# Patient Record
Sex: Male | Born: 1950
Health system: Southern US, Community
[De-identification: ages and names within clinical notes are randomized; demographics above are authoritative.]

## PROBLEM LIST (undated history)

## (undated) DIAGNOSIS — Z87442 Personal history of urinary calculi: Secondary | ICD-10-CM

## (undated) DIAGNOSIS — Z860100 Personal history of colon polyps, unspecified: Secondary | ICD-10-CM

## (undated) DIAGNOSIS — Z8709 Personal history of other diseases of the respiratory system: Secondary | ICD-10-CM

## (undated) DIAGNOSIS — M1990 Unspecified osteoarthritis, unspecified site: Secondary | ICD-10-CM

## (undated) DIAGNOSIS — G8929 Other chronic pain: Secondary | ICD-10-CM

## (undated) DIAGNOSIS — M549 Dorsalgia, unspecified: Secondary | ICD-10-CM

## (undated) DIAGNOSIS — F419 Anxiety disorder, unspecified: Secondary | ICD-10-CM

## (undated) DIAGNOSIS — I10 Essential (primary) hypertension: Secondary | ICD-10-CM

## (undated) DIAGNOSIS — E785 Hyperlipidemia, unspecified: Secondary | ICD-10-CM

## (undated) DIAGNOSIS — Z9889 Other specified postprocedural states: Secondary | ICD-10-CM

## (undated) DIAGNOSIS — R112 Nausea with vomiting, unspecified: Secondary | ICD-10-CM

## (undated) DIAGNOSIS — R972 Elevated prostate specific antigen [PSA]: Secondary | ICD-10-CM

## (undated) DIAGNOSIS — C61 Malignant neoplasm of prostate: Secondary | ICD-10-CM

## (undated) HISTORY — PX: KNEE ARTHROSCOPY W/ ACL RECONSTRUCTION: SHX1858

## (undated) HISTORY — PX: SHOULDER SURGERY: SHX246

## (undated) HISTORY — PX: TONSILLECTOMY: SUR1361

## (undated) HISTORY — PX: COLONOSCOPY: SHX174

## (undated) HISTORY — PX: KNEE ARTHROSCOPY: SUR90

## (undated) HISTORY — PX: OTHER SURGICAL HISTORY: SHX169

## (undated) HISTORY — PX: COLONOSCOPY W/ POLYPECTOMY: SHX1380

---

## 2002-10-08 ENCOUNTER — Encounter: Payer: Self-pay | Admitting: Occupational Medicine

## 2002-10-08 ENCOUNTER — Encounter: Admission: RE | Admit: 2002-10-08 | Discharge: 2002-10-08 | Payer: Self-pay | Admitting: Occupational Medicine

## 2006-01-15 ENCOUNTER — Ambulatory Visit: Payer: Self-pay | Admitting: Orthopaedic Surgery

## 2006-02-28 ENCOUNTER — Ambulatory Visit: Payer: Self-pay | Admitting: Orthopaedic Surgery

## 2007-07-15 ENCOUNTER — Ambulatory Visit: Payer: Self-pay | Admitting: Gastroenterology

## 2012-06-16 ENCOUNTER — Emergency Department: Payer: Self-pay | Admitting: Unknown Physician Specialty

## 2012-06-16 LAB — URINALYSIS, COMPLETE
Blood: NEGATIVE
Leukocyte Esterase: NEGATIVE
Nitrite: NEGATIVE
Protein: NEGATIVE
Specific Gravity: 1.011 (ref 1.003–1.030)

## 2012-06-16 LAB — COMPREHENSIVE METABOLIC PANEL
Albumin: 4 g/dL (ref 3.4–5.0)
Alkaline Phosphatase: 86 U/L (ref 50–136)
Anion Gap: 9 (ref 7–16)
BUN: 15 mg/dL (ref 7–18)
Co2: 25 mmol/L (ref 21–32)
Glucose: 103 mg/dL — ABNORMAL HIGH (ref 65–99)
Osmolality: 280 (ref 275–301)
Potassium: 3.8 mmol/L (ref 3.5–5.1)
SGOT(AST): 30 U/L (ref 15–37)
Sodium: 140 mmol/L (ref 136–145)
Total Protein: 7.4 g/dL (ref 6.4–8.2)

## 2012-06-16 LAB — CBC
HCT: 44.6 % (ref 40.0–52.0)
HGB: 14.8 g/dL (ref 13.0–18.0)
MCHC: 33.1 g/dL (ref 32.0–36.0)
RBC: 4.79 10*6/uL (ref 4.40–5.90)
RDW: 13.3 % (ref 11.5–14.5)
WBC: 5.6 10*3/uL (ref 3.8–10.6)

## 2012-06-16 LAB — TROPONIN I: Troponin-I: <0.02 ng/mL

## 2012-08-05 ENCOUNTER — Encounter (HOSPITAL_COMMUNITY): Payer: Self-pay | Admitting: Pharmacy Technician

## 2012-08-05 ENCOUNTER — Other Ambulatory Visit: Payer: Self-pay | Admitting: Neurosurgery

## 2012-08-08 ENCOUNTER — Encounter (HOSPITAL_COMMUNITY)
Admission: RE | Admit: 2012-08-08 | Discharge: 2012-08-08 | Disposition: A | Discharge status: Home / Self Care | Payer: No Typology Code available for payment source | Source: Ambulatory Visit | Attending: Neurosurgery | Admitting: Neurosurgery

## 2012-08-08 ENCOUNTER — Ambulatory Visit (HOSPITAL_COMMUNITY)
Admission: RE | Admit: 2012-08-08 | Discharge: 2012-08-08 | Disposition: A | Discharge status: Home / Self Care | Payer: No Typology Code available for payment source | Source: Ambulatory Visit | Attending: Neurosurgery | Admitting: Neurosurgery

## 2012-08-08 ENCOUNTER — Encounter (HOSPITAL_COMMUNITY): Payer: Self-pay

## 2012-08-08 DIAGNOSIS — I10 Essential (primary) hypertension: Secondary | ICD-10-CM | POA: Insufficient documentation

## 2012-08-08 DIAGNOSIS — Z01812 Encounter for preprocedural laboratory examination: Secondary | ICD-10-CM | POA: Insufficient documentation

## 2012-08-08 DIAGNOSIS — Z01810 Encounter for preprocedural cardiovascular examination: Secondary | ICD-10-CM | POA: Insufficient documentation

## 2012-08-08 DIAGNOSIS — E785 Hyperlipidemia, unspecified: Secondary | ICD-10-CM | POA: Insufficient documentation

## 2012-08-08 DIAGNOSIS — Z01818 Encounter for other preprocedural examination: Secondary | ICD-10-CM | POA: Insufficient documentation

## 2012-08-08 HISTORY — DX: Other chronic pain: G89.29

## 2012-08-08 HISTORY — DX: Nausea with vomiting, unspecified: R11.2

## 2012-08-08 HISTORY — DX: Personal history of colonic polyps: Z86.010

## 2012-08-08 HISTORY — DX: Unspecified osteoarthritis, unspecified site: M19.90

## 2012-08-08 HISTORY — DX: Personal history of urinary calculi: Z87.442

## 2012-08-08 HISTORY — DX: Personal history of colon polyps, unspecified: Z86.0100

## 2012-08-08 HISTORY — DX: Dorsalgia, unspecified: M54.9

## 2012-08-08 HISTORY — DX: Personal history of other diseases of the respiratory system: Z87.09

## 2012-08-08 HISTORY — DX: Essential (primary) hypertension: I10

## 2012-08-08 HISTORY — DX: Hyperlipidemia, unspecified: E78.5

## 2012-08-08 HISTORY — DX: Other specified postprocedural states: Z98.890

## 2012-08-08 LAB — CBC
HCT: 43.4 % (ref 39.0–52.0)
Hemoglobin: 14.9 g/dL (ref 13.0–17.0)
MCH: 30.8 pg (ref 26.0–34.0)
MCHC: 34.3 g/dL (ref 30.0–36.0)
MCV: 89.7 fL (ref 78.0–100.0)
Platelets: 194 10*3/uL (ref 150–400)
RBC: 4.84 MIL/uL (ref 4.22–5.81)
RDW: 13.4 % (ref 11.5–15.5)
WBC: 5.9 10*3/uL (ref 4.0–10.5)

## 2012-08-08 LAB — BASIC METABOLIC PANEL
BUN: 17 mg/dL (ref 6–23)
CO2: 27 mEq/L (ref 19–32)
Calcium: 9.7 mg/dL (ref 8.4–10.5)
Chloride: 104 mEq/L (ref 96–112)
Creatinine, Ser: 0.62 mg/dL (ref 0.50–1.35)
GFR calc Af Amer: >90 mL/min (ref >90)
GFR calc non Af Amer: >90 mL/min (ref >90)
Glucose, Bld: 87 mg/dL (ref 70–99)
Potassium: 3.7 mEq/L (ref 3.5–5.1)
Sodium: 142 mEq/L (ref 135–145)

## 2012-08-08 LAB — SURGICAL PCR SCREEN
MRSA, PCR: NEGATIVE
Staphylococcus aureus: POSITIVE — AB

## 2012-08-08 NOTE — Pre-Procedure Instructions (Signed)
20 Ronald Hunter  08/08/2012   Your procedure is scheduled on:  Mon, Aug 26 @ 7:30 AM  Report to [facility] at 5:30 AM.  Call this number if you have problems the morning of surgery: [phone]   Remember:   Do not eat food:After Midnight.  Take these medicines the morning of surgery with A SIP OF WATER: Metoprolol(Toprol)   Do not wear jewelry  Do not wear lotions, powders, or colognes.  Men may shave face and neck.  Do not bring valuables to the hospital.  Contacts, dentures or bridgework may not be worn into surgery.  Leave suitcase in the car. After surgery it may be brought to your room.  For patients admitted to the hospital, checkout time is 11:00 AM the day of discharge.   Patients discharged the day of surgery will not be allowed to drive home.    Special Instructions: CHG Shower Use Special Wash: 1/2 bottle night before surgery and 1/2 bottle morning of surgery.   Please read over the following fact sheets that you were given: Pain Booklet, Coughing and Deep Breathing, MRSA Information and Surgical Site Infection Prevention

## 2012-08-08 NOTE — Progress Notes (Signed)
Pt doesn't have a cardiologist  Denies ever having a stress test/echo/heart cath  Medical MD is Dr.Samuel Moriarty in [place]  No recent ekg or cxr

## 2012-08-10 MED ORDER — CEFAZOLIN SODIUM-DEXTROSE 2-3 GM-% IV SOLR
2.0000 g | INTRAVENOUS | Status: AC
  Administered 2012-08-11: 2 g via INTRAVENOUS
  Filled 2012-08-10: qty 50

## 2012-08-11 ENCOUNTER — Encounter (HOSPITAL_COMMUNITY)
Admission: RE | Disposition: A | Discharge status: Home / Self Care | Payer: Self-pay | Source: Ambulatory Visit | Attending: Neurosurgery

## 2012-08-11 ENCOUNTER — Encounter (HOSPITAL_COMMUNITY): Payer: Self-pay | Admitting: Certified Registered"

## 2012-08-11 ENCOUNTER — Encounter (HOSPITAL_COMMUNITY): Payer: Self-pay | Admitting: *Deleted

## 2012-08-11 ENCOUNTER — Ambulatory Visit (HOSPITAL_COMMUNITY): Payer: No Typology Code available for payment source | Admitting: Certified Registered"

## 2012-08-11 ENCOUNTER — Ambulatory Visit (HOSPITAL_COMMUNITY): Payer: No Typology Code available for payment source

## 2012-08-11 ENCOUNTER — Ambulatory Visit (HOSPITAL_COMMUNITY)
Admission: RE | Admit: 2012-08-11 | Discharge: 2012-08-12 | Disposition: A | Discharge status: Home / Self Care | Payer: No Typology Code available for payment source | Source: Ambulatory Visit | Attending: Neurosurgery | Admitting: Neurosurgery

## 2012-08-11 DIAGNOSIS — E785 Hyperlipidemia, unspecified: Secondary | ICD-10-CM | POA: Insufficient documentation

## 2012-08-11 DIAGNOSIS — Z79899 Other long term (current) drug therapy: Secondary | ICD-10-CM | POA: Insufficient documentation

## 2012-08-11 DIAGNOSIS — I10 Essential (primary) hypertension: Secondary | ICD-10-CM | POA: Insufficient documentation

## 2012-08-11 DIAGNOSIS — M47817 Spondylosis without myelopathy or radiculopathy, lumbosacral region: Secondary | ICD-10-CM | POA: Insufficient documentation

## 2012-08-11 DIAGNOSIS — M5126 Other intervertebral disc displacement, lumbar region: Secondary | ICD-10-CM | POA: Insufficient documentation

## 2012-08-11 DIAGNOSIS — G8929 Other chronic pain: Secondary | ICD-10-CM | POA: Insufficient documentation

## 2012-08-11 HISTORY — PX: LUMBAR LAMINECTOMY/DECOMPRESSION MICRODISCECTOMY: SHX5026

## 2012-08-11 SURGERY — LUMBAR LAMINECTOMY/DECOMPRESSION MICRODISCECTOMY 1 LEVEL
Anesthesia: General | Site: Spine Lumbar | Laterality: Right | Wound class: Clean

## 2012-08-11 MED ORDER — BISACODYL 10 MG RE SUPP: 10.0000 mg | Freq: Every day | RECTAL | Status: DC | PRN

## 2012-08-11 MED ORDER — ROCURONIUM BROMIDE 100 MG/10ML IV SOLN
INTRAVENOUS | Status: DC | PRN
  Administered 2012-08-11: 10 mg via INTRAVENOUS
  Administered 2012-08-11: 50 mg via INTRAVENOUS

## 2012-08-11 MED ORDER — MAGNESIUM HYDROXIDE 400 MG/5ML PO SUSP: 30.0000 mL | Freq: Every day | ORAL | Status: DC | PRN

## 2012-08-11 MED ORDER — METOPROLOL SUCCINATE ER 50 MG PO TB24
50.0000 mg | ORAL_TABLET | Freq: Every day | ORAL | Status: DC
  Administered 2012-08-11: 50 mg via ORAL
  Filled 2012-08-11 (×2): qty 1

## 2012-08-11 MED ORDER — KETOROLAC TROMETHAMINE 30 MG/ML IJ SOLN
30.0000 mg | Freq: Four times a day (QID) | INTRAMUSCULAR | Status: DC
  Administered 2012-08-11 – 2012-08-12 (×4): 30 mg via INTRAVENOUS
  Filled 2012-08-11 (×8): qty 1

## 2012-08-11 MED ORDER — OXYCODONE HCL 5 MG PO TABS: 5.0000 mg | ORAL_TABLET | Freq: Once | ORAL | Status: DC | PRN

## 2012-08-11 MED ORDER — PHENOL 1.4 % MT LIQD: 1.0000 | OROMUCOSAL | Status: DC | PRN

## 2012-08-11 MED ORDER — FENTANYL CITRATE 0.05 MG/ML IJ SOLN
INTRAMUSCULAR | Status: DC | PRN
  Administered 2012-08-11: 100 ug via INTRAVENOUS
  Administered 2012-08-11 (×2): 50 ug via INTRAVENOUS

## 2012-08-11 MED ORDER — OXYCODONE HCL 5 MG PO TABS: 5.0000 mg | ORAL_TABLET | ORAL | Status: DC | PRN

## 2012-08-11 MED ORDER — ACETAMINOPHEN 325 MG PO TABS: 650.0000 mg | ORAL_TABLET | ORAL | Status: DC | PRN

## 2012-08-11 MED ORDER — BACITRACIN 50000 UNITS IM SOLR
INTRAMUSCULAR | Status: AC
  Filled 2012-08-11: qty 1

## 2012-08-11 MED ORDER — GLYCOPYRROLATE 0.2 MG/ML IJ SOLN
INTRAMUSCULAR | Status: DC | PRN
  Administered 2012-08-11: 0.4 mg via INTRAVENOUS

## 2012-08-11 MED ORDER — ATORVASTATIN CALCIUM 40 MG PO TABS
40.0000 mg | ORAL_TABLET | Freq: Every day | ORAL | Status: DC
  Administered 2012-08-11: 40 mg via ORAL
  Filled 2012-08-11 (×2): qty 1

## 2012-08-11 MED ORDER — ACETAMINOPHEN 10 MG/ML IV SOLN
INTRAVENOUS | Status: AC
  Administered 2012-08-11: 1000 mg via INTRAVENOUS
  Filled 2012-08-11: qty 100

## 2012-08-11 MED ORDER — HEMOSTATIC AGENTS (NO CHARGE) OPTIME
TOPICAL | Status: DC | PRN
  Administered 2012-08-11: 1 via TOPICAL

## 2012-08-11 MED ORDER — 0.9 % SODIUM CHLORIDE (POUR BTL) OPTIME
TOPICAL | Status: DC | PRN
  Administered 2012-08-11: 1000 mL

## 2012-08-11 MED ORDER — KCL IN DEXTROSE-NACL 20-5-0.45 MEQ/L-%-% IV SOLN
INTRAVENOUS | Status: DC
  Filled 2012-08-11 (×5): qty 1000

## 2012-08-11 MED ORDER — NEOSTIGMINE METHYLSULFATE 1 MG/ML IJ SOLN
INTRAMUSCULAR | Status: DC | PRN
  Administered 2012-08-11: 4 mg via INTRAVENOUS

## 2012-08-11 MED ORDER — HYDROMORPHONE HCL PF 1 MG/ML IJ SOLN
0.2500 mg | INTRAMUSCULAR | Status: DC | PRN
  Administered 2012-08-11: 0.5 mg via INTRAVENOUS

## 2012-08-11 MED ORDER — HYDROCODONE-ACETAMINOPHEN 5-325 MG PO TABS: 1.0000 | ORAL_TABLET | ORAL | Status: DC | PRN

## 2012-08-11 MED ORDER — SODIUM CHLORIDE 0.9 % IV SOLN: 250.0000 mL | INTRAVENOUS | Status: DC

## 2012-08-11 MED ORDER — ACETAMINOPHEN 650 MG RE SUPP: 650.0000 mg | RECTAL | Status: DC | PRN

## 2012-08-11 MED ORDER — KETOROLAC TROMETHAMINE 30 MG/ML IJ SOLN
30.0000 mg | Freq: Once | INTRAMUSCULAR | Status: AC
  Administered 2012-08-11: 30 mg via INTRAVENOUS

## 2012-08-11 MED ORDER — SODIUM CHLORIDE 0.9 % IJ SOLN: 3.0000 mL | INTRAMUSCULAR | Status: DC | PRN

## 2012-08-11 MED ORDER — SODIUM CHLORIDE 0.9 % IR SOLN
Status: DC | PRN
  Administered 2012-08-11: 08:00:00

## 2012-08-11 MED ORDER — LIDOCAINE-EPINEPHRINE 1 %-1:100000 IJ SOLN
INTRAMUSCULAR | Status: DC | PRN
  Administered 2012-08-11: 12.5 mL

## 2012-08-11 MED ORDER — LACTATED RINGERS IV SOLN
INTRAVENOUS | Status: DC | PRN
  Administered 2012-08-11 (×2): via INTRAVENOUS

## 2012-08-11 MED ORDER — HYDROMORPHONE HCL PF 1 MG/ML IJ SOLN
INTRAMUSCULAR | Status: AC
  Filled 2012-08-11: qty 1

## 2012-08-11 MED ORDER — EPHEDRINE SULFATE 50 MG/ML IJ SOLN
INTRAMUSCULAR | Status: DC | PRN
  Administered 2012-08-11: 5 mg via INTRAVENOUS

## 2012-08-11 MED ORDER — SODIUM CHLORIDE 0.9 % IJ SOLN
3.0000 mL | Freq: Two times a day (BID) | INTRAMUSCULAR | Status: DC
  Administered 2012-08-11: 3 mL via INTRAVENOUS

## 2012-08-11 MED ORDER — FENTANYL CITRATE 0.05 MG/ML IJ SOLN
INTRAMUSCULAR | Status: AC
  Filled 2012-08-11: qty 2

## 2012-08-11 MED ORDER — DROPERIDOL 2.5 MG/ML IJ SOLN: 0.6250 mg | INTRAMUSCULAR | Status: DC | PRN

## 2012-08-11 MED ORDER — MIDAZOLAM HCL 5 MG/5ML IJ SOLN
INTRAMUSCULAR | Status: DC | PRN
  Administered 2012-08-11: 2 mg via INTRAVENOUS

## 2012-08-11 MED ORDER — FENTANYL CITRATE 0.05 MG/ML IJ SOLN
INTRAMUSCULAR | Status: DC | PRN
  Administered 2012-08-11: 50 ug via INTRAVENOUS

## 2012-08-11 MED ORDER — SODIUM CHLORIDE 0.9 % IV SOLN
INTRAVENOUS | Status: AC
  Filled 2012-08-11: qty 500

## 2012-08-11 MED ORDER — METHYLPREDNISOLONE ACETATE 80 MG/ML IJ SUSP
INTRAMUSCULAR | Status: DC | PRN
  Administered 2012-08-11: 80 mg

## 2012-08-11 MED ORDER — PROPOFOL 10 MG/ML IV EMUL
INTRAVENOUS | Status: DC | PRN
  Administered 2012-08-11: 200 mg via INTRAVENOUS

## 2012-08-11 MED ORDER — ALUM & MAG HYDROXIDE-SIMETH 200-200-20 MG/5ML PO SUSP: 30.0000 mL | Freq: Four times a day (QID) | ORAL | Status: DC | PRN

## 2012-08-11 MED ORDER — OXYCODONE-ACETAMINOPHEN 5-325 MG PO TABS: 1.0000 | ORAL_TABLET | ORAL | Status: DC | PRN

## 2012-08-11 MED ORDER — CYCLOBENZAPRINE HCL 10 MG PO TABS: 10.0000 mg | ORAL_TABLET | Freq: Three times a day (TID) | ORAL | Status: DC | PRN

## 2012-08-11 MED ORDER — THROMBIN 5000 UNITS EX KIT
PACK | CUTANEOUS | Status: DC | PRN
  Administered 2012-08-11: 5000 [IU] via TOPICAL

## 2012-08-11 MED ORDER — ZOLPIDEM TARTRATE 5 MG PO TABS: 10.0000 mg | ORAL_TABLET | Freq: Every evening | ORAL | Status: DC | PRN

## 2012-08-11 MED ORDER — HYDROXYZINE HCL 25 MG PO TABS: 50.0000 mg | ORAL_TABLET | ORAL | Status: DC | PRN

## 2012-08-11 MED ORDER — BUPIVACAINE HCL (PF) 0.5 % IJ SOLN
INTRAMUSCULAR | Status: DC | PRN
  Administered 2012-08-11: 12.5 mL

## 2012-08-11 MED ORDER — ACETAMINOPHEN 10 MG/ML IV SOLN
1000.0000 mg | Freq: Four times a day (QID) | INTRAVENOUS | Status: DC
  Administered 2012-08-11 – 2012-08-12 (×3): 1000 mg via INTRAVENOUS
  Filled 2012-08-11 (×4): qty 100

## 2012-08-11 MED ORDER — HYDROXYZINE HCL 50 MG/ML IM SOLN: 50.0000 mg | INTRAMUSCULAR | Status: DC | PRN

## 2012-08-11 MED ORDER — MENTHOL 3 MG MT LOZG: 1.0000 | LOZENGE | OROMUCOSAL | Status: DC | PRN

## 2012-08-11 MED ORDER — MORPHINE SULFATE 4 MG/ML IJ SOLN: 4.0000 mg | INTRAMUSCULAR | Status: DC | PRN

## 2012-08-11 MED ORDER — OXYCODONE HCL 5 MG/5ML PO SOLN: 5.0000 mg | Freq: Once | ORAL | Status: DC | PRN

## 2012-08-11 MED ORDER — KETOROLAC TROMETHAMINE 30 MG/ML IJ SOLN
INTRAMUSCULAR | Status: AC
  Filled 2012-08-11: qty 1

## 2012-08-11 MED ORDER — LIDOCAINE HCL (CARDIAC) 20 MG/ML IV SOLN
INTRAVENOUS | Status: DC | PRN
  Administered 2012-08-11: 100 mg via INTRAVENOUS

## 2012-08-11 MED ORDER — ONDANSETRON HCL 4 MG/2ML IJ SOLN
INTRAMUSCULAR | Status: DC | PRN
  Administered 2012-08-11: 4 mg via INTRAVENOUS

## 2012-08-11 SURGICAL SUPPLY — 57 items
ADH SKN CLS APL DERMABOND .7 (GAUZE/BANDAGES/DRESSINGS) ×1
APL SKNCLS STERI-STRIP NONHPOA (GAUZE/BANDAGES/DRESSINGS)
BAG DECANTER FOR FLEXI CONT (MISCELLANEOUS) ×2 IMPLANT
BENZOIN TINCTURE PRP APPL 2/3 (GAUZE/BANDAGES/DRESSINGS) IMPLANT
BLADE SURG ROTATE 9660 (MISCELLANEOUS) ×1 IMPLANT
BRUSH SCRUB EZ PLAIN DRY (MISCELLANEOUS) ×2 IMPLANT
BUR ACORN 6.0 ACORN (BURR) IMPLANT
BUR ACRON 5.0MM COATED (BURR) ×1 IMPLANT
BUR MATCHSTICK NEURO 3.0 LAGG (BURR) ×2 IMPLANT
CANISTER SUCTION 2500CC (MISCELLANEOUS) ×2 IMPLANT
CLOTH BEACON ORANGE TIMEOUT ST (SAFETY) ×2 IMPLANT
CONT SPEC 4OZ CLIKSEAL STRL BL (MISCELLANEOUS) IMPLANT
DERMABOND ADVANCED (GAUZE/BANDAGES/DRESSINGS) ×1
DERMABOND ADVANCED .7 DNX12 (GAUZE/BANDAGES/DRESSINGS) IMPLANT
DRAPE LAPAROTOMY 100X72X124 (DRAPES) ×2 IMPLANT
DRAPE MICROSCOPE LEICA (MISCELLANEOUS) ×2 IMPLANT
DRAPE POUCH INSTRU U-SHP 10X18 (DRAPES) ×2 IMPLANT
DRAPE PROXIMA HALF (DRAPES) ×1 IMPLANT
DRSG EMULSION OIL 3X3 NADH (GAUZE/BANDAGES/DRESSINGS) IMPLANT
ELECT REM PT RETURN 9FT ADLT (ELECTROSURGICAL) ×2
ELECTRODE REM PT RTRN 9FT ADLT (ELECTROSURGICAL) ×1 IMPLANT
GAUZE SPONGE 4X4 16PLY XRAY LF (GAUZE/BANDAGES/DRESSINGS) IMPLANT
GLOVE BIOGEL PI IND STRL 8 (GLOVE) ×1 IMPLANT
GLOVE BIOGEL PI INDICATOR 8 (GLOVE) ×1
GLOVE ECLIPSE 7.5 STRL STRAW (GLOVE) ×2 IMPLANT
GLOVE EXAM NITRILE LRG STRL (GLOVE) IMPLANT
GLOVE EXAM NITRILE MD LF STRL (GLOVE) IMPLANT
GLOVE EXAM NITRILE XL STR (GLOVE) IMPLANT
GLOVE EXAM NITRILE XS STR PU (GLOVE) IMPLANT
GOWN BRE IMP SLV AUR LG STRL (GOWN DISPOSABLE) ×2 IMPLANT
GOWN BRE IMP SLV AUR XL STRL (GOWN DISPOSABLE) IMPLANT
GOWN STRL REIN 2XL LVL4 (GOWN DISPOSABLE) IMPLANT
KIT BASIN OR (CUSTOM PROCEDURE TRAY) ×2 IMPLANT
KIT ROOM TURNOVER OR (KITS) ×2 IMPLANT
NDL HYPO 18GX1.5 BLUNT FILL (NEEDLE) IMPLANT
NDL SPNL 18GX3.5 QUINCKE PK (NEEDLE) ×1 IMPLANT
NEEDLE HYPO 18GX1.5 BLUNT FILL (NEEDLE) ×2 IMPLANT
NEEDLE SPNL 18GX3.5 QUINCKE PK (NEEDLE) ×2 IMPLANT
NS IRRIG 1000ML POUR BTL (IV SOLUTION) ×2 IMPLANT
PACK LAMINECTOMY NEURO (CUSTOM PROCEDURE TRAY) ×2 IMPLANT
PAD ARMBOARD 7.5X6 YLW CONV (MISCELLANEOUS) ×6 IMPLANT
PATTIES SURGICAL .5 X1 (DISPOSABLE) ×2 IMPLANT
RUBBERBAND STERILE (MISCELLANEOUS) ×4 IMPLANT
SPONGE GAUZE 4X4 12PLY (GAUZE/BANDAGES/DRESSINGS) IMPLANT
SPONGE LAP 4X18 X RAY DECT (DISPOSABLE) IMPLANT
SPONGE SURGIFOAM ABS GEL SZ50 (HEMOSTASIS) ×2 IMPLANT
STRIP CLOSURE SKIN 1/2X4 (GAUZE/BANDAGES/DRESSINGS) IMPLANT
SUT PROLENE 6 0 BV (SUTURE) IMPLANT
SUT VIC AB 1 CT1 18XBRD ANBCTR (SUTURE) ×1 IMPLANT
SUT VIC AB 1 CT1 8-18 (SUTURE) ×2
SUT VIC AB 2-0 CP2 18 (SUTURE) ×2 IMPLANT
SUT VIC AB 3-0 SH 8-18 (SUTURE) IMPLANT
SYR 20CC LL (SYRINGE) ×2 IMPLANT
SYR 5ML LL (SYRINGE) ×1 IMPLANT
TOWEL OR 17X24 6PK STRL BLUE (TOWEL DISPOSABLE) ×2 IMPLANT
TOWEL OR 17X26 10 PK STRL BLUE (TOWEL DISPOSABLE) ×2 IMPLANT
WATER STERILE IRR 1000ML POUR (IV SOLUTION) ×2 IMPLANT

## 2012-08-11 NOTE — Op Note (Signed)
08/11/2012  9:52 AM  PATIENT:  Ronald Hunter  61 y.o. male  PRE-OPERATIVE DIAGNOSIS:  Right L4-5 lumbar herniated disc lumbar stenosis lumbar spondylosis  POST-OPERATIVE DIAGNOSIS:  Right L4-5 lumbar herniated disc lumbar, stenosis, lumbar spondylosis  PROCEDURE:  Procedure(s): LUMBAR LAMINECTOMY/DECOMPRESSION MICRODISCECTOMY 1 LEVEL: Right L4-5 lumbar laminotomy and microdiscectomy, with microdissection, microsurgical technique, and the operating microscope  SURGEON:  Surgeon(s): Hewitt Shorts, MD  ANESTHESIA:   general  EBL:  Total I/O In: 1000 [I.V.:1000] Out: 50 [Blood:50]  BLOOD ADMINISTERED:none  COUNT: Correct per nursing staff  DICTATION: Patient was brought to the operating room and placed under general endotracheal anesthesia. Patient was turned to prone position the lumbar region was prepped with Betadine soap and solution and draped in a sterile fashion. The midline was infiltrated with local anesthetic with epinephrine. A localizing x-ray was taken and the L4-5 level was identified. Midline incision was made over the L4-5 level and was carried down through the subcutaneous tissue to the lumbar fascia. The lumbar fascia was incised on the right side and the paraspinal muscles were dissected from the spinous processes and lamina in a subperiosteal fashion. Another x-ray was taken and the L4-5 intralaminar space was identified. The operating microscope was draped and brought into the field provided additional magnification, illumination, and visualization. Laminotomy was performed using the high-speed drill and Kerrison punches. The ligamentum flavum was carefully resected. The underlying thecal sac and nerve root were identified. We explored the epidural space, and initially did not find a free fragment. Another x-ray was taken and again we confirmed the localization at the L4-5 level. However we did find subligamentous disc protrusion, and the annulus was incised, and the disc  space entered, and a thorough discectomy was performed using a variety of pituitary rongeurs and micro-curettes. We then further explored the epidural space. We found and carried to the ventral surface of the thecal sac, a layer of scar tissue. We carefully dissected this free from the thecal sac, and embedded within it was disc herniation, that represented a large disc herniation that we saw on the patient's MRI. This disc herniation and embedded within scar was fully removed, and good decompression of the thecal sac and nerve roots was achieved. Once the discectomy was completed and good decompression of the thecal sac and nerve had been achieved hemostasis was established with the use of bipolar cautery and Gelfoam with thrombin. The Gelfoam was removed and hemostasis confirmed. We then instilled 2 cc of fentanyl and 80 mg of Depo-Medrol into the epidural space. Deep fascia was closed with interrupted undyed 1 Vicryl sutures. Scarpa's fascia was closed with interrupted undyed 1 Vicryl sutures in the subcutaneous and subcuticular layer were closed with interrupted inverted 2-0 undyed Vicryl sutures. The skin edges were approximated with Dermabond. Following surgery the patient was turned back to a supine position to be reversed from the anesthetic extubated and transferred to the recovery room for further care.   PLAN OF CARE: Admit for overnight observation  PATIENT DISPOSITION:  PACU - hemodynamically stable.   Delay start of Pharmacological VTE agent (>24hrs) due to surgical blood loss or risk of bleeding:  yes

## 2012-08-11 NOTE — Progress Notes (Signed)
Filed Vitals:   08/11/12 1114 08/11/12 1115 08/11/12 1142 08/11/12 1610  BP: 135/70  171/80 160/82  Pulse: 51 51 63 72  Temp:  97.6 F (36.4 C) 97.5 F (36.4 C) 97.8 F (36.6 C)  TempSrc:      Resp: 15 12 16 16   SpO2: 100% 100% 97% 95%    Patient resting in bed. Is up and ambulated 3 times in the halls. Voiding well. Wound clean and dry. Moving all 4 extremities well.  Plan: Continue progress to postoperative recovery.  Hewitt Shorts, MD 08/11/2012, 6:48 PM

## 2012-08-11 NOTE — Transfer of Care (Signed)
Immediate Anesthesia Transfer of Care Note  Patient: Ronald Hunter  Procedure(s) Performed: Procedure(s) (LRB): LUMBAR LAMINECTOMY/DECOMPRESSION MICRODISCECTOMY 1 LEVEL (Right)  Patient Location: PACU  Anesthesia Type: General  Level of Consciousness: awake, alert  and oriented  Airway & Oxygen Therapy: Patient Spontanous Breathing and Patient connected to nasal cannula oxygen  Post-op Assessment: Report given to PACU RN, Post -op Vital signs reviewed and stable and Patient moving all extremities X 4  Post vital signs: Reviewed and stable  Complications: No apparent anesthesia complications

## 2012-08-11 NOTE — Anesthesia Preprocedure Evaluation (Addendum)
Anesthesia Evaluation  Patient identified by MRN, date of birth, ID band Patient awake    Reviewed: Allergy & Precautions, H&P , NPO status , Patient's Chart, lab work & pertinent test results  History of Anesthesia Complications (+) PONVNegative for: history of anesthetic complications  Airway Mallampati: I TM Distance: >3 FB Neck ROM: full    Dental  (+) Teeth Intact and Dental Advidsory Given   Pulmonary neg pulmonary ROS,  breath sounds clear to auscultation  Pulmonary exam normal       Cardiovascular hypertension, On Home Beta Blockers Rhythm:regular Rate:Normal     Neuro/Psych    GI/Hepatic negative GI ROS, Neg liver ROS,   Endo/Other  negative endocrine ROS  Renal/GU negative Renal ROS     Musculoskeletal   Abdominal Normal abdominal exam  (+)   Peds  Hematology negative hematology ROS (+)   Anesthesia Other Findings   Reproductive/Obstetrics                          Anesthesia Physical Anesthesia Plan  ASA: II  Anesthesia Plan: General ETT   Post-op Pain Management:    Induction:   Airway Management Planned:   Additional Equipment:   Intra-op Plan:   Post-operative Plan:   Informed Consent: I have reviewed the patients History and Physical, chart, labs and discussed the procedure including the risks, benefits and alternatives for the proposed anesthesia with the patient or authorized representative who has indicated his/her understanding and acceptance.   Dental Advisory Given  Plan Discussed with: Anesthesiologist, CRNA and Surgeon  Anesthesia Plan Comments:         Anesthesia Quick Evaluation

## 2012-08-11 NOTE — H&P (Signed)
Subjective: Patient is a 61 y.o. male who is admitted for treatment of right radiculopathy the couple months ago. Since his began at the end of June, associated with pain, numbness, and paresthesias into the right buttock posterior thigh and posterolateral leg. Patient treated symptomatically without relief. We evaluated with MRI scan that revealed a large right L4-5 disc herniation, and is admitted now for a right L4-5 lumbar laminotomy microdiscectomy.   Past Medical History  Diagnosis Date  . PONV (postoperative nausea and vomiting)   . Hypertension     takes Metoprolol daily  . Hyperlipidemia     takes Crestor daily  . History of bronchitis     as a child  . Chronic back pain     stenosis/spondylosis  . Arthritis   . History of colon polyps   . History of kidney stones     Past Surgical History  Procedure Date  . Tonsillectomy   . Cyst removed from under left arm   . Knee arthroscopy     right   . Shoulder surgery     right   . Colonoscopy     Prescriptions prior to admission  Medication Sig Dispense Refill  . aspirin 500 MG EC tablet Take 1,000 mg by mouth daily as needed. For back pain      . metoprolol succinate (TOPROL-XL) 50 MG 24 hr tablet Take 50 mg by mouth daily. Take with or immediately following a meal.      . Multiple Vitamin (MULTIVITAMIN WITH MINERALS) TABS Take 1 tablet by mouth daily.      . rosuvastatin (CRESTOR) 20 MG tablet Take 20 mg by mouth daily.       No Known Allergies  History  Substance Use Topics  . Smoking status: Never Smoker   . Smokeless tobacco: Not on file  . Alcohol Use: Yes     nightly    History reviewed. No pertinent family history.   Review of Systems A comprehensive review of systems was negative.  Objective: Vital signs in last 24 hours: Temp:  [98.3 F (36.8 C)] 98.3 F (36.8 C) (08/26 0604) Pulse Rate:  [65] 65  (08/26 0604) Resp:  [18] 18  (08/26 0604) BP: (170)/(97) 170/97 mmHg (08/26 0604) SpO2:  [100 %] 100 %  (08/26 0604)  EXAM: Patient well-developed well-nourished white male in no acute distress. Lungs are clear to auscultation , the patient has symmetrical respiratory excursion. Heart has a regular rate and rhythm normal S1 and S2 no murmur.   Abdomen is soft nontender nondistended bowel sounds are present. Extremity examination shows no clubbing cyanosis or edema. Musculoskeletal examination shows noticed to palpation of the lumbar spinous processes or paralumbar musculature. Motor examination shows 5/5 strength in the lower extremities including the iliopsoas quadriceps dorsiflexor and plantar flexor, and the left EHL, but the right EHL is 2-3/5. Sensation is decreased to pinprick in the lateral aspect of the right leg and entire right foot both medially and laterally. Reflexes areleft quadriceps 2, right quadriceps 1-2, less gastrocnemius one, right gastrocnemius 2. No pathologic reflexes are present. Patient has a normal gait and stance.   Data Review:CBC    Component Value Date/Time   WBC 5.9 08/08/2012 1543   RBC 4.84 08/08/2012 1543   HGB 14.9 08/08/2012 1543   HCT 43.4 08/08/2012 1543   PLT 194 08/08/2012 1543   MCV 89.7 08/08/2012 1543   MCH 30.8 08/08/2012 1543   MCHC 34.3 08/08/2012 1543   RDW 13.4 08/08/2012  1543                          BMET    Component Value Date/Time   NA 142 08/08/2012 1543   K 3.7 08/08/2012 1543   CL 104 08/08/2012 1543   CO2 27 08/08/2012 1543   GLUCOSE 87 08/08/2012 1543   BUN 17 08/08/2012 1543   CREATININE 0.62 08/08/2012 1543   CALCIUM 9.7 08/08/2012 1543   GFRNONAA >90 08/08/2012 1543   GFRAA >90 08/08/2012 1543     Assessment/Plan: Patient with acute right radiculopathy secondary to a right L4-5 lumbar disc herniation. Patient had a for a right L4-5 lumbar laminotomy and microdiscectomy. I've discussed with the patient the nature of his condition, the nature the surgical procedure, the typical length of surgery, hospital stay, and overall recuperation. We  discussed limitations postoperatively. I discussed risks of surgery including risks of infection, bleeding, possibly need for transfusion, the risk of nerve root dysfunction with pain, weakness, numbness, or paresthesias, or risk of dural tear and CSF leakage and possible need for further surgery, the risk of recurrent disc herniation and the possible need for further surgery, and the risk of anesthetic complications including myocardial infarction, stroke, pneumonia, and death. Understanding all this the patient does wish to proceed with surgery and is admitted for such.    Hewitt Shorts, MD 08/11/2012 7:37 AM

## 2012-08-11 NOTE — Anesthesia Postprocedure Evaluation (Signed)
  Anesthesia Post-op Note  Patient: Ronald Hunter  Procedure(s) Performed: Procedure(s) (LRB): LUMBAR LAMINECTOMY/DECOMPRESSION MICRODISCECTOMY 1 LEVEL (Right)  Patient Location: PACU  Anesthesia Type: General  Level of Consciousness: awake and alert   Airway and Oxygen Therapy: Patient Spontanous Breathing  Post-op Pain: mild  Post-op Assessment: Post-op Vital signs reviewed  Post-op Vital Signs: stable  Complications: No apparent anesthesia complications

## 2012-08-12 ENCOUNTER — Encounter (HOSPITAL_COMMUNITY): Payer: Self-pay | Admitting: Neurosurgery

## 2012-08-12 MED ORDER — HYDROCODONE-ACETAMINOPHEN 5-325 MG PO TABS: 1.0000 | ORAL_TABLET | ORAL | Status: AC | PRN

## 2012-08-12 NOTE — Progress Notes (Signed)
Pt doing very well. Pt OOB ambulating independently and pain is controlled. Pt and wife given D/C instructions with Rx's, both verbalized understanding. Pt D/C'd home via wheelchair @ 1020 per MD order. Emelda Brothers

## 2012-08-12 NOTE — Discharge Summary (Signed)
Physician Discharge Summary  Patient ID: Ronald Hunter MRN: [id number] DOB/AGE: 1951/06/13 61 y.o.  Admit date: 08/11/2012 Discharge date: 08/12/2012  Admission Diagnoses:  Right L4-5 lumbar disc herniation, lumbar degenerative disc disease, lumbar spondylosis, lumbar stenosis, lumbar radiculopathy  Discharge Diagnoses:   Right L4-5 lumbar disc herniation, lumbar degenerative disc disease, lumbar spondylosis, lumbar stenosis, lumbar radiculopathy  Discharged Condition: good  Hospital Course: Patient was admitted, underwent a right L4-5 lumbar laminotomy microdiscectomy. Postoperatively he has done well. He is up and living actively. He is comfortable. His wound is clean and dry. He is being discharged home with instructions regarding wound care and activities, and is to return for followup with me in 3 weeks.  Discharge Exam: Blood pressure 133/86, pulse 63, temperature 98.6 F (37 C), temperature source Oral, resp. rate 16, SpO2 95.00%.  Disposition: Home   Medication List  As of 08/12/2012  8:22 AM   TAKE these medications         aspirin 500 MG EC tablet   Take 1,000 mg by mouth daily as needed. For back pain      HYDROcodone-acetaminophen 5-325 MG per tablet   Commonly known as: NORCO/VICODIN   Take 1-2 tablets by mouth every 4 (four) hours as needed for pain.      metoprolol succinate 50 MG 24 hr tablet   Commonly known as: TOPROL-XL   Take 50 mg by mouth daily. Take with or immediately following a meal.      multivitamin with minerals Tabs   Take 1 tablet by mouth daily.      rosuvastatin 20 MG tablet   Commonly known as: CRESTOR   Take 20 mg by mouth daily.             Signed: Hewitt Shorts, MD 08/12/2012, 8:22 AM

## 2013-02-13 ENCOUNTER — Ambulatory Visit: Payer: Self-pay | Admitting: Orthopedic Surgery

## 2013-03-20 ENCOUNTER — Ambulatory Visit: Payer: Self-pay | Admitting: Orthopedic Surgery

## 2013-09-11 ENCOUNTER — Ambulatory Visit: Payer: Self-pay | Admitting: Gastroenterology

## 2014-11-03 ENCOUNTER — Ambulatory Visit (INDEPENDENT_AMBULATORY_CARE_PROVIDER_SITE_OTHER): Payer: No Typology Code available for payment source

## 2014-11-03 ENCOUNTER — Ambulatory Visit (INDEPENDENT_AMBULATORY_CARE_PROVIDER_SITE_OTHER): Payer: No Typology Code available for payment source | Admitting: Podiatry

## 2014-11-03 ENCOUNTER — Encounter: Payer: Self-pay | Admitting: Podiatry

## 2014-11-03 VITALS — BP 140/85 | HR 62 | Resp 16 | Ht 72.0 in | Wt 205.0 lb

## 2014-11-03 DIAGNOSIS — M722 Plantar fascial fibromatosis: Secondary | ICD-10-CM

## 2014-11-03 MED ORDER — MELOXICAM 15 MG PO TABS: 15.0000 mg | ORAL_TABLET | Freq: Every day | ORAL | Status: DC

## 2014-11-03 MED ORDER — METHYLPREDNISOLONE (PAK) 4 MG PO TABS: ORAL_TABLET | ORAL | Status: DC

## 2014-11-03 NOTE — Progress Notes (Signed)
   Subjective:    Patient ID: Ronald Hunter, male    DOB: 1951/07/16, 63 y.o.   MRN: [id number]  HPI Comments: i think i have plantar fasciitis in both feet. The left one is worse. After sitting down it will hurt. First thing in the mornings are bad. Ive had the pain since early October. i dont do anything for my heel pain.     Review of Systems  Musculoskeletal: Positive for back pain.  All other systems reviewed and are negative.      Objective:   Physical Exam: I have reviewed his past medical history medications allergy surgery social history and review of systems. Pulses are strongly palpable bilateral. Neurologic sensorium is intact per Semmes-Weinstein monofilament. Deep tendon reflexes are intact bilateral muscle strength is 5 over 5 dorsiflexion plantar flexors and inverters and everters all intrinsic musculature is intact. Orthopedic evaluation demonstrates pain on palpation medial tubercle of his left heel. Radiographic evaluation demonstrates soft tissue increase in density at the plantar fascial calcaneal insertion site bilateral. Cutaneous evaluation demonstrates supple well-hydrated cutis probable onychomycosis to the toenails.        Assessment & Plan:  Assessment: Plantar fasciitis left foot.  Plan: Discussed etiology pathology conservative versus surgical therapies. Injected the left heel today. Plantar fascial braces were dispensed bilateral. Night splint was dispensed left foot. Prescriptions were sent over for a Medrol Dosepak to be followed by meloxicam. We discussed appropriate shoe gear stretching exercises ice therapy and shear modifications. We discussed the etiology pathology conservative versus surgical therapies. I will follow-up with him in 1 month.

## 2014-12-01 ENCOUNTER — Ambulatory Visit: Payer: No Typology Code available for payment source | Admitting: Podiatry

## 2014-12-27 ENCOUNTER — Ambulatory Visit: Payer: No Typology Code available for payment source | Admitting: Podiatry

## 2014-12-27 ENCOUNTER — Ambulatory Visit (INDEPENDENT_AMBULATORY_CARE_PROVIDER_SITE_OTHER): Payer: No Typology Code available for payment source | Admitting: Podiatry

## 2014-12-27 VITALS — BP 120/80 | HR 60 | Resp 16

## 2014-12-27 DIAGNOSIS — M722 Plantar fascial fibromatosis: Secondary | ICD-10-CM

## 2014-12-27 NOTE — Progress Notes (Signed)
He presents today for follow-up of his plantar fasciitis. He states that I have good days and bad days in the right when is great.  Objective: Vital signs are stable alert and oriented 3. Pulses are palpable bilateral. No pain on palpation medial calcaneal tubercle bilateral.  Assessment: Well-healing plantar fasciitis bilateral.  Plan: Follow-up with [place] as needed. Continue all conservative therapies.

## 2015-01-27 ENCOUNTER — Ambulatory Visit: Payer: Self-pay | Admitting: Neurosurgery

## 2015-01-28 ENCOUNTER — Encounter (HOSPITAL_COMMUNITY)
Admission: RE | Admit: 2015-01-28 | Discharge: 2015-01-28 | Disposition: A | Discharge status: Home / Self Care | Payer: No Typology Code available for payment source | Source: Ambulatory Visit | Attending: Neurosurgery | Admitting: Neurosurgery

## 2015-01-28 ENCOUNTER — Encounter (HOSPITAL_COMMUNITY): Payer: Self-pay

## 2015-01-28 ENCOUNTER — Other Ambulatory Visit (HOSPITAL_COMMUNITY): Payer: Self-pay | Admitting: Neurosurgery

## 2015-01-28 DIAGNOSIS — M5116 Intervertebral disc disorders with radiculopathy, lumbar region: Secondary | ICD-10-CM | POA: Diagnosis not present

## 2015-01-28 DIAGNOSIS — Z87442 Personal history of urinary calculi: Secondary | ICD-10-CM | POA: Diagnosis not present

## 2015-01-28 DIAGNOSIS — E785 Hyperlipidemia, unspecified: Secondary | ICD-10-CM | POA: Diagnosis not present

## 2015-01-28 DIAGNOSIS — Z79899 Other long term (current) drug therapy: Secondary | ICD-10-CM | POA: Diagnosis not present

## 2015-01-28 DIAGNOSIS — Z86010 Personal history of colonic polyps: Secondary | ICD-10-CM | POA: Diagnosis not present

## 2015-01-28 DIAGNOSIS — I10 Essential (primary) hypertension: Secondary | ICD-10-CM | POA: Diagnosis not present

## 2015-01-28 DIAGNOSIS — Z7982 Long term (current) use of aspirin: Secondary | ICD-10-CM | POA: Diagnosis not present

## 2015-01-28 DIAGNOSIS — F419 Anxiety disorder, unspecified: Secondary | ICD-10-CM | POA: Diagnosis not present

## 2015-01-28 HISTORY — DX: Anxiety disorder, unspecified: F41.9

## 2015-01-28 LAB — CBC
HCT: 44.6 % (ref 39.0–52.0)
Hemoglobin: 15.5 g/dL (ref 13.0–17.0)
MCH: 30.9 pg (ref 26.0–34.0)
MCHC: 34.8 g/dL (ref 30.0–36.0)
MCV: 88.8 fL (ref 78.0–100.0)
PLATELETS: 193 10*3/uL (ref 150–400)
RBC: 5.02 MIL/uL (ref 4.22–5.81)
RDW: 12.9 % (ref 11.5–15.5)
WBC: 4.9 10*3/uL (ref 4.0–10.5)

## 2015-01-28 LAB — BASIC METABOLIC PANEL
Anion gap: 10 (ref 5–15)
BUN: 15 mg/dL (ref 6–23)
CO2: 26 mmol/L (ref 19–32)
Calcium: 9.3 mg/dL (ref 8.4–10.5)
Chloride: 103 mmol/L (ref 96–112)
Creatinine, Ser: 0.61 mg/dL (ref 0.50–1.35)
GFR calc Af Amer: >90 mL/min (ref >90)
GFR calc non Af Amer: >90 mL/min (ref >90)
GLUCOSE: 105 mg/dL — AB (ref 70–99)
POTASSIUM: 3.8 mmol/L (ref 3.5–5.1)
Sodium: 139 mmol/L (ref 135–145)

## 2015-01-28 LAB — SURGICAL PCR SCREEN
MRSA, PCR: NEGATIVE
Staphylococcus aureus: POSITIVE — AB

## 2015-01-28 MED ORDER — MUPIROCIN 2 % EX OINT
1.0000 | TOPICAL_OINTMENT | Freq: Once | CUTANEOUS | Status: DC
Start: 2015-01-28 — End: 2015-01-29

## 2015-01-28 NOTE — Pre-Procedure Instructions (Signed)
Ronald Hunter  01/28/2015   Your procedure is scheduled on: Saturday, February 13.  Report to Emergency Department at 6:00 AM.  Call this number if you have problems the morning of surgery: [phone]    Remember:   Do not eat food or drink liquids after midnight.   Take these medicines the morning of surgery with A SIP OF WATER: metoprolol succinate (TOPROL-XL).               Stop Aspirin, Meloxicam (Mobic) and Vitamins.   Do not wear jewelry, make-up or nail polish.  Do not wear lotions, powders, or perfumes.  Men may shave face and neck.  Do not bring valuables to the hospital.              [facility] is not responsible for any belongings or valuables.               Contacts, dentures or bridgework may not be worn into surgery.  Leave suitcase in the car. After surgery it may be brought to your room.  For patients admitted to the hospital, discharge time is determined by your treatment team.               Patients discharged the day of surgery will not be allowed to drive home.  Name and phone number of your driver: -   Special Instructions: Review  [facility] - Preparing For Surgery.   Please read over the following fact sheets that you were given: Pain Booklet, Coughing and Deep Breathing and Surgical Site Infection Prevention

## 2015-01-29 ENCOUNTER — Ambulatory Visit (HOSPITAL_COMMUNITY): Payer: No Typology Code available for payment source | Admitting: Certified Registered Nurse Anesthetist

## 2015-01-29 ENCOUNTER — Encounter (HOSPITAL_COMMUNITY): Payer: Self-pay | Admitting: Certified Registered Nurse Anesthetist

## 2015-01-29 ENCOUNTER — Observation Stay (HOSPITAL_COMMUNITY)
Admission: RE | Admit: 2015-01-29 | Discharge: 2015-01-30 | Disposition: A | Discharge status: Home / Self Care | Payer: No Typology Code available for payment source | Source: Ambulatory Visit | Attending: Neurosurgery | Admitting: Neurosurgery

## 2015-01-29 ENCOUNTER — Ambulatory Visit (HOSPITAL_COMMUNITY): Payer: No Typology Code available for payment source

## 2015-01-29 ENCOUNTER — Encounter (HOSPITAL_COMMUNITY)
Admission: RE | Disposition: A | Discharge status: Home / Self Care | Payer: Self-pay | Source: Ambulatory Visit | Attending: Neurosurgery

## 2015-01-29 DIAGNOSIS — Z87442 Personal history of urinary calculi: Secondary | ICD-10-CM | POA: Insufficient documentation

## 2015-01-29 DIAGNOSIS — Z79899 Other long term (current) drug therapy: Secondary | ICD-10-CM | POA: Insufficient documentation

## 2015-01-29 DIAGNOSIS — Z7982 Long term (current) use of aspirin: Secondary | ICD-10-CM | POA: Insufficient documentation

## 2015-01-29 DIAGNOSIS — M5116 Intervertebral disc disorders with radiculopathy, lumbar region: Principal | ICD-10-CM | POA: Insufficient documentation

## 2015-01-29 DIAGNOSIS — F419 Anxiety disorder, unspecified: Secondary | ICD-10-CM | POA: Insufficient documentation

## 2015-01-29 DIAGNOSIS — Z86010 Personal history of colonic polyps: Secondary | ICD-10-CM | POA: Insufficient documentation

## 2015-01-29 DIAGNOSIS — E785 Hyperlipidemia, unspecified: Secondary | ICD-10-CM | POA: Insufficient documentation

## 2015-01-29 DIAGNOSIS — I10 Essential (primary) hypertension: Secondary | ICD-10-CM | POA: Insufficient documentation

## 2015-01-29 DIAGNOSIS — M5126 Other intervertebral disc displacement, lumbar region: Secondary | ICD-10-CM | POA: Diagnosis present

## 2015-01-29 HISTORY — PX: LUMBAR LAMINECTOMY/DECOMPRESSION MICRODISCECTOMY: SHX5026

## 2015-01-29 SURGERY — LUMBAR LAMINECTOMY/DECOMPRESSION MICRODISCECTOMY 1 LEVEL
Anesthesia: General | Site: Spine Lumbar | Laterality: Right

## 2015-01-29 MED ORDER — SUCCINYLCHOLINE CHLORIDE 20 MG/ML IJ SOLN
INTRAMUSCULAR | Status: AC
  Filled 2015-01-29: qty 1

## 2015-01-29 MED ORDER — OXYCODONE HCL 5 MG/5ML PO SOLN: 5.0000 mg | Freq: Once | ORAL | Status: DC | PRN

## 2015-01-29 MED ORDER — SODIUM CHLORIDE 0.9 % IV SOLN: 250.0000 mL | INTRAVENOUS | Status: DC

## 2015-01-29 MED ORDER — ROCURONIUM BROMIDE 50 MG/5ML IV SOLN
INTRAVENOUS | Status: AC
  Filled 2015-01-29: qty 1

## 2015-01-29 MED ORDER — ONDANSETRON HCL 4 MG/2ML IJ SOLN
4.0000 mg | Freq: Four times a day (QID) | INTRAMUSCULAR | Status: DC | PRN
  Administered 2015-01-29: 4 mg via INTRAVENOUS

## 2015-01-29 MED ORDER — PHENYLEPHRINE 40 MCG/ML (10ML) SYRINGE FOR IV PUSH (FOR BLOOD PRESSURE SUPPORT)
PREFILLED_SYRINGE | INTRAVENOUS | Status: AC
  Filled 2015-01-29: qty 10

## 2015-01-29 MED ORDER — ONDANSETRON HCL 4 MG/2ML IJ SOLN
INTRAMUSCULAR | Status: AC
  Filled 2015-01-29: qty 2

## 2015-01-29 MED ORDER — PHENYLEPHRINE HCL 10 MG/ML IJ SOLN
INTRAMUSCULAR | Status: DC | PRN
  Administered 2015-01-29: 40 ug via INTRAVENOUS
  Administered 2015-01-29: 80 ug via INTRAVENOUS
  Administered 2015-01-29 (×2): 40 ug via INTRAVENOUS
  Administered 2015-01-29 (×2): 80 ug via INTRAVENOUS
  Administered 2015-01-29: 40 ug via INTRAVENOUS

## 2015-01-29 MED ORDER — CEFAZOLIN SODIUM-DEXTROSE 2-3 GM-% IV SOLR
INTRAVENOUS | Status: AC
  Filled 2015-01-29: qty 50

## 2015-01-29 MED ORDER — LACTATED RINGERS IV SOLN
INTRAVENOUS | Status: DC | PRN
  Administered 2015-01-29 (×2): via INTRAVENOUS

## 2015-01-29 MED ORDER — ACETAMINOPHEN 10 MG/ML IV SOLN
INTRAVENOUS | Status: AC
  Administered 2015-01-29: 1000 mg via INTRAVENOUS
  Filled 2015-01-29: qty 100

## 2015-01-29 MED ORDER — FENTANYL CITRATE 0.05 MG/ML IJ SOLN
INTRAMUSCULAR | Status: AC
  Filled 2015-01-29: qty 5

## 2015-01-29 MED ORDER — PHENOL 1.4 % MT LIQD: 1.0000 | OROMUCOSAL | Status: DC | PRN

## 2015-01-29 MED ORDER — HEMOSTATIC AGENTS (NO CHARGE) OPTIME
TOPICAL | Status: DC | PRN
  Administered 2015-01-29: 1 via TOPICAL

## 2015-01-29 MED ORDER — PHENYLEPHRINE HCL 10 MG/ML IJ SOLN
10.0000 mg | INTRAVENOUS | Status: DC | PRN
  Administered 2015-01-29: 20 ug/min via INTRAVENOUS

## 2015-01-29 MED ORDER — KCL IN DEXTROSE-NACL 20-5-0.45 MEQ/L-%-% IV SOLN: INTRAVENOUS | Status: DC

## 2015-01-29 MED ORDER — METHYLPREDNISOLONE ACETATE 80 MG/ML IJ SUSP
INTRAMUSCULAR | Status: DC | PRN
  Administered 2015-01-29: 80 mg

## 2015-01-29 MED ORDER — ACETAMINOPHEN 325 MG PO TABS: 650.0000 mg | ORAL_TABLET | ORAL | Status: DC | PRN

## 2015-01-29 MED ORDER — STERILE WATER FOR INJECTION IJ SOLN
INTRAMUSCULAR | Status: AC
  Filled 2015-01-29: qty 10

## 2015-01-29 MED ORDER — MAGNESIUM HYDROXIDE 400 MG/5ML PO SUSP: 30.0000 mL | Freq: Every day | ORAL | Status: DC | PRN

## 2015-01-29 MED ORDER — LISINOPRIL 10 MG PO TABS
10.0000 mg | ORAL_TABLET | Freq: Every day | ORAL | Status: DC
  Administered 2015-01-30: 10 mg via ORAL
  Filled 2015-01-29 (×2): qty 1

## 2015-01-29 MED ORDER — HYDROXYZINE HCL 25 MG PO TABS: 50.0000 mg | ORAL_TABLET | ORAL | Status: DC | PRN

## 2015-01-29 MED ORDER — KETOROLAC TROMETHAMINE 30 MG/ML IJ SOLN
30.0000 mg | Freq: Once | INTRAMUSCULAR | Status: AC
  Administered 2015-01-29: 30 mg via INTRAVENOUS

## 2015-01-29 MED ORDER — 0.9 % SODIUM CHLORIDE (POUR BTL) OPTIME
TOPICAL | Status: DC | PRN
  Administered 2015-01-29: 1000 mL

## 2015-01-29 MED ORDER — FENTANYL CITRATE 0.05 MG/ML IJ SOLN
INTRAMUSCULAR | Status: DC | PRN
  Administered 2015-01-29: 100 ug via INTRAVENOUS

## 2015-01-29 MED ORDER — KETOROLAC TROMETHAMINE 30 MG/ML IJ SOLN
INTRAMUSCULAR | Status: AC
  Filled 2015-01-29: qty 1

## 2015-01-29 MED ORDER — THROMBIN 5000 UNITS EX SOLR
CUTANEOUS | Status: DC | PRN
  Administered 2015-01-29 (×2): 5000 [IU] via TOPICAL

## 2015-01-29 MED ORDER — KETOROLAC TROMETHAMINE 30 MG/ML IJ SOLN
30.0000 mg | Freq: Four times a day (QID) | INTRAMUSCULAR | Status: DC
  Administered 2015-01-29 – 2015-01-30 (×5): 30 mg via INTRAVENOUS
  Filled 2015-01-29 (×5): qty 1

## 2015-01-29 MED ORDER — BUPIVACAINE HCL (PF) 0.5 % IJ SOLN
INTRAMUSCULAR | Status: DC | PRN
  Administered 2015-01-29: 5 mL

## 2015-01-29 MED ORDER — MENTHOL 3 MG MT LOZG: 1.0000 | LOZENGE | OROMUCOSAL | Status: DC | PRN

## 2015-01-29 MED ORDER — ALUM & MAG HYDROXIDE-SIMETH 200-200-20 MG/5ML PO SUSP: 30.0000 mL | Freq: Four times a day (QID) | ORAL | Status: DC | PRN

## 2015-01-29 MED ORDER — NEOSTIGMINE METHYLSULFATE 10 MG/10ML IV SOLN
INTRAVENOUS | Status: DC | PRN
  Administered 2015-01-29: 4 mg via INTRAVENOUS

## 2015-01-29 MED ORDER — MIDAZOLAM HCL 5 MG/5ML IJ SOLN
INTRAMUSCULAR | Status: DC | PRN
  Administered 2015-01-29: 2 mg via INTRAVENOUS

## 2015-01-29 MED ORDER — ARTIFICIAL TEARS OP OINT
TOPICAL_OINTMENT | OPHTHALMIC | Status: DC | PRN
  Administered 2015-01-29: 1 via OPHTHALMIC

## 2015-01-29 MED ORDER — ARTIFICIAL TEARS OP OINT
TOPICAL_OINTMENT | OPHTHALMIC | Status: AC
  Filled 2015-01-29: qty 3.5

## 2015-01-29 MED ORDER — SODIUM CHLORIDE 0.9 % IJ SOLN: 3.0000 mL | Freq: Two times a day (BID) | INTRAMUSCULAR | Status: DC

## 2015-01-29 MED ORDER — HYDROXYZINE HCL 50 MG/ML IM SOLN
50.0000 mg | INTRAMUSCULAR | Status: DC | PRN
  Filled 2015-01-29: qty 1

## 2015-01-29 MED ORDER — SODIUM CHLORIDE 0.9 % IR SOLN
Status: DC | PRN
  Administered 2015-01-29: 09:00:00

## 2015-01-29 MED ORDER — OXYCODONE-ACETAMINOPHEN 5-325 MG PO TABS
1.0000 | ORAL_TABLET | ORAL | Status: DC | PRN
  Administered 2015-01-29 – 2015-01-30 (×3): 2 via ORAL
  Filled 2015-01-29 (×3): qty 2

## 2015-01-29 MED ORDER — ONDANSETRON HCL 4 MG/2ML IJ SOLN
INTRAMUSCULAR | Status: DC | PRN
  Administered 2015-01-29: 4 mg via INTRAVENOUS

## 2015-01-29 MED ORDER — ZOLPIDEM TARTRATE 5 MG PO TABS: 10.0000 mg | ORAL_TABLET | Freq: Every evening | ORAL | Status: DC | PRN

## 2015-01-29 MED ORDER — MIDAZOLAM HCL 2 MG/2ML IJ SOLN
INTRAMUSCULAR | Status: AC
  Filled 2015-01-29: qty 2

## 2015-01-29 MED ORDER — PROPOFOL 10 MG/ML IV BOLUS
INTRAVENOUS | Status: AC
  Filled 2015-01-29: qty 20

## 2015-01-29 MED ORDER — SODIUM CHLORIDE 0.9 % IJ SOLN: 3.0000 mL | INTRAMUSCULAR | Status: DC | PRN

## 2015-01-29 MED ORDER — METOPROLOL SUCCINATE ER 25 MG PO TB24
50.0000 mg | ORAL_TABLET | Freq: Every day | ORAL | Status: DC
  Administered 2015-01-29: 50 mg via ORAL
  Filled 2015-01-29: qty 2

## 2015-01-29 MED ORDER — ALBUTEROL SULFATE HFA 108 (90 BASE) MCG/ACT IN AERS
INHALATION_SPRAY | RESPIRATORY_TRACT | Status: AC
  Filled 2015-01-29: qty 6.7

## 2015-01-29 MED ORDER — MUPIROCIN 2 % EX OINT
TOPICAL_OINTMENT | CUTANEOUS | Status: AC
  Administered 2015-01-29: 1 via NASAL
  Filled 2015-01-29: qty 22

## 2015-01-29 MED ORDER — DEXAMETHASONE SODIUM PHOSPHATE 10 MG/ML IJ SOLN
INTRAMUSCULAR | Status: AC
  Filled 2015-01-29: qty 1

## 2015-01-29 MED ORDER — GLYCOPYRROLATE 0.2 MG/ML IJ SOLN
INTRAMUSCULAR | Status: AC
  Filled 2015-01-29: qty 3

## 2015-01-29 MED ORDER — FENTANYL CITRATE 0.05 MG/ML IJ SOLN
INTRAMUSCULAR | Status: DC | PRN
Start: 2015-01-29 — End: 2015-01-29
  Administered 2015-01-29 (×3): 50 ug via INTRAVENOUS

## 2015-01-29 MED ORDER — ROCURONIUM BROMIDE 100 MG/10ML IV SOLN
INTRAVENOUS | Status: DC | PRN
  Administered 2015-01-29: 40 mg via INTRAVENOUS

## 2015-01-29 MED ORDER — HYDROMORPHONE HCL 1 MG/ML IJ SOLN
INTRAMUSCULAR | Status: AC
  Filled 2015-01-29: qty 1

## 2015-01-29 MED ORDER — PROPOFOL 10 MG/ML IV BOLUS
INTRAVENOUS | Status: DC | PRN
  Administered 2015-01-29: 150 mg via INTRAVENOUS

## 2015-01-29 MED ORDER — CYCLOBENZAPRINE HCL 10 MG PO TABS: 10.0000 mg | ORAL_TABLET | Freq: Three times a day (TID) | ORAL | Status: DC | PRN

## 2015-01-29 MED ORDER — ACETAMINOPHEN 650 MG RE SUPP: 650.0000 mg | RECTAL | Status: DC | PRN

## 2015-01-29 MED ORDER — LIDOCAINE HCL (CARDIAC) 20 MG/ML IV SOLN
INTRAVENOUS | Status: AC
  Filled 2015-01-29: qty 5

## 2015-01-29 MED ORDER — HYDROMORPHONE HCL 1 MG/ML IJ SOLN: 0.2500 mg | INTRAMUSCULAR | Status: DC | PRN

## 2015-01-29 MED ORDER — ROSUVASTATIN CALCIUM 20 MG PO TABS
20.0000 mg | ORAL_TABLET | Freq: Every day | ORAL | Status: DC
  Administered 2015-01-29: 20 mg via ORAL
  Filled 2015-01-29 (×2): qty 1

## 2015-01-29 MED ORDER — BISACODYL 10 MG RE SUPP: 10.0000 mg | Freq: Every day | RECTAL | Status: DC | PRN

## 2015-01-29 MED ORDER — OXYCODONE HCL 5 MG PO TABS: 5.0000 mg | ORAL_TABLET | Freq: Once | ORAL | Status: DC | PRN

## 2015-01-29 MED ORDER — LIDOCAINE HCL (CARDIAC) 20 MG/ML IV SOLN
INTRAVENOUS | Status: DC | PRN
  Administered 2015-01-29: 100 mg via INTRAVENOUS

## 2015-01-29 MED ORDER — FENTANYL CITRATE 0.05 MG/ML IJ SOLN
INTRAMUSCULAR | Status: AC
  Filled 2015-01-29: qty 2

## 2015-01-29 MED ORDER — PROMETHAZINE HCL 25 MG/ML IJ SOLN: 6.2500 mg | INTRAMUSCULAR | Status: DC | PRN

## 2015-01-29 MED ORDER — GLYCOPYRROLATE 0.2 MG/ML IJ SOLN
INTRAMUSCULAR | Status: DC | PRN
  Administered 2015-01-29: 0.6 mg via INTRAVENOUS

## 2015-01-29 MED ORDER — MUPIROCIN 2 % EX OINT
TOPICAL_OINTMENT | Freq: Two times a day (BID) | CUTANEOUS | Status: DC
  Administered 2015-01-29: 22:00:00 via NASAL
  Administered 2015-01-29: 1 via NASAL
  Filled 2015-01-29 (×2): qty 22

## 2015-01-29 MED ORDER — VANCOMYCIN HCL IN DEXTROSE 1-5 GM/200ML-% IV SOLN
INTRAVENOUS | Status: AC
  Administered 2015-01-29: 1000 mg via INTRAVENOUS
  Filled 2015-01-29: qty 200

## 2015-01-29 MED ORDER — KETOROLAC TROMETHAMINE 30 MG/ML IJ SOLN: 30.0000 mg | Freq: Once | INTRAMUSCULAR | Status: DC | PRN

## 2015-01-29 MED ORDER — NEOSTIGMINE METHYLSULFATE 10 MG/10ML IV SOLN
INTRAVENOUS | Status: AC
  Filled 2015-01-29: qty 1

## 2015-01-29 MED ORDER — LIDOCAINE-EPINEPHRINE 1 %-1:100000 IJ SOLN
INTRAMUSCULAR | Status: DC | PRN
  Administered 2015-01-29: 5 mL

## 2015-01-29 MED ORDER — EPHEDRINE SULFATE 50 MG/ML IJ SOLN
INTRAMUSCULAR | Status: AC
  Filled 2015-01-29: qty 1

## 2015-01-29 MED ORDER — HYDROCODONE-ACETAMINOPHEN 5-325 MG PO TABS: 1.0000 | ORAL_TABLET | ORAL | Status: DC | PRN

## 2015-01-29 MED ORDER — MORPHINE SULFATE 4 MG/ML IJ SOLN
4.0000 mg | INTRAMUSCULAR | Status: DC | PRN
  Administered 2015-01-29: 4 mg via INTRAMUSCULAR
  Filled 2015-01-29: qty 1

## 2015-01-29 MED ORDER — CEFAZOLIN SODIUM-DEXTROSE 2-3 GM-% IV SOLR
INTRAVENOUS | Status: DC | PRN
  Administered 2015-01-29: 2 g via INTRAVENOUS

## 2015-01-29 SURGICAL SUPPLY — 63 items
ADH SKN CLS APL DERMABOND .7 (GAUZE/BANDAGES/DRESSINGS) ×1
APL SKNCLS STERI-STRIP NONHPOA (GAUZE/BANDAGES/DRESSINGS)
BAG DECANTER FOR FLEXI CONT (MISCELLANEOUS) ×2 IMPLANT
BENZOIN TINCTURE PRP APPL 2/3 (GAUZE/BANDAGES/DRESSINGS) IMPLANT
BLADE CLIPPER SURG (BLADE) ×1 IMPLANT
BRUSH SCRUB EZ PLAIN DRY (MISCELLANEOUS) ×2 IMPLANT
BUR ACORN 6.0 ACORN (BURR) ×1 IMPLANT
BUR ACRON 5.0MM COATED (BURR) IMPLANT
BUR MATCHSTICK NEURO 3.0 LAGG (BURR) ×2 IMPLANT
CANISTER SUCT 3000ML PPV (MISCELLANEOUS) ×2 IMPLANT
CONT SPEC 4OZ CLIKSEAL STRL BL (MISCELLANEOUS) ×1 IMPLANT
DERMABOND ADVANCED (GAUZE/BANDAGES/DRESSINGS) ×1
DERMABOND ADVANCED .7 DNX12 (GAUZE/BANDAGES/DRESSINGS) IMPLANT
DRAPE LAPAROTOMY 100X72X124 (DRAPES) ×2 IMPLANT
DRAPE MICROSCOPE LEICA (MISCELLANEOUS) ×2 IMPLANT
DRAPE POUCH INSTRU U-SHP 10X18 (DRAPES) ×2 IMPLANT
DRSG EMULSION OIL 3X3 NADH (GAUZE/BANDAGES/DRESSINGS) IMPLANT
ELECT REM PT RETURN 9FT ADLT (ELECTROSURGICAL) ×2
ELECTRODE REM PT RTRN 9FT ADLT (ELECTROSURGICAL) ×1 IMPLANT
GAUZE SPONGE 4X4 12PLY STRL (GAUZE/BANDAGES/DRESSINGS) ×1 IMPLANT
GAUZE SPONGE 4X4 16PLY XRAY LF (GAUZE/BANDAGES/DRESSINGS) IMPLANT
GLOVE BIO SURGEON STRL SZ7 (GLOVE) ×1 IMPLANT
GLOVE BIO SURGEON STRL SZ8 (GLOVE) ×1 IMPLANT
GLOVE BIOGEL PI IND STRL 8 (GLOVE) ×1 IMPLANT
GLOVE BIOGEL PI INDICATOR 8 (GLOVE) ×3
GLOVE ECLIPSE 7.5 STRL STRAW (GLOVE) ×5 IMPLANT
GLOVE EXAM NITRILE LRG STRL (GLOVE) IMPLANT
GLOVE EXAM NITRILE MD LF STRL (GLOVE) IMPLANT
GLOVE EXAM NITRILE XL STR (GLOVE) IMPLANT
GLOVE EXAM NITRILE XS STR PU (GLOVE) IMPLANT
GOWN STRL REUS W/ TWL LRG LVL3 (GOWN DISPOSABLE) ×1 IMPLANT
GOWN STRL REUS W/ TWL XL LVL3 (GOWN DISPOSABLE) IMPLANT
GOWN STRL REUS W/TWL 2XL LVL3 (GOWN DISPOSABLE) ×1 IMPLANT
GOWN STRL REUS W/TWL LRG LVL3 (GOWN DISPOSABLE) ×2
GOWN STRL REUS W/TWL XL LVL3 (GOWN DISPOSABLE) ×2
KIT BASIN OR (CUSTOM PROCEDURE TRAY) ×2 IMPLANT
KIT ROOM TURNOVER OR (KITS) ×2 IMPLANT
LIQUID BAND (GAUZE/BANDAGES/DRESSINGS) IMPLANT
NDL HYPO 18GX1.5 BLUNT FILL (NEEDLE) IMPLANT
NDL SPNL 18GX3.5 QUINCKE PK (NEEDLE) ×1 IMPLANT
NDL SPNL 22GX3.5 QUINCKE BK (NEEDLE) ×1 IMPLANT
NEEDLE HYPO 18GX1.5 BLUNT FILL (NEEDLE) IMPLANT
NEEDLE SPNL 18GX3.5 QUINCKE PK (NEEDLE) ×2 IMPLANT
NEEDLE SPNL 22GX3.5 QUINCKE BK (NEEDLE) ×2 IMPLANT
NS IRRIG 1000ML POUR BTL (IV SOLUTION) ×2 IMPLANT
PACK LAMINECTOMY NEURO (CUSTOM PROCEDURE TRAY) ×2 IMPLANT
PAD ARMBOARD 7.5X6 YLW CONV (MISCELLANEOUS) ×6 IMPLANT
PATTIES SURGICAL .5 X1 (DISPOSABLE) ×2 IMPLANT
RUBBERBAND STERILE (MISCELLANEOUS) ×4 IMPLANT
SPONGE LAP 4X18 X RAY DECT (DISPOSABLE) IMPLANT
SPONGE SURGIFOAM ABS GEL SZ50 (HEMOSTASIS) ×2 IMPLANT
STRIP CLOSURE SKIN 1/2X4 (GAUZE/BANDAGES/DRESSINGS) IMPLANT
SUT PROLENE 6 0 BV (SUTURE) ×1 IMPLANT
SUT VIC AB 1 CT1 18XBRD ANBCTR (SUTURE) ×1 IMPLANT
SUT VIC AB 1 CT1 8-18 (SUTURE) ×2
SUT VIC AB 2-0 CP2 18 (SUTURE) ×3 IMPLANT
SUT VIC AB 3-0 SH 8-18 (SUTURE) ×1 IMPLANT
SYR 20ML ECCENTRIC (SYRINGE) ×2 IMPLANT
SYR 5ML LL (SYRINGE) IMPLANT
TAPE CLOTH SURG 4X10 WHT LF (GAUZE/BANDAGES/DRESSINGS) ×1 IMPLANT
TOWEL OR 17X24 6PK STRL BLUE (TOWEL DISPOSABLE) ×2 IMPLANT
TOWEL OR 17X26 10 PK STRL BLUE (TOWEL DISPOSABLE) ×2 IMPLANT
WATER STERILE IRR 1000ML POUR (IV SOLUTION) ×2 IMPLANT

## 2015-01-29 NOTE — Progress Notes (Signed)
Pt ambulated to the bathroom with minimal assist, will medicate and attempt further distance at later time.

## 2015-01-29 NOTE — Anesthesia Procedure Notes (Signed)
Procedure Name: Intubation Date/Time: 01/29/2015 7:48 AM Performed by: Garrison Columbus T Pre-anesthesia Checklist: Patient identified, Emergency Drugs available, Suction available and Patient being monitored Patient Re-evaluated:Patient Re-evaluated prior to inductionOxygen Delivery Method: Circle system utilized Preoxygenation: Pre-oxygenation with 100% oxygen Intubation Type: IV induction Ventilation: Mask ventilation without difficulty Laryngoscope Size: Miller and 2 Grade View: Grade I Tube type: Oral Tube size: 7.5 mm Number of attempts: 1 Airway Equipment and Method: Stylet Placement Confirmation: ETT inserted through vocal cords under direct vision,  positive ETCO2 and breath sounds checked- equal and bilateral Secured at: 23 cm Tube secured with: Tape Dental Injury: Teeth and Oropharynx as per pre-operative assessment

## 2015-01-29 NOTE — H&P (Signed)
Subjective: Patient is a 64 y.o. male who is admitted for treatment of very large recurrent right L4-5 lumbar disc herniation. Patient is status post a right L4-5 lumbar laminotomy and microdiscectomy for a very large right L4-5 lumbar disc patient in August 2013. He had done well up until about a month ago when he began to develop some discomfort in the right side of low back. However a little over 2 weeks ago he developed severe radicular pain radiating down to the right buttock, posterior lateral right hip, thigh, and leg, and into the sole of the right foot. He has been unable to get relief with any medications or position. Patient does have a history of low back and left lumbar radicular symptoms dating back 2 decades.  Patient was studied with MRI which revealed mild degenerative changes at the L1-2, L2-3, and L3-4 levels, but a very large recurrent right L4-5 lumbar disc herniation, the fragment begins at the level of the L4-5 disc space, but extends rostrally to the level of the L4 pedicle. There is severe thecal sac and nerve root compression corresponding to his disabling radiculopathy and right foot drop. Patient was also found to have a enlarged left L5-S1 lumbar disc patient (previously noted on his MRI from July 2013).  Patient admitted now for a right L4-5 lumbar laminotomy and microdiscectomy for very large recurrent disc herniation.   Past Medical History  Diagnosis Date  . Hypertension     takes Metoprolol daily  . Hyperlipidemia     takes Crestor daily  . History of bronchitis     as a child  . Chronic back pain     stenosis/spondylosis  . Arthritis   . History of colon polyps   . History of kidney stones   . PONV (postoperative nausea and vomiting)     Nausea with one surgery  . Anxiety     Past Surgical History  Procedure Laterality Date  . Tonsillectomy    . Cyst removed from under left arm    . Knee arthroscopy      right   . Shoulder surgery      right   .  Colonoscopy    . Lumbar laminectomy/decompression microdiscectomy  08/11/2012    Procedure: LUMBAR LAMINECTOMY/DECOMPRESSION MICRODISCECTOMY 1 LEVEL;  Surgeon: Hosie Spangle, MD;  Location: [facility];  Service: Neurosurgery;  Laterality: Right;  RIGHT L45 laminotomy and microdiskectomy  . Colonoscopy w/ polypectomy    . Colonoscopy      several  . Knee arthroscopy w/ acl reconstruction      Prescriptions prior to admission  Medication Sig Dispense Refill Last Dose  . acetaminophen (TYLENOL) 500 MG tablet Take 1,000 mg by mouth every 6 (six) hours as needed.   01/29/2015 at Unknown time  . aspirin 81 MG tablet Take 81 mg by mouth daily.   01/25/2015  . Aspirin-Acetaminophen-Caffeine (GOODYS EXTRA STRENGTH PO) Take 1 Package by mouth.   01/26/2015  . lisinopril (PRINIVIL,ZESTRIL) 10 MG tablet Take 10 mg by mouth daily.   01/28/2015 at Unknown time  . metoprolol succinate (TOPROL-XL) 50 MG 24 hr tablet Take 50 mg by mouth at bedtime. Take with or immediately following a meal.   01/28/2015 at 2100  . Multiple Vitamin (MULTIVITAMIN WITH MINERALS) TABS Take 1 tablet by mouth daily.   01/28/2015 at Unknown time  . rosuvastatin (CRESTOR) 20 MG tablet Take 20 mg by mouth daily.   01/28/2015 at Unknown time  . BAYER ASPIRIN EXTRA  STRENGTH PO Take by mouth as needed.   Not Taking at Unknown time  . meloxicam (MOBIC) 15 MG tablet Take 1 tablet (15 mg total) by mouth daily. (Patient not taking: Reported on 01/29/2015) 30 tablet 3 Not Taking at Unknown time  . methylPREDNIsolone (MEDROL DOSPACK) 4 MG tablet follow package directions (Patient not taking: Reported on 01/29/2015) 21 tablet 0 Not Taking at Unknown time   No Known Allergies  History  Substance Use Topics  . Smoking status: Never Smoker   . Smokeless tobacco: Not on file  . Alcohol Use: 9.6 oz/week    0 Standard drinks or equivalent, 2 Shots of liquor, 14 Glasses of wine per week     Comment: nightly    History reviewed. No pertinent family  history.   Review of Systems A comprehensive review of systems was negative.  Objective: Vital signs in last 24 hours: Temp:  [97.8 F (36.6 C)-97.9 F (36.6 C)] 97.9 F (36.6 C) (02/13 0634) Pulse Rate:  [68-70] 70 (02/13 0634) Resp:  [18-20] 18 (02/13 0634) BP: (145-151)/(89-92) 145/89 mmHg (02/13 0634) SpO2:  [96 %-97 %] 97 % (02/13 0634) Weight:  [95.709 kg (211 lb)] 95.709 kg (211 lb) (02/12 1117)  EXAM: Patient well-developed well-nourished white male in no acute distress. Lungs are clear to auscultation , the patient has symmetrical respiratory excursion. Heart has a regular rate and rhythm normal S1 and S2 no murmur.   Abdomen is soft nontender nondistended bowel sounds are present. Extremity examination shows no clubbing cyanosis or edema. Patient stands and walks with a flexed forward posture. He is only able to flex, to about 40, and he is limited in extension to 0-5. Straight leg raising is negative on the left, positive on the right at about 40. Motor examination shows 5/5 strength in the left lower extremity including the iliopsoas, quadriceps, dorsiflexor, EHL, and plantar flexor; however in the right lower shoulder the iliopsoas and quadriceps are 5 as is the plantar flexor. The right dorsiflexors 4 minus, the right EHL is 3-4 minus. Sensation is decreased to pinprick in the right foot, more so medially than laterally, with intact sensation to pinprick in the left foot. Reflexes show the left quadriceps is 2, the right quadriceps is 1. Gastrocnemius is minimal bilaterally. Toes are downgoing bilaterally. Gait and stance show a flexed forward posture, and he favors the right lower extremity as he walks.  Data Review:CBC    Component Value Date/Time   WBC 4.9 01/28/2015 1150   RBC 5.02 01/28/2015 1150   HGB 15.5 01/28/2015 1150   HCT 44.6 01/28/2015 1150   PLT 193 01/28/2015 1150   MCV 88.8 01/28/2015 1150   MCH 30.9 01/28/2015 1150   MCHC 34.8 01/28/2015 1150   RDW  12.9 01/28/2015 1150                          BMET    Component Value Date/Time   NA 139 01/28/2015 1150   K 3.8 01/28/2015 1150   CL 103 01/28/2015 1150   CO2 26 01/28/2015 1150   GLUCOSE 105* 01/28/2015 1150   BUN 15 01/28/2015 1150   CREATININE 0.61 01/28/2015 1150   CALCIUM 9.3 01/28/2015 1150   GFRNONAA >90 01/28/2015 1150   GFRAA >90 01/28/2015 1150     Assessment/Plan: Patient admitted for a right L4-5 lumbar laminotomy and micro-discectomy for a very large recurrent right L4-5 lumbar disc herniation.  I've discussed with the patient  the nature of his condition, the nature the surgical procedure, the typical length of surgery, hospital stay, and overall recuperation. We discussed limitations postoperatively. I discussed risks of surgery including risks of infection, bleeding, possibly need for transfusion, the risk of nerve root dysfunction with pain, weakness, numbness, or paresthesias, or risk of dural tear and CSF leakage and possible need for further surgery, the risk of recurrent disc herniation and the possible need for further surgery, and the risk of anesthetic complications including myocardial infarction, stroke, pneumonia, and death. Understanding all this the patient does wish to proceed with surgery and is admitted for such.   Hosie Spangle, MD 01/29/2015 7:11 AM

## 2015-01-29 NOTE — Progress Notes (Signed)
Pt very sleepy post op. Moving arms spontaneously. Opens eyes to name. Otherwise not interacting at this time. No Movement of legs. Says"ouch" to painful stimuli to lower extremities but does not move. Dr. Sherwood Gambler made aware and came to bedside. 1015 --pt responding to Dr. Sherwood Gambler and moving lt leg and following commands with rt.

## 2015-01-29 NOTE — Progress Notes (Addendum)
Pt ambulated in the hallway appx 200 ft with RN, using brace and walker.  Pt tolerated ambulation well, steady gait.  Pt responded very well to morphine, reports no pain in his RLE. Pt has voided x2 since surgery.

## 2015-01-29 NOTE — Anesthesia Postprocedure Evaluation (Signed)
  Anesthesia Post-op Note  Patient: Ronald Hunter  Procedure(s) Performed: Procedure(s) with comments: LUMBAR LAMINECTOMY/DECOMPRESSION MICRODISCECTOMY 1 LEVEL (Right) - Right L45 Laminotomy and microdisketomy  Patient Location: PACU  Anesthesia Type:General  Level of Consciousness: awake and alert   Airway and Oxygen Therapy: Patient Spontanous Breathing  Post-op Pain: none  Post-op Assessment: Post-op Vital signs reviewed  Post-op Vital Signs: Reviewed  Last Vitals:  Filed Vitals:   01/29/15 1200  BP: 166/81  Pulse: 57  Temp: 36.4 C  Resp: 20    Complications: No apparent anesthesia complications

## 2015-01-29 NOTE — Progress Notes (Addendum)
Pt arrived to 4N31 at current time.  Pt A&O x 4, c/o 5/10 lower back surgical pain, site covered with CDI gauze dressing, no drains.   Pt V/S taken, fluids running at 100 cc/hr.  Pt still DVT. Pt without distress.  Family at the bedside. Diet ordered, will monitor.

## 2015-01-29 NOTE — Transfer of Care (Signed)
Immediate Anesthesia Transfer of Care Note  Patient: Ronald Hunter  Procedure(s) Performed: Procedure(s) with comments: LUMBAR LAMINECTOMY/DECOMPRESSION MICRODISCECTOMY 1 LEVEL (Right) - Right L45 Laminotomy and microdisketomy  Patient Location: PACU  Anesthesia Type:General  Level of Consciousness: awake and alert   Airway & Oxygen Therapy: Patient Spontanous Breathing and Patient connected to nasal cannula oxygen  Post-op Assessment: Report given to RN, Post -op Vital signs reviewed and stable and Patient moving all extremities X 4  Post vital signs: Reviewed and stable  Last Vitals:  Filed Vitals:   01/29/15 0634  BP: 145/89  Pulse: 70  Temp: 36.6 C  Resp: 18    Complications: No apparent anesthesia complications

## 2015-01-29 NOTE — Progress Notes (Signed)
Pt continues with drowsiness. Pt states he has not slept in 3 days due to pain.

## 2015-01-29 NOTE — Progress Notes (Signed)
Filed Vitals:   01/29/15 0634 01/29/15 0951  BP: 145/89 147/81  Pulse: 70 72  Temp: 97.9 F (36.6 C) 97.5 F (36.4 C)  TempSrc: Oral   Resp: 18 20  SpO2: 97%     CBC  Recent Labs  01/28/15 1150  WBC 4.9  HGB 15.5  HCT 44.6  PLT 193   BMET  Recent Labs  01/28/15 1150  NA 139  K 3.8  CL 103  CO2 26  GLUCOSE 105*  BUN 15  CREATININE 0.61  CALCIUM 9.3    Patient in recovery room, remains drowsy from the anesthetic, but following commands, moving all 4 extremities. Dressing clean and dry. No void yet.  Plan: To continue PACU care, to progress to transfer to the 4 N. unit, then continue to progress through postoperative recovery.  Hosie Spangle, MD 01/29/2015, 10:19 AM

## 2015-01-29 NOTE — Anesthesia Preprocedure Evaluation (Addendum)
Anesthesia Evaluation  Patient identified by MRN, date of birth, ID band Patient awake    Reviewed: Allergy & Precautions, NPO status , Patient's Chart, lab work & pertinent test results, reviewed documented beta blocker date and time   History of Anesthesia Complications (+) PONV  Airway Mallampati: III  TM Distance: >3 FB Neck ROM: Full    Dental  (+) Teeth Intact, Dental Advisory Given   Pulmonary neg pulmonary ROS,  breath sounds clear to auscultation- rhonchi        Cardiovascular hypertension, Pt. on medications and Pt. on home beta blockers Rhythm:Regular Rate:Normal     Neuro/Psych negative neurological ROS     GI/Hepatic negative GI ROS, Neg liver ROS,   Endo/Other  negative endocrine ROS  Renal/GU negative Renal ROS     Musculoskeletal  (+) Arthritis -,   Abdominal   Peds  Hematology negative hematology ROS (+)   Anesthesia Other Findings   Reproductive/Obstetrics                            Anesthesia Physical Anesthesia Plan  ASA: II  Anesthesia Plan: General   Post-op Pain Management:    Induction: Intravenous  Airway Management Planned: Oral ETT  Additional Equipment:   Intra-op Plan:   Post-operative Plan: Extubation in OR  Informed Consent: I have reviewed the patients History and Physical, chart, labs and discussed the procedure including the risks, benefits and alternatives for the proposed anesthesia with the patient or authorized representative who has indicated his/her understanding and acceptance.   Dental advisory given  Plan Discussed with: CRNA  Anesthesia Plan Comments:         Anesthesia Quick Evaluation

## 2015-01-29 NOTE — Op Note (Signed)
01/29/2015  9:32 AM  PATIENT:  Ronald Hunter  64 y.o. male  PRE-OPERATIVE DIAGNOSIS:  Recurrent right L4-5 lumbar disc herniation, lumbar degenerative disc disease, lumbar spondylosis, lumbar radiculopathy   POST-OPERATIVE DIAGNOSIS:  Recurrent right L4-5 lumbar disc herniation, lumbar degenerative disc disease, lumbar spondylosis, lumbar radiculopathy  PROCEDURE:  Procedure(s):  Right L4-5 lumbar laminotomy and microdiscectomy, with micro-section, microsurgical technique, and the operating microscope  SURGEON:  Surgeon(s): Hosie Spangle, MD Eustace Moore, MD  ASSISTANTS: Eustace Moore, M.D.  ANESTHESIA:   general  EBL:  Total I/O In: 1500 [I.V.:1500] Out: 50 [Blood:50]  BLOOD ADMINISTERED:none  COUNT: Correct per nursing staff  DICTATION: Patient was brought to the operating room and placed under general endotracheal anesthesia. Patient was turned to prone position the lumbar region was prepped with Betadine soap and solution and draped in a sterile fashion. The midline was infiltrated with local anesthetic with epinephrine.  Midline incision was made through the previous midline incision, and over the L4-5 level and was carried down through the subcutaneous tissue to the lumbar fascia. The lumbar fascia was incised on the right side and the paraspinal muscles were dissected from the spinous processes and lamina in a subperiosteal fashion. The previous right L4-5 laminotomy was identified. An x-ray was taken and the L4-5 intralaminar space was identified. The operating microscope was draped and brought into the field provided additional magnification, illumination, and visualization. We carefully dissected around the margins of the existing laminotomy, and freed of scar tissue. Then the laminotomy was extended rostrally, laterally, and caudally, using the high-speed drill and Kerrison punches. The underlying thecal sac and  L4 and L5 nerve roots were identified. The disc herniation was  identified and we began to mobilize it using a coronary dilator. It is removed in a piecemeal fashion. Several large fragments were removed, and then additional smaller fragments were removed. The epidural space was thoroughly examined, and no further fragments were found, and it was felt that we achieved good decompression of the thecal sac and exiting L4 and L5 nerve roots.  Once the discectomy was completed and good decompression of the thecal sac and nerve had been achieved hemostasis was established with the use of bipolar cautery and Gelfoam with thrombin. The Gelfoam was removed and hemostasis confirmed. We then instilled 2 cc of fentanyl and 80 mg of Depo-Medrol into the epidural space. Deep fascia was closed with interrupted undyed 1 Vicryl sutures. Scarpa's fascia was closed with interrupted undyed 1 Vicryl sutures in the subcutaneous and subcuticular layer were closed with interrupted inverted 2-0 undyed Vicryl sutures. The skin edges were approximated with Dermabond.  A dressing of sterile gauze and Hypafix was applied. Following surgery the patient was turned back to a supine position to be reversed from the anesthetic extubated and transferred to the recovery room for further care.  PLAN OF CARE: Admit for overnight observation  PATIENT DISPOSITION:  PACU - hemodynamically stable.   Delay start of Pharmacological VTE agent (>24hrs) due to surgical blood loss or risk of bleeding:  yes

## 2015-01-29 NOTE — Progress Notes (Signed)
Filed Vitals:   01/29/15 1100 01/29/15 1115 01/29/15 1200 01/29/15 1347  BP: 134/80 149/74 166/81 150/77  Pulse: 59 51 57 73  Temp:  97.5 F (36.4 C) 97.5 F (36.4 C) 97.7 F (36.5 C)  TempSrc:   Oral Oral  Resp: 17 19 20 20   SpO2: 100% 100% 100% 99%    Patient sitting up eating dinner. Has ambulated in the halls. Has voided. Dressing clean and dry per RN.  Plan: Progressing well through initial postoperative recovery. Encouraged to ambulate.  Hosie Spangle, MD 01/29/2015, 5:43 PM

## 2015-01-30 DIAGNOSIS — M5116 Intervertebral disc disorders with radiculopathy, lumbar region: Secondary | ICD-10-CM | POA: Diagnosis not present

## 2015-01-30 MED ORDER — OXYCODONE-ACETAMINOPHEN 5-325 MG PO TABS: 1.0000 | ORAL_TABLET | ORAL | Status: DC | PRN

## 2015-01-30 NOTE — Progress Notes (Signed)
Patient [place] via car with wife.  DC instructions and prescriptions given to patient and fully understood.  Vital signs and assessments were stable.

## 2015-01-30 NOTE — Progress Notes (Signed)
Utilization Review Completed.   Jaselynn Tamas, RN, BSN Nurse Case Manager  

## 2015-01-30 NOTE — Discharge Summary (Signed)
Physician Discharge Summary  Patient ID: Ronald Hunter MRN: [id number] DOB/AGE: 1951/05/04 64 y.o.  Admit date: 01/29/2015 Discharge date: 01/30/2015  Admission Diagnoses:  Recurrent right L4-5 lumbar disc herniation, lumbar degenerative disc disease, lumbar spondylosis, lumbar radiculopathy  Discharge Diagnoses:  Recurrent right L4-5 lumbar disc herniation, lumbar degenerative disc disease, lumbar spondylosis, lumbar radiculopathy Active Problems:   HNP (herniated nucleus pulposus), lumbar   Discharged Condition: good  Hospital Course: Patient was admitted, underwent a right L4-5 lumbar laminotomy and microdiscectomy for a very large recurrent right L4-5 lumbar disc herniation, with right foot drop. Postoperatively he has done well with good relief of his disabling radicular pain, however he still has weakness in the right foot, the dorsiflexor and EHL are 4/5. He is up and ambulating in the halls. His incision is healing nicely. There is no erythema, swelling, or drainage. He is voiding well. We are discharging him to home with instructions regarding wound care and activities. He is to return for follow-up with me in 3 weeks.  Discharge Exam: Blood pressure 131/55, pulse 65, temperature 97.8 F (36.6 C), temperature source Oral, resp. rate 20, SpO2 98 %.  Disposition: Home     Medication List    STOP taking these medications        GOODYS EXTRA STRENGTH PO     meloxicam 15 MG tablet  Commonly known as:  MOBIC     methylPREDNIsolone 4 MG tablet  Commonly known as:  MEDROL DOSPACK      TAKE these medications        acetaminophen 500 MG tablet  Commonly known as:  TYLENOL  Take 1,000 mg by mouth every 6 (six) hours as needed.     aspirin 81 MG tablet  Take 81 mg by mouth daily.     BAYER ASPIRIN EXTRA STRENGTH PO  Take by mouth as needed.     lisinopril 10 MG tablet  Commonly known as:  PRINIVIL,ZESTRIL  Take 10 mg by mouth daily.     metoprolol succinate 50 MG 24 hr  tablet  Commonly known as:  TOPROL-XL  Take 50 mg by mouth at bedtime. Take with or immediately following a meal.     multivitamin with minerals Tabs tablet  Take 1 tablet by mouth daily.     oxyCODONE-acetaminophen 5-325 MG per tablet  Commonly known as:  PERCOCET/ROXICET  Take 1-2 tablets by mouth every 4 (four) hours as needed for moderate pain.     rosuvastatin 20 MG tablet  Commonly known as:  CRESTOR  Take 20 mg by mouth daily.         Signed: Hosie Spangle, MD 01/30/2015, 9:06 AM

## 2015-01-31 ENCOUNTER — Encounter (HOSPITAL_COMMUNITY): Payer: Self-pay | Admitting: Neurosurgery

## 2015-04-08 NOTE — Op Note (Signed)
PATIENT NAME:  Ronald Hunter, Ronald Hunter MR#:  [id number] DATE OF BIRTH:  [birth year]  DATE OF PROCEDURE:  03/20/2013  PREOPERATIVE DIAGNOSIS: Left knee medial meniscus tear.  POSTOPERATIVE DIAGNOSIS: Left knee medial meniscus tear with plica band.   PROCEDURE: Left knee arthroscopy, partial medial meniscectomy and plica excision.   ANESTHESIA: General   SURGEON: Laurene Footman, MD   DESCRIPTION OF PROCEDURE: The patient was brought to the operating room, and after adequate anesthesia was obtained, the patient's leg was prepped and draped in the usual sterile fashion with arthroscopic legholder and tourniquet applied but not required. After appropriate patient identification and timeout procedures were completed, an inferolateral portal was made, and the arthroscope was introduced. Initial inspection revealed mild patellofemoral chondromalacia with normal tracking, no loose bodies within the suprapatellar pouch. The gutters were checked, and there were no loose bodies there as well. Coming around to the medial compartment, an inferomedial portal was made, and on probing, initially again, femoral and tibial condyles showed some grade 1 chondromalacia changes. No fissuring or significant cartilage flaps. On probing, there is a complex tear of the posterior and middle thirds of the meniscus, with it apparently being fairly old, with the most posterolateral aspect of the meniscus having a rounded stump, consistent with this having been of long duration. There was also a horizontal component into most of the posterior third and a large flap tear extending to the middle third. A meniscal punch was initially used to get the initial debridement, followed by a shaver, and following this, ArthroCare wand to smooth the edges, and on further probing, the meniscus had been adequately addressed. The ACL was intact, and the lateral compartment was normal except for some grade 1 changes with fibrillation of the articular cartilage.  Again, no fissuring or flaps. The lateral meniscus was intact to probing. With the knee in extension, the plica band was addressed with the ArthroCare wand. The shaver was then introduced superiorly and the knee thoroughly irrigated to get any remaining meniscal fragments. After thorough irrigation, all instrumentation was withdrawn. Single simple interrupted 4-0 nylon skin sutures were placed, followed by 10 mL of 0.5% Sensorcaine with epinephrine into each portal for postoperative analgesia. Xeroform, 4 x 4's, Webril and Ace wrap applied, and the patient was sent to the recovery room in stable condition.   ESTIMATED BLOOD LOSS: Minimal.   COMPLICATIONS: None.   SPECIMEN: None.   Pre- and postprocedure pictures obtained.  ____________________________ Laurene Footman, MD mjm:OSi D: 03/20/2013 23:39:16 ET T: 03/21/2013 07:18:12 ET JOB#: [id number]  cc: Laurene Footman, MD, <Dictator> Laurene Footman MD ELECTRONICALLY SIGNED 03/21/2013 13:02

## 2017-05-15 ENCOUNTER — Telehealth: Payer: Self-pay | Admitting: General Practice

## 2017-05-15 NOTE — Telephone Encounter (Signed)
Pt will be est care with Dr. Caryl Bis on 07/29/17. Pt dropped off his medical records for the past 69yrs.. Will be delivered with the mail.

## 2017-07-29 ENCOUNTER — Encounter: Payer: Self-pay | Admitting: Family Medicine

## 2017-07-29 ENCOUNTER — Ambulatory Visit (INDEPENDENT_AMBULATORY_CARE_PROVIDER_SITE_OTHER): Payer: No Typology Code available for payment source | Admitting: Family Medicine

## 2017-07-29 DIAGNOSIS — E785 Hyperlipidemia, unspecified: Secondary | ICD-10-CM | POA: Diagnosis not present

## 2017-07-29 DIAGNOSIS — I10 Essential (primary) hypertension: Secondary | ICD-10-CM | POA: Insufficient documentation

## 2017-07-29 DIAGNOSIS — M545 Low back pain, unspecified: Secondary | ICD-10-CM | POA: Insufficient documentation

## 2017-07-29 DIAGNOSIS — G8929 Other chronic pain: Secondary | ICD-10-CM | POA: Insufficient documentation

## 2017-07-29 MED ORDER — LISINOPRIL-HYDROCHLOROTHIAZIDE 20-12.5 MG PO TABS: 1.0000 | ORAL_TABLET | Freq: Every day | ORAL | 3 refills | Status: DC

## 2017-07-29 MED ORDER — ROSUVASTATIN CALCIUM 40 MG PO TABS: 40.0000 mg | ORAL_TABLET | Freq: Every day | ORAL | 3 refills | Status: DC

## 2017-07-29 NOTE — Assessment & Plan Note (Signed)
Continue Crestor 

## 2017-07-29 NOTE — Patient Instructions (Signed)
Nice to meet you. Please continue to monitor your blood pressure. We'll refill your medications. We'll have you return in 2 weeks for recheck with the nurse and 3 months with me. Please monitor your back and if you develop numbness, weakness, loss of bowel or bladder function, or numbness between your legs please seek medical attention immediately.

## 2017-07-29 NOTE — Assessment & Plan Note (Signed)
Elevated today though reports at goal at home. He'll continue his current medications. Refills sent in. Lab work from May reviewed. We will have him return in 2 weeks for nurse blood pressure check. He'll bring in readings at that time.

## 2017-07-29 NOTE — Progress Notes (Signed)
[name], MD Phone: [phone]  Ronald Hunter is a 66 y.o. male who presents today for new patient visit.  Hypertension: Notes his blood pressures typically better than it is today. Typically around 130/80 Taking lisinopril and HCTZ. No chest pain, terms breath, or edema.  Taking Crestor for hyperlipidemia. No muscle aches or abdominal pain. He's been stable on that for some time.  He reports a history of multiple back surgeries in the past for disc herniations. He follows with neurosurgery and they offered to do a fusion last year. Notes has chronic back pain. Occasionally he'll get nerve pain in his right foot. He doesn't take any medications for any of this. He follows up with neurosurgery in the next month or 2.  Active Ambulatory Problems    Diagnosis Date Noted  . HNP (herniated nucleus pulposus), lumbar 01/29/2015  . Hypertension 07/29/2017  . Hyperlipidemia 07/29/2017  . Chronic low back pain 07/29/2017   Resolved Ambulatory Problems    Diagnosis Date Noted  . No Resolved Ambulatory Problems   Past Medical History:  Diagnosis Date  . Anxiety   . Arthritis   . Chronic back pain   . History of bronchitis   . History of colon polyps   . History of kidney stones   . Hyperlipidemia   . Hypertension   . PONV (postoperative nausea and vomiting)     Family History  Problem Relation Age of Onset  . Hyperlipidemia Mother   . Heart disease Mother   . Congestive Heart Failure Mother   . Colon cancer Father     Social History   Social History  . Marital status: Married    Spouse name: N/A  . Number of children: N/A  . Years of education: N/A   Occupational History  . Not on file.   Social History Main Topics  . Smoking status: Never Smoker  . Smokeless tobacco: Never Used  . Alcohol use 9.6 oz/week    14 Glasses of wine, 2 Shots of liquor per week  . Drug use: No  . Sexual activity: Yes   Other Topics Concern  . Not on file   Social History  Narrative  . No narrative on file    ROS  General:  Negative for nexplained weight loss, fever Skin: Negative for new or changing mole, sore that won't heal HEENT: Negative for trouble hearing, trouble seeing, ringing in ears, mouth sores, hoarseness, change in voice, dysphagia. CV:  Negative for chest pain, dyspnea, edema, palpitations Resp: Negative for cough, dyspnea, hemoptysis GI: Negative for nausea, vomiting, diarrhea, constipation, abdominal pain, melena, hematochezia. GU: Negative for dysuria, incontinence, urinary hesitance, hematuria, vaginal or penile discharge, polyuria, sexual difficulty, lumps in testicle or breasts MSK: Negative for muscle cramps or aches, joint pain or swelling Neuro: Negative for headaches, weakness, numbness, dizziness, passing out/fainting Psych: Negative for depression, anxiety, memory problems  Objective  Physical Exam Vitals:   07/29/17 0911 07/29/17 0933  BP: (!) 166/94 (!) 152/100  Pulse: 75   Temp: 98.4 F (36.9 C)   SpO2: 98%     BP Readings from Last 3 Encounters:  07/29/17 (!) 152/100  01/30/15 (!) 154/79  01/28/15 (!) 151/92   Wt Readings from Last 3 Encounters:  07/29/17 229 lb 9.6 oz (104.1 kg)  01/28/15 211 lb (95.7 kg)  11/03/14 205 lb (93 kg)    Physical Exam  Constitutional: No distress.  HENT:  Head: Normocephalic and atraumatic.  Mouth/Throat: Oropharynx is clear and moist.  No oropharyngeal exudate.  Eyes: Pupils are equal, round, and reactive to light. Conjunctivae are normal.  Cardiovascular: Normal rate, regular rhythm and normal heart sounds.   Pulmonary/Chest: Effort normal and breath sounds normal.  Abdominal: Soft. Bowel sounds are normal. He exhibits no distension. There is no tenderness. There is no rebound and no guarding.  Musculoskeletal: He exhibits no edema.  No midline spine tenderness, no midline spine step-off, no muscular back tenderness  Neurological: He is alert. Gait normal.  5/5 strength  bilateral quads, hamstrings, plantar flexion, and dorsiflexion, sensation to light touch intact bilaterally lower extremities  Skin: Skin is warm and dry. He is not diaphoretic.  Psychiatric: Mood and affect normal.     Assessment/Plan:   Hypertension Elevated today though reports at goal at home. He'll continue his current medications. Refills sent in. Lab work from May reviewed. We will have him return in 2 weeks for nurse blood pressure check. He'll bring in readings at that time.  Hyperlipidemia Continue Crestor.  Chronic low back pain Related to DDD. Follows with neurosurgery. He'll continue to monitor. Return precautions in AVS.   No orders of the defined types were placed in this encounter.   Meds ordered this encounter  Medications  . DISCONTD: lisinopril-hydrochlorothiazide (PRINZIDE,ZESTORETIC) 20-12.5 MG tablet    Sig: Take by mouth daily.  Marland Kitchen DISCONTD: rosuvastatin (CRESTOR) 40 MG tablet    Sig: Take 40 mg by mouth daily.  Marland Kitchen lisinopril-hydrochlorothiazide (PRINZIDE,ZESTORETIC) 20-12.5 MG tablet    Sig: Take 1 tablet by mouth daily.    Dispense:  90 tablet    Refill:  3  . rosuvastatin (CRESTOR) 40 MG tablet    Sig: Take 1 tablet (40 mg total) by mouth daily.    Dispense:  90 tablet    Refill:  [place], [place]

## 2017-07-29 NOTE — Assessment & Plan Note (Addendum)
Related to DDD. Follows with neurosurgery. He'll continue to monitor. Return precautions in AVS.

## 2017-08-13 ENCOUNTER — Ambulatory Visit (INDEPENDENT_AMBULATORY_CARE_PROVIDER_SITE_OTHER): Payer: No Typology Code available for payment source

## 2017-08-13 VITALS — BP 138/82 | HR 68

## 2017-08-13 DIAGNOSIS — I10 Essential (primary) hypertension: Secondary | ICD-10-CM | POA: Diagnosis not present

## 2017-08-13 NOTE — Progress Notes (Signed)
BP above goal. I would advise increasing his lisinopril or HCTZ. I can send in a new prescription if he is willing. He will need repeat lab work in a month if we increase his dose. Thanks.

## 2017-08-13 NOTE — Progress Notes (Signed)
Patient comes in for 2 week blood pressure check .  He has been monitoring blood pressure at home .  His readings have been averaging 130s-140s-78-88.    Patient denies any headache, chest pain.  He has been complaint with taking medication.

## 2017-08-14 MED ORDER — LISINOPRIL-HYDROCHLOROTHIAZIDE 20-25 MG PO TABS: 1.0000 | ORAL_TABLET | Freq: Every day | ORAL | 3 refills | Status: DC

## 2017-08-14 NOTE — Progress Notes (Signed)
Patient advised of below he is willing to increase blood pressure medication. Will schedule lab work.

## 2017-08-14 NOTE — Addendum Note (Signed)
Addended by: Leone Haven on: 08/14/2017 07:06 PM   Modules accepted: Orders

## 2017-08-14 NOTE — Progress Notes (Signed)
New prescription sent to pharmacy. He'll need repeat blood pressure check and lab work in one month. Order placed.

## 2017-08-15 ENCOUNTER — Telehealth: Payer: Self-pay | Admitting: *Deleted

## 2017-08-15 NOTE — Progress Notes (Signed)
Left voice mail to call back 

## 2017-08-15 NOTE — Telephone Encounter (Signed)
Please advise 

## 2017-08-15 NOTE — Telephone Encounter (Signed)
I don't see any results in his chart. Please find know what results he is talking about.

## 2017-08-15 NOTE — Progress Notes (Signed)
Patient advised of below appointment scheduled for lab and BP check with nurse.

## 2017-08-15 NOTE — Telephone Encounter (Addendum)
Pt requested  results  Pt contact [phone]

## 2017-08-16 NOTE — Telephone Encounter (Signed)
Patient states he did not ask for results

## 2017-08-29 ENCOUNTER — Ambulatory Visit (INDEPENDENT_AMBULATORY_CARE_PROVIDER_SITE_OTHER): Payer: No Typology Code available for payment source

## 2017-08-29 ENCOUNTER — Other Ambulatory Visit: Payer: Self-pay | Admitting: Podiatry

## 2017-08-29 ENCOUNTER — Ambulatory Visit (INDEPENDENT_AMBULATORY_CARE_PROVIDER_SITE_OTHER): Payer: No Typology Code available for payment source | Admitting: Podiatry

## 2017-08-29 ENCOUNTER — Encounter: Payer: Self-pay | Admitting: Podiatry

## 2017-08-29 DIAGNOSIS — M722 Plantar fascial fibromatosis: Secondary | ICD-10-CM | POA: Diagnosis not present

## 2017-08-29 DIAGNOSIS — R52 Pain, unspecified: Secondary | ICD-10-CM

## 2017-08-29 NOTE — Telephone Encounter (Signed)
Patient was advised of results of blood pressure check results

## 2017-08-29 NOTE — Progress Notes (Signed)
This patient presents the office with chief complaint of pain noted in his right heel.  Patient states that his pain is present upon rising in the morning and standing from a sitting position.  He states that the pain has been present for approximately 6 weeks and is increasing in severity.  He says he was working on the lawn this weekend and it became extremely painful and sore.  He says he use ice and applied it to his right heel to help reduce his pain.  He also says he is a patient of Dr. Stephenie Acres and Dr. Milinda Pointer dispensed a night brace, which she has been wearing intermittently at home.  He also relates having back surgery in 2013 and 2016.  He says this is led to a very mild dropfoot on his symptomatic right foot.  He admits to taking 500 mg of aspirin for the pain. He presents the office today for an evaluation and treatment of this painful right heel. Patient admits he has been male carrier for years.   GENERAL APPEARANCE: Alert, conversant. Appropriately groomed. No acute distress.  VASCULAR: Pedal pulses are  palpable at  [facility] and PT bilateral.  Capillary refill time is immediate to all digits,  Normal temperature gradient.  Digital hair growth is present bilateral  NEUROLOGIC: sensation is normal to 5.07 monofilament at 5/5 sites bilateral.  Light touch is intact bilateral, Muscle strength normal.  MUSCULOSKELETAL: acceptable muscle strength, tone and stability bilateral.  Intrinsic muscluature intact bilateral.  Rectus appearance of foot and digits noted bilateral. Palpable pain noted at the insertion of the plantar fascia of the right heel.  No evidence of any swelling noted.  DERMATOLOGIC: skin color, texture, and turgor are within normal limits.  No preulcerative lesions or ulcers  are seen, no interdigital maceration noted.  No open lesions present.  Digital nails are asymptomatic. No drainage noted.   Plantar fascitis right heel.  ROV  injection therapy right heel.  Plantar fascial brace was  dispensed.  Patient to return the office in 3 weeks for further evaluation and treatment.   Gardiner Barefoot DPM

## 2017-09-11 ENCOUNTER — Ambulatory Visit (INDEPENDENT_AMBULATORY_CARE_PROVIDER_SITE_OTHER): Payer: No Typology Code available for payment source

## 2017-09-11 ENCOUNTER — Other Ambulatory Visit (INDEPENDENT_AMBULATORY_CARE_PROVIDER_SITE_OTHER): Payer: No Typology Code available for payment source

## 2017-09-11 VITALS — BP 134/80 | HR 81

## 2017-09-11 DIAGNOSIS — I10 Essential (primary) hypertension: Secondary | ICD-10-CM

## 2017-09-11 NOTE — Progress Notes (Signed)
Patient comes in for blood pressure check.  He is currently on Lisinopril/HCTZ 20/12.5 mg daily.  He reports no swelling or side effects from medications. Patient has not been monitoring blood pressure at home. Blood pressure today 134/82 left arm 130/80 right arm pulse 81 02 97%.

## 2017-09-12 LAB — BASIC METABOLIC PANEL
BUN: 16 mg/dL (ref 7–25)
CALCIUM: 9.6 mg/dL (ref 8.6–10.3)
CO2: 25 mmol/L (ref 20–32)
Chloride: 103 mmol/L (ref 98–110)
Creat: 0.71 mg/dL (ref 0.70–1.25)
Glucose, Bld: 102 mg/dL — ABNORMAL HIGH (ref 65–99)
POTASSIUM: 4.1 mmol/L (ref 3.5–5.3)
Sodium: 141 mmol/L (ref 135–146)

## 2017-09-12 NOTE — Progress Notes (Signed)
BP adequately controlled. Continue current medications.

## 2017-09-12 NOTE — Progress Notes (Signed)
Patient advised of below and verbalized understanding.  

## 2017-10-23 ENCOUNTER — Encounter: Payer: Self-pay | Admitting: Family Medicine

## 2017-10-23 ENCOUNTER — Encounter (INDEPENDENT_AMBULATORY_CARE_PROVIDER_SITE_OTHER): Payer: Self-pay

## 2017-10-23 ENCOUNTER — Ambulatory Visit: Payer: No Typology Code available for payment source | Admitting: Family Medicine

## 2017-10-23 DIAGNOSIS — E785 Hyperlipidemia, unspecified: Secondary | ICD-10-CM

## 2017-10-23 DIAGNOSIS — Z23 Encounter for immunization: Secondary | ICD-10-CM | POA: Diagnosis not present

## 2017-10-23 DIAGNOSIS — Z800 Family history of malignant neoplasm of digestive organs: Secondary | ICD-10-CM | POA: Diagnosis not present

## 2017-10-23 DIAGNOSIS — I10 Essential (primary) hypertension: Secondary | ICD-10-CM

## 2017-10-23 NOTE — Patient Instructions (Signed)
Nice to see you. Please start checking her blood pressure at least 3-4 times a week.  Do this when you are resting.  Please contact us in 1-2 weeks with your readings.  If it is consistently greater than 130/80 we will likely alter your medication regimen.

## 2017-10-23 NOTE — Assessment & Plan Note (Signed)
Continue Crestor 

## 2017-10-23 NOTE — Assessment & Plan Note (Signed)
Above goal today though previously well controlled at home.  He will continue his current regimen and start checking at home.  If consistently greater than 130/80 at home we will add an additional medication such as amlodipine or increase his current medications.  He will contact us in 2 weeks with his readings.

## 2017-10-23 NOTE — Assessment & Plan Note (Signed)
Father with colon cancer.  Patient is up-to-date on colonoscopy.  Discussed benefits of primary prevention with aspirin related to colorectal cancer.

## 2017-10-23 NOTE — Progress Notes (Signed)
  [name], MD Phone: [phone]  Ronald Hunter is a 66 y.o. male who presents today for follow-up.  Hypertension: Not checking at home.  Though does report it has been in the 130/75 range in the past when he has checked it.  Taking lisinopril, HCTZ.  No chest pain, shortness of breath, or edema.  He does try to eat healthfully.  Hyperlipidemia: Taking Crestor.  No right upper quadrant pain or myalgias.   Patient does report a family history of colon cancer in his father.  He has been getting colonoscopies every 5 years.  Reports first colonoscopy had 2 polyps though since then he has been clean.  Patient does ask about whether or not he should be on 81 mg aspirin daily.  Discussed that the data that has recently come out regarding the risks and benefits of cardiovascular issues is relatively equal to risk of bleeding.  Discussed that there is a benefit for colorectal cancer prevention with aspirin as well.  He notes no bleeding issues.  PMH: nonsmoker.   ROS see history of present illness  Objective  Physical Exam Vitals:   10/23/17 0839  BP: (!) 158/90  Pulse: 72  Temp: 98.1 F (36.7 C)  SpO2: 99%    BP Readings from Last 3 Encounters:  10/23/17 (!) 158/90  09/11/17 134/80  08/13/17 138/82   Wt Readings from Last 3 Encounters:  10/23/17 228 lb (103.4 kg)  07/29/17 229 lb 9.6 oz (104.1 kg)  01/28/15 211 lb (95.7 kg)    Physical Exam  Constitutional: No distress.  Cardiovascular: Normal rate, regular rhythm and normal heart sounds.  Pulmonary/Chest: Effort normal and breath sounds normal.  Musculoskeletal: He exhibits no edema.  Neurological: He is alert. Gait normal.  Skin: He is not diaphoretic.     Assessment/Plan: Please see individual problem list.  Hypertension Above goal today though previously well controlled at home.  He will continue his current regimen and start checking at home.  If consistently greater than 130/80 at home we will add an  additional medication such as amlodipine or increase his current medications.  He will contact us in 2 weeks with his readings.  Hyperlipidemia Continue Crestor.  Family history of colon cancer in father Father with colon cancer.  Patient is up-to-date on colonoscopy.  Discussed benefits of primary prevention with aspirin related to colorectal cancer.   Orders Placed This Encounter  Procedures  . Flu Vaccine QUAD 36+ mos IM    No orders of the defined types were placed in this encounter.  Discussed possible risks and benefits of low-dose aspirin.  Patient will continue on this at this time.   [name], MD [place]

## 2017-11-02 ENCOUNTER — Encounter: Payer: Self-pay | Admitting: Family Medicine

## 2018-04-22 ENCOUNTER — Encounter: Payer: Self-pay | Admitting: Family Medicine

## 2018-04-22 ENCOUNTER — Other Ambulatory Visit: Payer: Self-pay

## 2018-04-22 ENCOUNTER — Ambulatory Visit (INDEPENDENT_AMBULATORY_CARE_PROVIDER_SITE_OTHER): Payer: Medicare Other | Admitting: Family Medicine

## 2018-04-22 VITALS — BP 140/80 | HR 61 | Temp 98.0°F | Ht 72.0 in | Wt 228.6 lb

## 2018-04-22 DIAGNOSIS — R7309 Other abnormal glucose: Secondary | ICD-10-CM

## 2018-04-22 DIAGNOSIS — I10 Essential (primary) hypertension: Secondary | ICD-10-CM | POA: Diagnosis not present

## 2018-04-22 DIAGNOSIS — E785 Hyperlipidemia, unspecified: Secondary | ICD-10-CM | POA: Diagnosis not present

## 2018-04-22 DIAGNOSIS — Z800 Family history of malignant neoplasm of digestive organs: Secondary | ICD-10-CM

## 2018-04-22 MED ORDER — PNEUMOCOCCAL 13-VAL CONJ VACC IM SUSP: 0.5000 mL | Freq: Once | INTRAMUSCULAR | 0 refills | Status: AC

## 2018-04-22 MED ORDER — TETANUS-DIPHTH-ACELL PERTUSSIS 5-2.5-18.5 LF-MCG/0.5 IM SUSP: 0.5000 mL | Freq: Once | INTRAMUSCULAR | 0 refills | Status: AC

## 2018-04-22 NOTE — Assessment & Plan Note (Signed)
Well-controlled at home.  He will continue his current regimen.  Check lab work today.

## 2018-04-22 NOTE — Assessment & Plan Note (Signed)
Continue Crestor.  Check lab work. 

## 2018-04-22 NOTE — Progress Notes (Signed)
  [name], MD Phone: [phone]  Ronald Hunter is a 67 y.o. male who presents today for f/u.  HYPERTENSION  Disease Monitoring  Home BP Monitoring 120s-130s/80s Chest pain- no    Dyspnea- no Medications  Compliance-  Taking lisinopril, HCTZ.  Edema- no  HYPERLIPIDEMIA Symptoms Chest pain on exertion:  no  Medications: Compliance- taking crestor Right upper quadrant pain- no  Muscle aches- no  Patient reports he continues on aspirin 81 mg daily for primary prevention of cardiac issues as well as colorectal cancer benefit.  His father had colorectal cancer.  He has been undergoing colonoscopies every 5 years.  Had polyps previously.  No blood in his stool.  He has not had any bleeding issues on the aspirin.  He takes a rare 500 mg aspirin when he has a headache though he does not take the 81 mg aspirin on those days.  He very rarely has headaches.    Social History   Tobacco Use  Smoking Status Never Smoker  Smokeless Tobacco Never Used     ROS see history of present illness  Objective  Physical Exam Vitals:   04/22/18 0803  BP: 140/80  Pulse: 61  Temp: 98 F (36.7 C)  SpO2: 98%    BP Readings from Last 3 Encounters:  04/22/18 140/80  10/23/17 (!) 158/90  09/11/17 134/80   Wt Readings from Last 3 Encounters:  04/22/18 228 lb 9.6 oz (103.7 kg)  10/23/17 228 lb (103.4 kg)  07/29/17 229 lb 9.6 oz (104.1 kg)    Physical Exam  Constitutional: No distress.  Cardiovascular: Normal rate, regular rhythm and normal heart sounds.  Pulmonary/Chest: Effort normal and breath sounds normal.  Musculoskeletal: He exhibits no edema.  Neurological: He is alert.  Skin: Skin is warm and dry. He is not diaphoretic.     Assessment/Plan: Please see individual problem list.  Hypertension Well-controlled at home.  He will continue his current regimen.  Check lab work today.  Hyperlipidemia Continue Crestor.  Check lab work.  Family history of colon cancer in  father Discussed risks and benefits of aspirin used for primary prevention of cardiovascular issues as well as colorectal cancer.  Patient has no bleeding issues.  For now will stay on 81 mg of aspirin.  He will not take this when he takes aspirin for headaches.  He will have his colonoscopy later this year and he was informed that September is his recall date.   Health Maintenance: Prescriptions for Prevnar and Tdap given.  Patient declined hepatitis C and HIV screening.  Orders Placed This Encounter  Procedures  . Comp Met (CMET)  . HgB A1c  . Lipid panel    Meds ordered this encounter  Medications  . pneumococcal 13-valent conjugate vaccine (PREVNAR 13) SUSP injection    Sig: Inject 0.5 mLs into the muscle once for 1 dose.    Dispense:  0.5 mL    Refill:  0  . Tdap (BOOSTRIX) 5-2.5-18.5 LF-MCG/0.5 injection    Sig: Inject 0.5 mLs into the muscle once for 1 dose.    Dispense:  0.5 mL    Refill:  0     [name], MD [place]

## 2018-04-22 NOTE — Assessment & Plan Note (Signed)
Discussed risks and benefits of aspirin used for primary prevention of cardiovascular issues as well as colorectal cancer.  Patient has no bleeding issues.  For now will stay on 81 mg of aspirin.  He will not take this when he takes aspirin for headaches.  He will have his colonoscopy later this year and he was informed that September is his recall date.

## 2018-04-22 NOTE — Patient Instructions (Signed)
Nice to see you. Please get the vaccinations for pneumonia and tetanus. Prescriptions have been provided.  Please make sure you have your colonoscopy later this year.

## 2018-04-23 LAB — COMPREHENSIVE METABOLIC PANEL
AG Ratio: 1.9 (calc) (ref 1.0–2.5)
ALBUMIN MSPROF: 4.3 g/dL (ref 3.6–5.1)
ALKALINE PHOSPHATASE (APISO): 67 U/L (ref 40–115)
ALT: 24 U/L (ref 9–46)
AST: 21 U/L (ref 10–35)
BUN: 13 mg/dL (ref 7–25)
CALCIUM: 9.2 mg/dL (ref 8.6–10.3)
CHLORIDE: 105 mmol/L (ref 98–110)
CO2: 25 mmol/L (ref 20–32)
Creat: 0.78 mg/dL (ref 0.70–1.25)
Globulin: 2.3 g/dL (calc) (ref 1.9–3.7)
Glucose, Bld: 100 mg/dL — ABNORMAL HIGH (ref 65–99)
POTASSIUM: 4.4 mmol/L (ref 3.5–5.3)
SODIUM: 141 mmol/L (ref 135–146)
Total Bilirubin: 0.4 mg/dL (ref 0.2–1.2)
Total Protein: 6.6 g/dL (ref 6.1–8.1)

## 2018-04-23 LAB — LIPID PANEL
CHOLESTEROL: 178 mg/dL (ref <200)
HDL: 47 mg/dL (ref >40)
LDL Cholesterol (Calc): 113 mg/dL (calc) — ABNORMAL HIGH
Non-HDL Cholesterol (Calc): 131 mg/dL (calc) — ABNORMAL HIGH (ref <130)
Total CHOL/HDL Ratio: 3.8 (calc) (ref <5.0)
Triglycerides: 83 mg/dL (ref <150)

## 2018-04-23 LAB — HEMOGLOBIN A1C
Hgb A1c MFr Bld: 5.6 % of total Hgb (ref <5.7)
Mean Plasma Glucose: 114 (calc)
eAG (mmol/L): 6.3 (calc)

## 2018-04-24 DIAGNOSIS — Z23 Encounter for immunization: Secondary | ICD-10-CM | POA: Diagnosis not present

## 2018-05-09 ENCOUNTER — Other Ambulatory Visit: Payer: Self-pay

## 2018-05-09 ENCOUNTER — Telehealth: Payer: Self-pay | Admitting: Family Medicine

## 2018-05-09 DIAGNOSIS — Z1211 Encounter for screening for malignant neoplasm of colon: Secondary | ICD-10-CM

## 2018-05-09 NOTE — Telephone Encounter (Signed)
Referral placed.

## 2018-05-09 NOTE — Telephone Encounter (Signed)
Please advise 

## 2018-05-09 NOTE — Telephone Encounter (Signed)
Copied from [place] [id number]. Topic: Referral - Request >> May 09, 2018 11:36 AM Margot Ables wrote: Reason for CRM: Pt contacted [facility] for colonoscopy. Please enter/send Epic referral.

## 2018-05-13 ENCOUNTER — Other Ambulatory Visit: Payer: Self-pay

## 2018-05-13 DIAGNOSIS — Z800 Family history of malignant neoplasm of digestive organs: Secondary | ICD-10-CM

## 2018-07-31 ENCOUNTER — Other Ambulatory Visit: Payer: Self-pay | Admitting: Family Medicine

## 2018-08-11 ENCOUNTER — Other Ambulatory Visit: Payer: Self-pay | Admitting: Family Medicine

## 2018-08-25 ENCOUNTER — Telehealth: Payer: Self-pay | Admitting: Gastroenterology

## 2018-08-25 NOTE — Telephone Encounter (Signed)
Pt left vm he has questions about his prep for procedure 08/26/18 and insurance please call pt

## 2018-08-25 NOTE — Telephone Encounter (Signed)
Spoke with pt regarding his upcoming procedure and instructed pt on how to take his Suprep bowel prep.

## 2018-08-26 ENCOUNTER — Encounter
Admission: RE | Disposition: A | Discharge status: Home / Self Care | Payer: Self-pay | Source: Ambulatory Visit | Attending: Gastroenterology

## 2018-08-26 ENCOUNTER — Encounter: Payer: Self-pay | Admitting: Anesthesiology

## 2018-08-26 ENCOUNTER — Ambulatory Visit: Payer: Medicare Other | Admitting: Certified Registered"

## 2018-08-26 ENCOUNTER — Ambulatory Visit
Admission: RE | Admit: 2018-08-26 | Discharge: 2018-08-26 | Disposition: A | Discharge status: Home / Self Care | Payer: Medicare Other | Source: Ambulatory Visit | Attending: Gastroenterology | Admitting: Gastroenterology

## 2018-08-26 DIAGNOSIS — D123 Benign neoplasm of transverse colon: Secondary | ICD-10-CM

## 2018-08-26 DIAGNOSIS — I10 Essential (primary) hypertension: Secondary | ICD-10-CM | POA: Insufficient documentation

## 2018-08-26 DIAGNOSIS — Z800 Family history of malignant neoplasm of digestive organs: Secondary | ICD-10-CM | POA: Insufficient documentation

## 2018-08-26 DIAGNOSIS — D128 Benign neoplasm of rectum: Secondary | ICD-10-CM | POA: Diagnosis not present

## 2018-08-26 DIAGNOSIS — D122 Benign neoplasm of ascending colon: Secondary | ICD-10-CM | POA: Diagnosis not present

## 2018-08-26 DIAGNOSIS — K621 Rectal polyp: Secondary | ICD-10-CM

## 2018-08-26 DIAGNOSIS — K640 First degree hemorrhoids: Secondary | ICD-10-CM | POA: Diagnosis not present

## 2018-08-26 DIAGNOSIS — F419 Anxiety disorder, unspecified: Secondary | ICD-10-CM | POA: Diagnosis not present

## 2018-08-26 DIAGNOSIS — E785 Hyperlipidemia, unspecified: Secondary | ICD-10-CM | POA: Insufficient documentation

## 2018-08-26 DIAGNOSIS — Z7982 Long term (current) use of aspirin: Secondary | ICD-10-CM | POA: Insufficient documentation

## 2018-08-26 DIAGNOSIS — Z87442 Personal history of urinary calculi: Secondary | ICD-10-CM | POA: Insufficient documentation

## 2018-08-26 DIAGNOSIS — Z79899 Other long term (current) drug therapy: Secondary | ICD-10-CM | POA: Diagnosis not present

## 2018-08-26 DIAGNOSIS — K635 Polyp of colon: Secondary | ICD-10-CM | POA: Diagnosis not present

## 2018-08-26 DIAGNOSIS — Z1211 Encounter for screening for malignant neoplasm of colon: Secondary | ICD-10-CM | POA: Diagnosis not present

## 2018-08-26 HISTORY — PX: COLONOSCOPY WITH PROPOFOL: SHX5780

## 2018-08-26 SURGERY — COLONOSCOPY WITH PROPOFOL
Anesthesia: General

## 2018-08-26 MED ORDER — PROPOFOL 500 MG/50ML IV EMUL
INTRAVENOUS | Status: DC | PRN
  Administered 2018-08-26: 130 ug/kg/min via INTRAVENOUS

## 2018-08-26 MED ORDER — SODIUM CHLORIDE 0.9 % IV SOLN
INTRAVENOUS | Status: DC
  Administered 2018-08-26: 1000 mL via INTRAVENOUS

## 2018-08-26 MED ORDER — LIDOCAINE HCL (PF) 1 % IJ SOLN
2.0000 mL | Freq: Once | INTRAMUSCULAR | Status: AC
  Administered 2018-08-26: 0.3 mL via INTRADERMAL

## 2018-08-26 MED ORDER — LIDOCAINE HCL (PF) 1 % IJ SOLN
INTRAMUSCULAR | Status: AC
  Administered 2018-08-26: 0.3 mL via INTRADERMAL
  Filled 2018-08-26: qty 2

## 2018-08-26 MED ORDER — PROPOFOL 10 MG/ML IV BOLUS
INTRAVENOUS | Status: DC | PRN
  Administered 2018-08-26 (×2): 40 mg via INTRAVENOUS
  Administered 2018-08-26: 100 mg via INTRAVENOUS

## 2018-08-26 MED ORDER — LIDOCAINE HCL (CARDIAC) PF 100 MG/5ML IV SOSY
PREFILLED_SYRINGE | INTRAVENOUS | Status: DC | PRN
  Administered 2018-08-26: 50 mg via INTRAVENOUS

## 2018-08-26 MED ORDER — PHENYLEPHRINE HCL 10 MG/ML IJ SOLN
INTRAMUSCULAR | Status: AC
  Filled 2018-08-26: qty 1

## 2018-08-26 MED ORDER — PROPOFOL 500 MG/50ML IV EMUL
INTRAVENOUS | Status: AC
  Filled 2018-08-26: qty 50

## 2018-08-26 MED ORDER — LIDOCAINE HCL (PF) 2 % IJ SOLN
INTRAMUSCULAR | Status: AC
  Filled 2018-08-26: qty 10

## 2018-08-26 NOTE — Anesthesia Procedure Notes (Signed)
Performed by: Susanne Baumgarner, CRNA Pre-anesthesia Checklist: Patient identified, Emergency Drugs available, Suction available, Patient being monitored and Timeout performed Patient Re-evaluated:Patient Re-evaluated prior to induction Oxygen Delivery Method: Nasal cannula Induction Type: IV induction       

## 2018-08-26 NOTE — Op Note (Signed)
[facility] Patient Name: Ronald Hunter Procedure Date: 08/26/2018 7:29 AM MRN: [id number] Account #: [id number] Date of Birth: 09-25-1951 Admit Type: Outpatient Age: 67 Room: [facility] ENDO ROOM 4 Gender: Male Note Status: Finalized Procedure:            Colonoscopy Indications:          Family history of anal canal cancer in a first-degree                        relative Providers:            Lucilla Lame MD, MD Referring MD:         Angela Adam. Caryl Bis (Referring MD) Medicines:            Monitored Anesthesia Care Complications:        No immediate complications. Procedure:            Pre-Anesthesia Assessment:                       - Prior to the procedure, a History and Physical was                        performed, and patient medications and allergies were                        reviewed. The patient's tolerance of previous                        anesthesia was also reviewed. The risks and benefits of                        the procedure and the sedation options and risks were                        discussed with the patient. All questions were                        answered, and informed consent was obtained. Prior                        Anticoagulants: The patient has taken no previous                        anticoagulant or antiplatelet agents. ASA Grade                        Assessment: II - A patient with mild systemic disease.                        After reviewing the risks and benefits, the patient was                        deemed in satisfactory condition to undergo the                        procedure.                       After obtaining informed consent, the colonoscope was  passed under direct vision. Throughout the procedure,                        the patient's blood pressure, pulse, and oxygen                        saturations were monitored continuously. The                        Colonoscope was  introduced through the anus and                        advanced to the the cecum, identified by appendiceal                        orifice and ileocecal valve. The colonoscopy was                        performed without difficulty. The patient tolerated the                        procedure well. The quality of the bowel preparation                        was excellent. Findings:      The perianal and digital rectal examinations were normal.      A 6 mm polyp was found in the ascending colon. The polyp was sessile.       The polyp was removed with a cold snare. Resection and retrieval were       complete.      Two sessile polyps were found in the transverse colon. The polyps were 3       to 5 mm in size. These polyps were removed with a cold snare. Resection       and retrieval were complete.      A 4 mm polyp was found in the rectum. The polyp was sessile. The polyp       was removed with a cold snare. Resection and retrieval were complete.      Non-bleeding internal hemorrhoids were found during retroflexion. The       hemorrhoids were Grade I (internal hemorrhoids that do not prolapse). Impression:           - One 6 mm polyp in the ascending colon, removed with a                        cold snare. Resected and retrieved.                       - Two 3 to 5 mm polyps in the transverse colon, removed                        with a cold snare. Resected and retrieved.                       - One 4 mm polyp in the rectum, removed with a cold                        snare. Resected and retrieved.                       -  Non-bleeding internal hemorrhoids. Recommendation:       - Discharge patient to home.                       - Resume previous diet.                       - Continue present medications.                       - Await pathology results.                       - Repeat colonoscopy in 5 years for surveillance. Procedure Code(s):    --- Professional ---                       [phone],  Colonoscopy, flexible; with removal of tumor(s),                        polyp(s), or other lesion(s) by snare technique Diagnosis Code(s):    --- Professional ---                       Z80.0, Family history of malignant neoplasm of                        digestive organs                       D12.2, Benign neoplasm of ascending colon                       K62.1, Rectal polyp                       D12.3, Benign neoplasm of transverse colon (hepatic                        flexure or splenic flexure) CPT copyright 2017 American Medical Association. All rights reserved. The codes documented in this report are preliminary and upon coder review may  be revised to meet current compliance requirements. Lucilla Lame MD, MD 08/26/2018 8:32:22 AM This report has been signed electronically. Number of Addenda: 0 Note Initiated On: 08/26/2018 7:29 AM Scope Withdrawal Time: 0 hours 12 minutes 13 seconds  Total Procedure Duration: 0 hours 21 minutes 43 seconds       [facility]

## 2018-08-26 NOTE — H&P (Signed)
Lucilla Lame, MD [facility] [address], [place], [place] [address] Phone:[phone] Fax : [phone]  Primary Care Physician:  Leone Haven, MD Primary Gastroenterologist:  Dr. Allen Norris  Pre-Procedure History & Physical: HPI:  Ronald Hunter is a 67 y.o. male is here for an colonoscopy.   Past Medical History:  Diagnosis Date  . Anxiety   . Arthritis   . Chronic back pain    stenosis/spondylosis  . History of bronchitis    as a child  . History of colon polyps   . History of kidney stones   . Hyperlipidemia    takes Crestor daily  . Hypertension    takes Metoprolol daily  . PONV (postoperative nausea and vomiting)    Nausea with one surgery    Past Surgical History:  Procedure Laterality Date  . COLONOSCOPY    . COLONOSCOPY     several  . COLONOSCOPY W/ POLYPECTOMY    . cyst removed from under left arm    . KNEE ARTHROSCOPY     right   . KNEE ARTHROSCOPY W/ ACL RECONSTRUCTION    . LUMBAR LAMINECTOMY/DECOMPRESSION MICRODISCECTOMY  08/11/2012   Procedure: LUMBAR LAMINECTOMY/DECOMPRESSION MICRODISCECTOMY 1 LEVEL;  Surgeon: Hosie Spangle, MD;  Location: [place] [facility];  Service: Neurosurgery;  Laterality: Right;  RIGHT L45 laminotomy and microdiskectomy  . LUMBAR LAMINECTOMY/DECOMPRESSION MICRODISCECTOMY Right 01/29/2015   Procedure: LUMBAR LAMINECTOMY/DECOMPRESSION MICRODISCECTOMY 1 LEVEL;  Surgeon: Hosie Spangle, MD;  Location: [place] [facility];  Service: Neurosurgery;  Laterality: Right;  Right L45 Laminotomy and microdisketomy  . SHOULDER SURGERY     right   . TONSILLECTOMY      Prior to Admission medications   Medication Sig Start Date End Date Taking? Authorizing Provider  aspirin 81 MG tablet Take 81 mg by mouth daily.    [provider]  lisinopril-hydrochlorothiazide Reita May) 20-12.5 MG tablet  07/29/17   [provider]  lisinopril-hydrochlorothiazide (PRINZIDE,ZESTORETIC) 20-25 MG tablet TAKE 1 TABLET BY MOUTH ONCE  DAILY 08/11/18   Leone Haven, MD  Multiple Vitamin (MULTIVITAMIN WITH MINERALS) TABS Take 1 tablet by mouth daily.    [provider]  rosuvastatin (CRESTOR) 40 MG tablet TAKE 1 TABLET BY MOUTH ONCE DAILY 07/31/18   Leone Haven, MD    Allergies as of 05/13/2018  . (No Known Allergies)    Family History  Problem Relation Age of Onset  . Hyperlipidemia Mother   . Heart disease Mother   . Congestive Heart Failure Mother   . Colon cancer Father     Social History   Socioeconomic History  . Marital status: Married    Spouse name: Not on file  . Number of children: Not on file  . Years of education: Not on file  . Highest education level: Not on file  Occupational History  . Not on file  Social Needs  . Financial resource strain: Not on file  . Food insecurity:    Worry: Not on file    Inability: Not on file  . Transportation needs:    Medical: Not on file    Non-medical: Not on file  Tobacco Use  . Smoking status: Never Smoker  . Smokeless tobacco: Never Used  Substance and Sexual Activity  . Alcohol use: Yes    Alcohol/week: 16.0 standard drinks    Types: 14 Glasses of wine, 2 Shots of liquor per week  . Drug use: No  . Sexual activity: Yes  Lifestyle  . Physical  activity:    Days per week: Not on file    Minutes per session: Not on file  . Stress: Not on file  Relationships  . Social connections:    Talks on phone: Not on file    Gets together: Not on file    Attends religious service: Not on file    Active member of club or organization: Not on file    Attends meetings of clubs or organizations: Not on file    Relationship status: Not on file  . Intimate partner violence:    Fear of current or ex partner: Not on file    Emotionally abused: Not on file    Physically abused: Not on file    Forced sexual activity: Not on file  Other Topics Concern  . Not on file  Social History Narrative  . Not on file    Review of Systems: See HPI,  otherwise negative ROS  Physical Exam: BP (!) 151/93   Pulse 93   Temp (!) 96.8 F (36 C) (Tympanic)   Resp 17   Ht 6' (1.829 m)   Wt 102.1 kg   SpO2 100%   BMI 30.52 kg/m  General:   Alert,  pleasant and cooperative in NAD Head:  Normocephalic and atraumatic. Neck:  Supple; no masses or thyromegaly. Lungs:  Clear throughout to auscultation.    Heart:  Regular rate and rhythm. Abdomen:  Soft, nontender and nondistended. Normal bowel sounds, without guarding, and without rebound.   Neurologic:  Alert and  oriented x4;  grossly normal neurologically.  Impression/Plan: Ronald Hunter is here for an colonoscopy to be performed for family history of colon cancer.  Risks, benefits, limitations, and alternatives regarding  colonoscopy have been reviewed with the patient.  Questions have been answered.  All parties agreeable.   Lucilla Lame, MD  08/26/2018, 8:00 AM

## 2018-08-26 NOTE — Anesthesia Post-op Follow-up Note (Signed)
Anesthesia QCDR form completed.        

## 2018-08-26 NOTE — Transfer of Care (Signed)
Immediate Anesthesia Transfer of Care Note  Patient: Ronald Hunter  Procedure(s) Performed: COLONOSCOPY WITH PROPOFOL (N/A )  Patient Location: PACU  Anesthesia Type:General  Level of Consciousness: sedated  Airway & Oxygen Therapy: Patient Spontanous Breathing and Patient connected to nasal cannula oxygen  Post-op Assessment: Report given to RN and Post -op Vital signs reviewed and stable  Post vital signs: Reviewed and stable  Last Vitals:  Vitals Value Taken Time  BP 119/84 08/26/2018  8:34 AM  Temp 35.7 C 08/26/2018  8:30 AM  Pulse 73 08/26/2018  8:34 AM  Resp 20 08/26/2018  8:34 AM  SpO2 97 % 08/26/2018  8:34 AM    Last Pain:  Vitals:   08/26/18 0830  TempSrc: Tympanic  PainSc:          Complications: No apparent anesthesia complications

## 2018-08-26 NOTE — Anesthesia Preprocedure Evaluation (Signed)
Anesthesia Evaluation  Patient identified by MRN, date of birth, ID band Patient awake    Reviewed: Allergy & Precautions, NPO status , Patient's Chart, lab work & pertinent test results, reviewed documented beta blocker date and time   History of Anesthesia Complications (+) PONV and history of anesthetic complications  Airway Mallampati: III  TM Distance: >3 FB Neck ROM: Full    Dental  (+) Teeth Intact, Dental Advisory Given   Pulmonary neg pulmonary ROS,    breath sounds clear to auscultation- rhonchi       Cardiovascular hypertension, Pt. on medications and Pt. on home beta blockers  Rhythm:Regular Rate:Normal     Neuro/Psych negative neurological ROS     GI/Hepatic negative GI ROS, Neg liver ROS,   Endo/Other  negative endocrine ROS  Renal/GU negative Renal ROS     Musculoskeletal  (+) Arthritis ,   Abdominal   Peds  Hematology negative hematology ROS (+)   Anesthesia Other Findings   Reproductive/Obstetrics                             Anesthesia Physical  Anesthesia Plan  ASA: II  Anesthesia Plan: General   Post-op Pain Management:    Induction: Intravenous  PONV Risk Score and Plan:   Airway Management Planned: Nasal Cannula  Additional Equipment:   Intra-op Plan:   Post-operative Plan:   Informed Consent: I have reviewed the patients History and Physical, chart, labs and discussed the procedure including the risks, benefits and alternatives for the proposed anesthesia with the patient or authorized representative who has indicated his/her understanding and acceptance.   Dental advisory given  Plan Discussed with: CRNA  Anesthesia Plan Comments:         Anesthesia Quick Evaluation

## 2018-08-27 ENCOUNTER — Encounter: Payer: Self-pay | Admitting: Gastroenterology

## 2018-08-27 LAB — SURGICAL PATHOLOGY

## 2018-08-27 NOTE — Anesthesia Postprocedure Evaluation (Signed)
Anesthesia Post Note  Patient: Ronald Hunter  Procedure(s) Performed: COLONOSCOPY WITH PROPOFOL (N/A )  Patient location during evaluation: PACU Anesthesia Type: General Level of consciousness: awake and alert and oriented Pain management: pain level controlled Vital Signs Assessment: post-procedure vital signs reviewed and stable Respiratory status: spontaneous breathing Cardiovascular status: blood pressure returned to baseline Anesthetic complications: no     Last Vitals:  Vitals:   08/26/18 0850 08/26/18 0900  BP: (!) 144/86 (!) 150/86  Pulse: 71 67  Resp: 17 18  Temp:    SpO2: 100% 99%    Last Pain:  Vitals:   08/27/18 0750  TempSrc:   PainSc: 0-No pain                 Wallis Spizzirri

## 2018-08-28 ENCOUNTER — Encounter: Payer: Self-pay | Admitting: Gastroenterology

## 2018-09-10 ENCOUNTER — Telehealth: Payer: Self-pay | Admitting: Gastroenterology

## 2018-09-10 NOTE — Telephone Encounter (Signed)
Pt is calling for  Ginger he is wanting some clarification from Dr. Allen Norris  About what was prev. Discussed with Ginger when he came into the office last week

## 2018-09-10 NOTE — Telephone Encounter (Signed)
Spoke with pt regarding the guidelines for repeating a colonoscopy. Dr. Allen Norris printed out the guidelines for me to give the pt for his records.

## 2018-10-27 ENCOUNTER — Encounter: Payer: Self-pay | Admitting: Family Medicine

## 2018-10-27 ENCOUNTER — Ambulatory Visit (INDEPENDENT_AMBULATORY_CARE_PROVIDER_SITE_OTHER): Payer: Medicare Other | Admitting: Family Medicine

## 2018-10-27 VITALS — BP 140/70 | HR 64 | Temp 97.9°F | Ht 72.0 in | Wt 229.0 lb

## 2018-10-27 DIAGNOSIS — E785 Hyperlipidemia, unspecified: Secondary | ICD-10-CM | POA: Diagnosis not present

## 2018-10-27 DIAGNOSIS — M545 Low back pain, unspecified: Secondary | ICD-10-CM

## 2018-10-27 DIAGNOSIS — G8929 Other chronic pain: Secondary | ICD-10-CM

## 2018-10-27 DIAGNOSIS — Z23 Encounter for immunization: Secondary | ICD-10-CM

## 2018-10-27 DIAGNOSIS — I10 Essential (primary) hypertension: Secondary | ICD-10-CM | POA: Diagnosis not present

## 2018-10-27 NOTE — Assessment & Plan Note (Signed)
Chronic issue with current flare.  Discussed potential for other medications though he prefers to limit medication use currently.  He will try to stay as active as possible.  Return precautions in AVS.

## 2018-10-27 NOTE — Progress Notes (Signed)
  Ronald Rumps, MD Phone: [phone]  SY SAINTJEAN is a 67 y.o. male who presents today for f/u.  CC: htn, hld, chronic back pain  HYPERTENSION  Disease Monitoring  Home BP Monitoring not checking consistently Chest pain- no    Dyspnea- no Medications  Compliance-  Taking lisinopril/hctz.  Edema- no  HYPERLIPIDEMIA Symptoms Chest pain on exertion:  no   Leg claudication:   no Medications: Compliance- taking crestor Right upper quadrant pain- no  Muscle aches- no  Chronic low back pain: Patient notes flared starting on Friday.  He notes no specific injury.  Notes left lower back discomfort that is radiating to his buttock at times.  Feels like a nerve type pain.  He has had 2 surgeries in the past.  He has right dropfoot related to the injury just prior to his most recent surgery in 2016.  He notes no numbness though occasionally he will have random tingling in his legs.  No weakness other than the dropfoot.  No incontinence.  He will take a 500 mg aspirin for pain.  He notes no other medicines have been helpful in the past.  He follows up with his surgeon as needed.    Social History   Tobacco Use  Smoking Status Never Smoker  Smokeless Tobacco Never Used     ROS see history of present illness  Objective  Physical Exam Vitals:   10/27/18 0802  BP: 140/70  Pulse: 64  Temp: 97.9 F (36.6 C)  SpO2: 98%    BP Readings from Last 3 Encounters:  10/27/18 140/70  08/26/18 (!) 150/86  04/22/18 140/80   Wt Readings from Last 3 Encounters:  10/27/18 229 lb (103.9 kg)  08/26/18 225 lb (102.1 kg)  04/22/18 228 lb 9.6 oz (103.7 kg)    Physical Exam  Constitutional: No distress.  Cardiovascular: Normal rate, regular rhythm and normal heart sounds.  Pulmonary/Chest: Effort normal and breath sounds normal.  Musculoskeletal: He exhibits no edema.  No midline spine tenderness, no midline spine step-off, no muscular back tenderness  Neurological: He is alert.  4+/5  right dorsiflexion, 5/5 strength bilateral quads, hamstrings, plantar flexion, and left dorsiflexion, sensation light touch intact bilateral lower extremities  Skin: Skin is warm and dry. He is not diaphoretic.     Assessment/Plan: Please see individual problem list.  Hypertension Adequately controlled.  Continue current regimen.  Check BMP.  Hyperlipidemia Check LDL.  Continue Crestor.  Chronic low back pain Chronic issue with current flare.  Discussed potential for other medications though he prefers to limit medication use currently.  He will try to stay as active as possible.  Return precautions in AVS.   Orders Placed This Encounter  Procedures  . Flu vaccine HIGH DOSE PF (Fluzone High dose)  . Basic Metabolic Panel (BMET)  . Direct LDL    No orders of the defined types were placed in this encounter.    Ronald Rumps, MD [place]

## 2018-10-27 NOTE — Patient Instructions (Signed)
Nice to see you. Please try to check your blood pressure periodically at home. We will check lab work today and contact you with the results. If you develop worsening back pain, numbness, weakness, or bowel or bladder dysfunction please seek medical attention.

## 2018-10-27 NOTE — Assessment & Plan Note (Signed)
Adequately controlled.  Continue current regimen.  Check BMP. 

## 2018-10-27 NOTE — Assessment & Plan Note (Signed)
Check LDL.  Continue Crestor. 

## 2018-10-28 LAB — BASIC METABOLIC PANEL
BUN / CREAT RATIO: 17 (calc) (ref 6–22)
BUN: 11 mg/dL (ref 7–25)
CALCIUM: 9.6 mg/dL (ref 8.6–10.3)
CHLORIDE: 103 mmol/L (ref 98–110)
CO2: 28 mmol/L (ref 20–32)
Creat: 0.66 mg/dL — ABNORMAL LOW (ref 0.70–1.25)
GLUCOSE: 97 mg/dL (ref 65–99)
Potassium: 4.6 mmol/L (ref 3.5–5.3)
Sodium: 140 mmol/L (ref 135–146)

## 2018-10-28 LAB — LDL CHOLESTEROL, DIRECT: Direct LDL: 128 mg/dL — ABNORMAL HIGH (ref <100)

## 2018-12-29 ENCOUNTER — Ambulatory Visit (INDEPENDENT_AMBULATORY_CARE_PROVIDER_SITE_OTHER): Payer: Medicare Other | Admitting: Podiatry

## 2018-12-29 ENCOUNTER — Encounter: Payer: Self-pay | Admitting: Podiatry

## 2018-12-29 DIAGNOSIS — L600 Ingrowing nail: Secondary | ICD-10-CM

## 2018-12-29 MED ORDER — NEOMYCIN-POLYMYXIN-HC 1 % OT SOLN: OTIC | 1 refills | Status: DC

## 2018-12-29 NOTE — Progress Notes (Signed)
He presents today chief complaint of a painful hallux nail right.  States that she did have this taken off many years ago.  I like to go ahead and have this removed if at all possible.  He denies fever chills nausea vomiting muscle aches pains calf pain back pain chest pain shortness of breath.  Review of systems unremarkable.  Objective: Vital signs are stable he is alert and oriented x3 have reviewed his past medical history medications allergies surgery social history review of systems.  Pulses are strongly palpable capillary fill time is immediate.  Neurologic sensorium is intact per Semmes Weinstein monofilament and deep tendon reflexes are intact.  Muscle strength is 5/5 dorsiflexors plantar flexors inverters everters onto the musculature is intact.  Orthopedic evaluation demonstrates all joints display full range of motion with rectus foot type.  Cutaneous evaluation demonstrates supple well-hydrated cutis no erythema edema cellulitis drainage or odor thick yellow dystrophic clinically mycotic nail painful on palpation with mild proximal erythema no purulence no malodor.  Assessment: Pain in limb hallux nail plate right.  Plan: Total permanent nail avulsion hallux right chemical matricectomy performed.  Tolerated procedure well without complications.  He was given both oral and written home-going strips for the care and soaking of the toe as well as prescription for Cortisporin Otic to be applied twice daily after soaking.  I will follow-up with him in about 2 weeks to make sure he is doing well if there is any questions or concerns he will notify me immediately.

## 2018-12-29 NOTE — Patient Instructions (Signed)

## 2019-01-14 ENCOUNTER — Ambulatory Visit (INDEPENDENT_AMBULATORY_CARE_PROVIDER_SITE_OTHER): Payer: Medicare Other | Admitting: Podiatry

## 2019-01-14 ENCOUNTER — Encounter: Payer: Self-pay | Admitting: Podiatry

## 2019-01-14 DIAGNOSIS — Z9889 Other specified postprocedural states: Secondary | ICD-10-CM

## 2019-01-14 DIAGNOSIS — L600 Ingrowing nail: Secondary | ICD-10-CM

## 2019-01-14 NOTE — Progress Notes (Signed)
He presents today for a follow-up of his nail avulsion he states that last night it was just painfully excruciating.  Objective: Vital signs are stable he is alert and oriented x3 nail bed appears to be healing very nicely hallux right.  There is no erythema cellulitis drainage or odor.  Fibrin deposition is present some granulation tissue with epithelialization is present.  Assessment: Well-healing nailbed.  Plan: Encouraged him to soak once a day once every other day apply a small amount of Aquaphor ointment cover during the day but leave open at bedtime.

## 2019-01-14 NOTE — Patient Instructions (Signed)

## 2019-01-28 ENCOUNTER — Encounter: Payer: Self-pay | Admitting: Podiatry

## 2019-01-28 ENCOUNTER — Ambulatory Visit (INDEPENDENT_AMBULATORY_CARE_PROVIDER_SITE_OTHER): Payer: Medicare Other | Admitting: Podiatry

## 2019-01-28 DIAGNOSIS — L600 Ingrowing nail: Secondary | ICD-10-CM | POA: Diagnosis not present

## 2019-01-28 NOTE — Progress Notes (Signed)
He presents today for follow-up of his matrixectomy hallux right states that is doing better.  He states is still little bit tender.  Continues to apply the Aquaphor to the area and cover during the day.  Objective: Vital signs are stable he is alert and oriented x3.  There is no erythema edema cellulitis drainage or odor nailbed is still granulating in there are areas of epithelialization occurring.  Assessment: Well-healing surgical toe.  Plan: Follow-up with me on an as-needed basis if this does not going to heal he was instructed to continue to soak every other day until completely resolved.

## 2019-04-27 ENCOUNTER — Other Ambulatory Visit: Payer: Self-pay

## 2019-04-27 ENCOUNTER — Telehealth: Payer: Self-pay | Admitting: Family Medicine

## 2019-04-27 ENCOUNTER — Ambulatory Visit (INDEPENDENT_AMBULATORY_CARE_PROVIDER_SITE_OTHER): Payer: Medicare Other | Admitting: Family Medicine

## 2019-04-27 ENCOUNTER — Encounter: Payer: Self-pay | Admitting: Family Medicine

## 2019-04-27 DIAGNOSIS — Z1159 Encounter for screening for other viral diseases: Secondary | ICD-10-CM

## 2019-04-27 DIAGNOSIS — I10 Essential (primary) hypertension: Secondary | ICD-10-CM

## 2019-04-27 DIAGNOSIS — G8929 Other chronic pain: Secondary | ICD-10-CM | POA: Diagnosis not present

## 2019-04-27 DIAGNOSIS — M545 Low back pain, unspecified: Secondary | ICD-10-CM

## 2019-04-27 DIAGNOSIS — E785 Hyperlipidemia, unspecified: Secondary | ICD-10-CM | POA: Diagnosis not present

## 2019-04-27 MED ORDER — ROSUVASTATIN CALCIUM 40 MG PO TABS: 40.0000 mg | ORAL_TABLET | Freq: Every day | ORAL | 3 refills | Status: DC

## 2019-04-27 MED ORDER — LISINOPRIL-HYDROCHLOROTHIAZIDE 20-25 MG PO TABS: 1.0000 | ORAL_TABLET | Freq: Every day | ORAL | 3 refills | Status: DC

## 2019-04-27 MED ORDER — PNEUMOCOCCAL VAC POLYVALENT 25 MCG/0.5ML IJ INJ: 0.5000 mL | INJECTION | Freq: Once | INTRAMUSCULAR | 0 refills | Status: AC

## 2019-04-27 NOTE — Telephone Encounter (Signed)
Called and spoke with pt. Pt has been scheduled.

## 2019-04-27 NOTE — Assessment & Plan Note (Signed)
Chronic issue.  This is stable.  He will continue to monitor.

## 2019-04-27 NOTE — Assessment & Plan Note (Signed)
Continue Crestor.  Check lab work.

## 2019-04-27 NOTE — Progress Notes (Signed)
Virtual Visit via video Note  This visit type was conducted due to national recommendations for restrictions regarding the COVID-19 pandemic (e.g. social distancing).  This format is felt to be most appropriate for this patient at this time.  All issues noted in this document were discussed and addressed.  No physical exam was performed (except for noted visual exam findings with Video Visits).   I connected with Ronald Hunter today at  8:00 AM EDT by a video enabled telemedicine application and verified that I am speaking with the correct person using two identifiers. Location patient: home Location provider: work Persons participating in the virtual visit: patient, provider, Bartosz Luginbill  I discussed the limitations, risks, security and privacy concerns of performing an evaluation and management service by telephone and the availability of in person appointments. I also discussed with the patient that there may be a patient responsible charge related to this service. The patient expressed understanding and agreed to proceed.   Reason for visit: follow-up  HPI: HYPERTENSION  Disease Monitoring  Home BP Monitoring not checking Chest pain- no    Dyspnea- no Medications  Compliance-  Taking lisinopril, hctz.  Edema- no He has been walking daily 1-2 times.  His diet is good per his report.  HYPERLIPIDEMIA Symptoms Chest pain on exertion:  no      Claudication: no Medications: Compliance- taking crestor Right upper quadrant pain- no  Muscle aches- no  Chronic back pain: stable. No worse than usual.  He notes no numbness or weakness.  He has had dropfoot since his last back surgery.  This is unchanged.  He notes walking helps with this.  ROS: See pertinent positives and negatives per HPI.  Past Medical History:  Diagnosis Date  . Anxiety   . Arthritis   . Chronic back pain    stenosis/spondylosis  . History of bronchitis    as a child  . History of colon polyps   . History of  kidney stones   . Hyperlipidemia    takes Crestor daily  . Hypertension    takes Metoprolol daily  . PONV (postoperative nausea and vomiting)    Nausea with one surgery    Past Surgical History:  Procedure Laterality Date  . COLONOSCOPY    . COLONOSCOPY     several  . COLONOSCOPY W/ POLYPECTOMY    . COLONOSCOPY WITH PROPOFOL N/A 08/26/2018   Procedure: COLONOSCOPY WITH PROPOFOL;  Surgeon: Lucilla Lame, MD;  [facility];  Service: Endoscopy;  Laterality: N/A;  . cyst removed from under left arm    . KNEE ARTHROSCOPY     right   . KNEE ARTHROSCOPY W/ ACL RECONSTRUCTION    . LUMBAR LAMINECTOMY/DECOMPRESSION MICRODISCECTOMY  08/11/2012   Procedure: LUMBAR LAMINECTOMY/DECOMPRESSION MICRODISCECTOMY 1 LEVEL;  Surgeon: Hosie Spangle, MD;  Location: [place] [facility];  Service: Neurosurgery;  Laterality: Right;  RIGHT L45 laminotomy and microdiskectomy  . LUMBAR LAMINECTOMY/DECOMPRESSION MICRODISCECTOMY Right 01/29/2015   Procedure: LUMBAR LAMINECTOMY/DECOMPRESSION MICRODISCECTOMY 1 LEVEL;  Surgeon: Hosie Spangle, MD;  Location: [place] [facility];  Service: Neurosurgery;  Laterality: Right;  Right L45 Laminotomy and microdisketomy  . SHOULDER SURGERY     right   . TONSILLECTOMY      Family History  Problem Relation Age of Onset  . Hyperlipidemia Mother   . Heart disease Mother   . Congestive Heart Failure Mother   . Colon cancer Father     SOCIAL HX: Non-smoker.   Current Outpatient Medications:  .  aspirin 81 MG tablet, Take 81 mg by mouth daily., Disp: , Rfl:  .  lisinopril-hydrochlorothiazide (ZESTORETIC) 20-25 MG tablet, Take 1 tablet by mouth daily., Disp: 90 tablet, Rfl: 3 .  Multiple Vitamin (MULTIVITAMIN WITH MINERALS) TABS, Take 1 tablet by mouth daily., Disp: , Rfl:  .  rosuvastatin (CRESTOR) 40 MG tablet, Take 1 tablet (40 mg total) by mouth daily., Disp: 90 tablet, Rfl: 3 .  pneumococcal 23 valent vaccine (PNU-IMMUNE) 25 MCG/0.5ML injection, Inject 0.5 mLs  into the muscle once for 1 dose., Disp: 0.5 mL, Rfl: 0  EXAM:  VITALS per patient if applicable: None.  GENERAL: alert, oriented, appears well and in no acute distress  HEENT: atraumatic, conjunttiva clear, no obvious abnormalities on inspection of external nose and ears  NECK: normal movements of the head and neck  LUNGS: on inspection no signs of respiratory distress, breathing rate appears normal, no obvious gross SOB, gasping or wheezing  CV: no obvious cyanosis  MS: moves all visible extremities without noticeable abnormality  PSYCH/NEURO: pleasant and cooperative, no obvious depression or anxiety, speech and thought processing grossly intact  ASSESSMENT AND PLAN:  Discussed the following assessment and plan:  Essential hypertension - Plan: Comp Met (CMET)  Hyperlipidemia, unspecified hyperlipidemia type - Plan: Lipid panel  Need for hepatitis C screening test - Plan: Hepatitis C Antibody  Chronic low back pain without sciatica, unspecified back pain laterality  Hypertension He will check 2-3 times over the next week and contact us with his readings.  He will continue his current medications.  He will come in for lab work.  Chronic low back pain Chronic issue.  This is stable.  He will continue to monitor.  Hyperlipidemia Continue Crestor.  Check lab work.  Health maintenance: Patient will get his Pneumovax at the pharmacy.  He will have hepatitis C screening with lab work.  CMA will contact patient to schedule follow-up for lab work in 1 month and follow-up in the office in 6 months.  Social distancing precautions and sick precautions given regarding COVID-19.   I discussed the assessment and treatment plan with the patient. The patient was provided an opportunity to ask questions and all were answered. The patient agreed with the plan and demonstrated an understanding of the instructions.   The patient was advised to call back or seek an in-person evaluation if  the symptoms worsen or if the condition fails to improve as anticipated.   [name], MD

## 2019-04-27 NOTE — Telephone Encounter (Signed)
Please contact the patient and get him set up for lab work in 1 month.  Please get him set up for follow-up in 6 months.  Thanks.

## 2019-04-27 NOTE — Assessment & Plan Note (Addendum)
He will check 2-3 times over the next week and contact us with his readings.  He will continue his current medications.  He will come in for lab work.

## 2019-05-20 DIAGNOSIS — Z9889 Other specified postprocedural states: Secondary | ICD-10-CM | POA: Diagnosis not present

## 2019-05-20 DIAGNOSIS — M5416 Radiculopathy, lumbar region: Secondary | ICD-10-CM | POA: Diagnosis not present

## 2019-05-20 DIAGNOSIS — M4726 Other spondylosis with radiculopathy, lumbar region: Secondary | ICD-10-CM | POA: Diagnosis not present

## 2019-05-20 DIAGNOSIS — M21371 Foot drop, right foot: Secondary | ICD-10-CM | POA: Diagnosis not present

## 2019-05-20 DIAGNOSIS — M5136 Other intervertebral disc degeneration, lumbar region: Secondary | ICD-10-CM | POA: Diagnosis not present

## 2019-06-01 ENCOUNTER — Other Ambulatory Visit: Payer: Self-pay

## 2019-06-01 ENCOUNTER — Other Ambulatory Visit (INDEPENDENT_AMBULATORY_CARE_PROVIDER_SITE_OTHER): Payer: Medicare Other

## 2019-06-01 DIAGNOSIS — I10 Essential (primary) hypertension: Secondary | ICD-10-CM | POA: Diagnosis not present

## 2019-06-01 DIAGNOSIS — E785 Hyperlipidemia, unspecified: Secondary | ICD-10-CM

## 2019-06-01 DIAGNOSIS — Z1159 Encounter for screening for other viral diseases: Secondary | ICD-10-CM

## 2019-06-02 LAB — COMPREHENSIVE METABOLIC PANEL
AG Ratio: 2 (calc) (ref 1.0–2.5)
ALT: 31 U/L (ref 9–46)
AST: 28 U/L (ref 10–35)
Albumin: 4.4 g/dL (ref 3.6–5.1)
Alkaline phosphatase (APISO): 65 U/L (ref 35–144)
BUN/Creatinine Ratio: 21 (calc) (ref 6–22)
BUN: 14 mg/dL (ref 7–25)
CO2: 24 mmol/L (ref 20–32)
Calcium: 9.1 mg/dL (ref 8.6–10.3)
Chloride: 104 mmol/L (ref 98–110)
Creat: 0.68 mg/dL — ABNORMAL LOW (ref 0.70–1.25)
Globulin: 2.2 g/dL (calc) (ref 1.9–3.7)
Glucose, Bld: 100 mg/dL — ABNORMAL HIGH (ref 65–99)
Potassium: 4 mmol/L (ref 3.5–5.3)
Sodium: 139 mmol/L (ref 135–146)
Total Bilirubin: 0.5 mg/dL (ref 0.2–1.2)
Total Protein: 6.6 g/dL (ref 6.1–8.1)

## 2019-06-02 LAB — HEPATITIS C ANTIBODY
Hepatitis C Ab: NONREACTIVE
SIGNAL TO CUT-OFF: 0.01 (ref <1.00)

## 2019-06-02 LAB — LIPID PANEL
Cholesterol: 199 mg/dL (ref <200)
HDL: 52 mg/dL (ref >=40)
LDL Cholesterol (Calc): 124 mg/dL (calc) — ABNORMAL HIGH
Non-HDL Cholesterol (Calc): 147 mg/dL (calc) — ABNORMAL HIGH (ref <130)
Total CHOL/HDL Ratio: 3.8 (calc) (ref <5.0)
Triglycerides: 118 mg/dL (ref <150)

## 2019-06-09 DIAGNOSIS — M21371 Foot drop, right foot: Secondary | ICD-10-CM | POA: Diagnosis not present

## 2019-06-09 DIAGNOSIS — Z6831 Body mass index (BMI) 31.0-31.9, adult: Secondary | ICD-10-CM | POA: Diagnosis not present

## 2019-06-09 DIAGNOSIS — M5416 Radiculopathy, lumbar region: Secondary | ICD-10-CM | POA: Diagnosis not present

## 2019-06-09 DIAGNOSIS — M4726 Other spondylosis with radiculopathy, lumbar region: Secondary | ICD-10-CM | POA: Diagnosis not present

## 2019-06-09 DIAGNOSIS — M5136 Other intervertebral disc degeneration, lumbar region: Secondary | ICD-10-CM | POA: Diagnosis not present

## 2019-06-09 DIAGNOSIS — I10 Essential (primary) hypertension: Secondary | ICD-10-CM | POA: Diagnosis not present

## 2019-06-09 DIAGNOSIS — Z9889 Other specified postprocedural states: Secondary | ICD-10-CM | POA: Diagnosis not present

## 2019-06-09 DIAGNOSIS — M5126 Other intervertebral disc displacement, lumbar region: Secondary | ICD-10-CM | POA: Diagnosis not present

## 2019-07-08 ENCOUNTER — Ambulatory Visit (INDEPENDENT_AMBULATORY_CARE_PROVIDER_SITE_OTHER): Payer: Medicare Other

## 2019-07-08 DIAGNOSIS — Z0000 Encounter for general adult medical examination without abnormal findings: Secondary | ICD-10-CM | POA: Diagnosis not present

## 2019-07-08 NOTE — Progress Notes (Signed)
Subjective:   KHIRY PASQUARIELLO is a 68 y.o. male who presents for an Initial Medicare Annual Wellness Visit.  Review of Systems   Cardiac Risk Factors include: advanced age (>59men, >62 women);male gender;hypertension No ROS.  Medicare Wellness Virtual Visit.  Visual/audio telehealth visit, UTA vital signs.   See social history for additional risk factors.      Objective:    Today's Vitals   There is no height or weight on file to calculate BMI.  Advanced Directives 07/08/2019 08/26/2018 01/28/2015 08/08/2012  Does Patient Have a Medical Advance Directive? No No No Patient does not have advance directive;Patient would not like information  Would patient like information on creating a medical advance directive? No - Patient declined - No - patient declined information -  Pre-existing out of facility DNR order (yellow form or pink MOST form) - - - No    Current Medications (verified) Outpatient Encounter Medications as of 07/08/2019  Medication Sig  . aspirin 81 MG tablet Take 81 mg by mouth daily.  Marland Kitchen lisinopril-hydrochlorothiazide (ZESTORETIC) 20-25 MG tablet Take 1 tablet by mouth daily.  . Multiple Vitamin (MULTIVITAMIN WITH MINERALS) TABS Take 1 tablet by mouth daily.  . rosuvastatin (CRESTOR) 40 MG tablet Take 1 tablet (40 mg total) by mouth daily.   No facility-administered encounter medications on file as of 07/08/2019.     Allergies (verified) Patient has no known allergies.   History: Past Medical History:  Diagnosis Date  . Anxiety   . Arthritis   . Chronic back pain    stenosis/spondylosis  . History of bronchitis    as a child  . History of colon polyps   . History of kidney stones   . Hyperlipidemia    takes Crestor daily  . Hypertension    takes Metoprolol daily  . PONV (postoperative nausea and vomiting)    Nausea with one surgery   Past Surgical History:  Procedure Laterality Date  . COLONOSCOPY    . COLONOSCOPY     several  . COLONOSCOPY W/  POLYPECTOMY    . COLONOSCOPY WITH PROPOFOL N/A 08/26/2018   Procedure: COLONOSCOPY WITH PROPOFOL;  Surgeon: Lucilla Lame, MD;  Location: [facility] ENDOSCOPY;  Service: Endoscopy;  Laterality: N/A;  . cyst removed from under left arm    . KNEE ARTHROSCOPY     right   . KNEE ARTHROSCOPY W/ ACL RECONSTRUCTION    . LUMBAR LAMINECTOMY/DECOMPRESSION MICRODISCECTOMY  08/11/2012   Procedure: LUMBAR LAMINECTOMY/DECOMPRESSION MICRODISCECTOMY 1 LEVEL;  Surgeon: Hosie Spangle, MD;  Location: [facility];  Service: Neurosurgery;  Laterality: Right;  RIGHT L45 laminotomy and microdiskectomy  . LUMBAR LAMINECTOMY/DECOMPRESSION MICRODISCECTOMY Right 01/29/2015   Procedure: LUMBAR LAMINECTOMY/DECOMPRESSION MICRODISCECTOMY 1 LEVEL;  Surgeon: Hosie Spangle, MD;  Location: [facility];  Service: Neurosurgery;  Laterality: Right;  Right L45 Laminotomy and microdisketomy  . SHOULDER SURGERY     right   . TONSILLECTOMY     Family History  Problem Relation Age of Onset  . Hyperlipidemia Mother   . Heart disease Mother   . Congestive Heart Failure Mother   . Colon cancer Father    Social History   Socioeconomic History  . Marital status: Married    Spouse name: Not on file  . Number of children: Not on file  . Years of education: Not on file  . Highest education level: Not on file  Occupational History  . Not on file  Social Needs  . Financial resource strain: Not  hard at all  . Food insecurity    Worry: Never true    Inability: Never true  . Transportation needs    Medical: No    Non-medical: No  Tobacco Use  . Smoking status: Never Smoker  . Smokeless tobacco: Never Used  Substance and Sexual Activity  . Alcohol use: Yes    Alcohol/week: 16.0 standard drinks    Types: 14 Glasses of wine, 2 Shots of liquor per week  . Drug use: No  . Sexual activity: Yes  Lifestyle  . Physical activity    Days per week: Not on file    Minutes per session: Not on file  . Stress: Not at all   Relationships  . Social Herbalist on phone: Not on file    Gets together: Not on file    Attends religious service: Not on file    Active member of club or organization: Not on file    Attends meetings of clubs or organizations: Not on file    Relationship status: Not on file  Other Topics Concern  . Not on file  Social History Narrative  . Not on file   Tobacco Counseling Counseling given: Not Answered   Clinical Intake:  Pre-visit preparation completed: Yes        Diabetes: No  How often do you need to have someone help you when you read instructions, pamphlets, or other written materials from your doctor or pharmacy?: 1 - Never  Interpreter Needed?: No     Activities of Daily Living In your present state of health, do you have any difficulty performing the following activities: 07/08/2019  Hearing? N  Vision? N  Difficulty concentrating or making decisions? N  Walking or climbing stairs? N  Comment Paces self when walking  Dressing or bathing? N  Doing errands, shopping? N  Preparing Food and eating ? N  Using the Toilet? N  In the past six months, have you accidently leaked urine? N  Do you have problems with loss of bowel control? N  Managing your Medications? N  Managing your Finances? N  Housekeeping or managing your Housekeeping? N  Some recent data might be hidden     Immunizations and Health Maintenance Immunization History  Administered Date(s) Administered  . Influenza, High Dose Seasonal PF 10/27/2018  . Influenza,inj,Quad PF,6+ Mos 10/23/2017  . Pneumococcal Conjugate-13 04/24/2018  . Tdap 04/24/2018   Health Maintenance Due  Topic Date Due  . PNA vac Low Risk Adult (2 of 2 - PPSV23) 04/25/2019    Patient Care Team: Leone Haven, MD as PCP - General (Family Medicine)  Indicate any recent Medical Services you may have received from other than [facility] providers in the past year (date may be approximate).    Assessment:    This is a routine wellness examination for Ravindra.  I connected with patient 07/08/19 at  8:30 AM EDT by a video/audio enabled telemedicine application and verified that I am speaking with the correct person using two identifiers. Patient stated full name and DOB. Patient gave permission to continue with virtual visit. Patient's location was at home and Nurse's location was at [place] office.   Patient reports blood pressure taken once in May 138/78 and June 140/80. He plans to continue to spot check and keep all routine maintenance appointments. Next scheduled appointment 10/2019.   Health Screenings  Colonoscopy - 08/2018 Glaucoma -none Hearing -demonstrates normal hearing during visit. Hemoglobin A1C - 04/2018 (5.6) Cholesterol -  05/2019 Dental- UTD Vision- visits within the last 12 months. Hepatitis C Screening- 05/2019  Social  Alcohol intake - yes      Smoking history- never  Smokers in home? none Illicit drug use? none Exercise - walking daily 1.5-2 miles, yard work Diet - regular Sexually Active -yes BMI- discussed the importance of a healthy diet, water intake and the benefits of aerobic exercise.  Educational material provided.   Safety  Patient feels safe at home- yes Patient does have smoke detectors at home- yes Patient does wear sunscreen or protective clothing when in direct sunlight -yes Patient does wear seat belt when in a moving vehicle -yes Patient drives- yes  [id number] precautions and sickness symptoms discussed.   Activities of Daily Living Patient denies needing assistance with: driving, household chores, feeding themselves, getting from bed to chair, getting to the toilet, bathing/showering, dressing, managing money, or preparing meals.  No new identified risk were noted.    Depression Screen Patient denies losing interest in daily life, feeling hopeless, or crying easily over simple problems.   Medication-taking as directed and without issues.   Fall  Screen Patient denies being afraid of falling or falling in the last year.   Memory Screen Patient is alert.  Patient denies difficulty focusing, concentrating or misplacing items. Correctly identified the president of the [place], season and recall. Patient likes to read articles online for brain stimulation.  Immunizations The following Immunizations were discussed: Influenza, shingles, pneumonia, and tetanus.   Other Providers Patient Care Team: Leone Haven, MD as PCP - General (Family Medicine)  Hearing/Vision screen  Hearing Screening   125Hz  250Hz  500Hz  1000Hz  2000Hz  3000Hz  4000Hz  6000Hz  8000Hz   Right ear:           Left ear:           Comments: Patient is able to hear conversational tones without difficulty.  No issues reported.  Vision Screening Comments: Visual acuity not assessed, virtual visit.  They have seen their ophthalmologist in the last 12 months.     Dietary issues and exercise activities discussed: Current Exercise Habits: Home exercise routine, Type of exercise: stretching;walking, Frequency (Times/Week): 7, Intensity: Mild  Goals      Patient Stated   . Follow up with Primary Care Provider (pt-stated)     Maintain Healthy Lifestyle Keep all routine maintenance appointments      Depression Screen PHQ 2/9 Scores 07/08/2019 07/29/2017  PHQ - 2 Score 0 0    Fall Risk Fall Risk  07/08/2019 07/29/2017  Falls in the past year? 0 No   Is the patient's home free of loose throw rugs in walkways, pet beds, electrical cords, etc? yes      Grab bars in the bathroom? yes      Handrails on the stairs? yes      Adequate lighting?  yes  Cognitive Function:        Screening Tests Health Maintenance  Topic Date Due  . PNA vac Low Risk Adult (2 of 2 - PPSV23) 04/25/2019  . INFLUENZA VACCINE  07/18/2019  . COLONOSCOPY  08/27/2023  . TETANUS/TDAP  04/24/2028  . Hepatitis C Screening  Completed      Plan:   End of life planning; Advanced aging;  Advanced directives discussed.  No HCPOA/Living Will.  Additional information declined at this time.  I have personally reviewed and noted the following in the patient's chart:   . Medical and social history . Use of alcohol, tobacco or illicit  drugs  . Current medications and supplements . Functional ability and status . Nutritional status . Physical activity . Advanced directives . List of other physicians . Hospitalizations, surgeries, and ER visits in previous 12 months . Vitals . Screenings to include cognitive, depression, and falls . Referrals and appointments  In addition, I have reviewed and discussed with patient certain preventive protocols, quality metrics, and best practice recommendations. A written personalized care plan for preventive services as well as general preventive health recommendations were provided to patient.     Varney Biles, LPN   1/69/6789

## 2019-07-08 NOTE — Patient Instructions (Addendum)
  [name] , Thank you for taking time to come for your Medicare Wellness Visit. I appreciate your ongoing commitment to your health goals. Please review the following plan we discussed and let me know if I can assist you in the future.   These are the goals we discussed: Goals      Patient Stated   . Follow up with Primary Care Provider (pt-stated)     Maintain Healthy Lifestyle Keep all routine maintenance appointments       This is a list of the screening recommended for you and due dates:  Health Maintenance  Topic Date Due  . Pneumonia vaccines (2 of 2 - PPSV23) 11/16/2019*  . Flu Shot  07/18/2019  . Colon Cancer Screening  08/27/2023  . Tetanus Vaccine  04/24/2028  .  Hepatitis C: One time screening is recommended by Center for Disease Control  (CDC) for  adults born from [birth year] through 1963.   Completed  *Topic was postponed. The date shown is not the original due date.

## 2019-07-14 DIAGNOSIS — Z23 Encounter for immunization: Secondary | ICD-10-CM | POA: Diagnosis not present

## 2019-09-28 DIAGNOSIS — E669 Obesity, unspecified: Secondary | ICD-10-CM | POA: Diagnosis not present

## 2019-09-28 DIAGNOSIS — Z125 Encounter for screening for malignant neoplasm of prostate: Secondary | ICD-10-CM | POA: Diagnosis not present

## 2019-09-28 DIAGNOSIS — R102 Pelvic and perineal pain: Secondary | ICD-10-CM | POA: Diagnosis not present

## 2019-09-28 DIAGNOSIS — N500 Atrophy of testis: Secondary | ICD-10-CM | POA: Diagnosis not present

## 2019-09-28 DIAGNOSIS — N401 Enlarged prostate with lower urinary tract symptoms: Secondary | ICD-10-CM | POA: Diagnosis not present

## 2019-10-07 DIAGNOSIS — Z23 Encounter for immunization: Secondary | ICD-10-CM | POA: Diagnosis not present

## 2019-10-15 ENCOUNTER — Other Ambulatory Visit: Payer: Self-pay

## 2019-10-19 ENCOUNTER — Ambulatory Visit (INDEPENDENT_AMBULATORY_CARE_PROVIDER_SITE_OTHER): Payer: Medicare Other | Admitting: Family Medicine

## 2019-10-19 ENCOUNTER — Encounter: Payer: Self-pay | Admitting: Family Medicine

## 2019-10-19 ENCOUNTER — Other Ambulatory Visit: Payer: Self-pay

## 2019-10-19 VITALS — BP 130/80 | HR 72 | Temp 97.6°F | Ht 72.0 in | Wt 239.4 lb

## 2019-10-19 DIAGNOSIS — E785 Hyperlipidemia, unspecified: Secondary | ICD-10-CM | POA: Diagnosis not present

## 2019-10-19 DIAGNOSIS — E669 Obesity, unspecified: Secondary | ICD-10-CM | POA: Diagnosis not present

## 2019-10-19 DIAGNOSIS — R635 Abnormal weight gain: Secondary | ICD-10-CM | POA: Diagnosis not present

## 2019-10-19 DIAGNOSIS — Z125 Encounter for screening for malignant neoplasm of prostate: Secondary | ICD-10-CM | POA: Diagnosis not present

## 2019-10-19 DIAGNOSIS — M7661 Achilles tendinitis, right leg: Secondary | ICD-10-CM | POA: Diagnosis not present

## 2019-10-19 DIAGNOSIS — I10 Essential (primary) hypertension: Secondary | ICD-10-CM | POA: Diagnosis not present

## 2019-10-19 DIAGNOSIS — E66811 Obesity, class 1: Secondary | ICD-10-CM

## 2019-10-19 NOTE — Assessment & Plan Note (Signed)
Suspect Achilles tendinitis.  Discussed continuing ice and TENS unit.  Advised on stretches.  Discussed that he could progress from the stretches to the strengthening exercises once his pain starts to improve.  He can trial scheduled ibuprofen for the next 3 to 4 days.  Discussed taking this with food.  If not improving over the next 1 to 2 weeks he will contact us.

## 2019-10-19 NOTE — Assessment & Plan Note (Signed)
Patient saw urology.  They ordered a PSA.  This needs to go to [facility] and thus this was ordered here.  He signed the release for this to be billed through Medicare.

## 2019-10-19 NOTE — Progress Notes (Signed)
[name], MD Phone: [phone]  Ronald Hunter is a 68 y.o. male who presents today for follow-up.  Hypertension: He reports the readings are similar to today.  Taking lisinopril and HCTZ.  No chest pain, shortness of breath, or edema.  Hyperlipidemia: Taking Crestor.  No right upper quadrant pain or myalgias.  Prostate cancer screening: Patient saw a urologist for this.  They could not draw his labs there as they needed to go to [place].  He reports he has had no urinary or prostate issues.  No family history of prostate cancer.  The patient reports his exam was normal.  They also ordered a testosterone level given his weight gain.  Right Achilles tendinitis: Patient notes this has been going on for several weeks.  He felt as though he had turned a corner though he did some walking and exacerbated his symptoms.  He has been using ice and a TENS unit with some benefit.  No specific injury.  He does have a history of this in the past.  He has occasionally been taking ibuprofen.  Social History   Tobacco Use  Smoking Status Never Smoker  Smokeless Tobacco Never Used     ROS see history of present illness  Objective  Physical Exam Vitals:   10/19/19 0811  BP: 130/80  Pulse: 72  Temp: 97.6 F (36.4 C)  SpO2: 99%    BP Readings from Last 3 Encounters:  10/19/19 130/80  10/27/18 140/70  08/26/18 (!) 150/86   Wt Readings from Last 3 Encounters:  10/19/19 239 lb 6.4 oz (108.6 kg)  10/27/18 229 lb (103.9 kg)  08/26/18 225 lb (102.1 kg)    Physical Exam Constitutional:      General: He is not in acute distress.    Appearance: He is not diaphoretic.  Cardiovascular:     Rate and Rhythm: Normal rate and regular rhythm.     Heart sounds: Normal heart sounds.  Pulmonary:     Effort: Pulmonary effort is normal.     Breath sounds: Normal breath sounds.  Musculoskeletal:     Right lower leg: No edema.     Left lower leg: No edema.     Comments: Right Achilles with  slight tenderness about 2 cm proximal to the insertion site, no palpable defects, no apparent disruption of the Achilles tendon, no other tenderness of the right ankle, left Achilles with no tenderness  Skin:    General: Skin is warm and dry.  Neurological:     Mental Status: He is alert.      Assessment/Plan: Please see individual problem list.  Hypertension Adequately controlled.  Check BMP.  Continue current medication.  Hyperlipidemia He will continue Crestor.  Prostate cancer screening Patient saw urology.  They ordered a PSA.  This needs to go to [facility] and thus this was ordered here.  He signed the release for this to be billed through Medicare.  Obesity (BMI 30.0-34.9) Urology wanted a testosterone ordered due to the patient's weight gain.  This was ordered.  Right Achilles tendinitis Suspect Achilles tendinitis.  Discussed continuing ice and TENS unit.  Advised on stretches.  Discussed that he could progress from the stretches to the strengthening exercises once his pain starts to improve.  He can trial scheduled ibuprofen for the next 3 to 4 days.  Discussed taking this with food.  If not improving over the next 1 to 2 weeks he will contact us.    Orders Placed This Encounter  Procedures  .  PSA  . Basic Metabolic Panel (BMET)  . Testosterone    No orders of the defined types were placed in this encounter.    [name], MD [place]

## 2019-10-19 NOTE — Assessment & Plan Note (Signed)
He will continue Crestor. 

## 2019-10-19 NOTE — Patient Instructions (Signed)
Nice to see you. We will get labs today.  Please continue to ice your achilles 2-3 times daily for about 10 minutes at a time.  Please do the stretches. Start with the stretching and range of motion exercises. If the pain is improving then you can move on to the strengthening exercises.  You can take ibuprofen 600 mg by mouth every 8 hours for the next 3-4 days. Please take this with food.  If your achilles is not improving please let us know.    Achilles Tendinitis Rehab Ask your health care provider which exercises are safe for you. Do exercises exactly as told by your health care provider and adjust them as directed. It is normal to feel mild stretching, pulling, tightness, or discomfort as you do these exercises. Stop right away if you feel sudden pain or your pain gets worse. Do not begin these exercises until told by your health care provider. Stretching and range-of-motion exercises These exercises warm up your muscles and joints and improve the movement and flexibility of your ankle. These exercises also help to relieve pain. Standing wall calf stretch with straight knee  1. Stand with your hands against a wall. 2. Extend your left / right leg behind you, and bend your front knee slightly. Keep both of your heels on the floor. 3. Point the toes of your back foot slightly inward. 4. Keeping your heels on the floor and your back knee straight, shift your weight toward the wall. Do not allow your back to arch. You should feel a gentle stretch in your upper calf. 5. Hold this position for __________ seconds. Repeat __________ times. Complete this exercise __________ times a day. Standing wall calf stretch with bent knee 1. Stand with your hands against a wall. 2. Extend your left / right leg behind you, and bend your front knee slightly. Keep both of your heels on the floor. 3. Point the toes of your back foot slightly inward. 4. Keeping your heels on the floor, bend your back knee  slightly. You should feel a gentle stretch deep in your lower calf near your heel. 5. Hold this position for __________ seconds. Repeat __________ times. Complete this exercise __________ times a day. Strengthening exercises These exercises build strength and control of your ankle. Endurance is the ability to use your muscles for a long time, even after they get tired. Plantar flexion with band In this exercise, you push your toes downward, away from you, with an exercise band providing resistance. 1. Sit on the floor with your left / right leg extended. You may put a pillow under your calf to give your foot more room to move. 2. Loop a rubber exercise band or tube around the ball of your left / right foot. The ball of your foot is on the walking surface, right under your toes. The band or tube should be slightly tense when your foot is relaxed. If the band or tube slips, you can put on your shoe or put a washcloth between the band and your foot to help it stay in place. 3. Slowly point your toes downward, pushing them away from you (plantar flexion). 4. Hold this position for __________ seconds. 5. Slowly release the tension in the band or tube, controlling smoothly until your foot is back to the starting position. 6. Repeat steps 1-5 with your left / right leg. Repeat __________ times. Complete this exercise __________ times a day. Eccentric heel drop  In this exercise, you stand  and slowly raise your heel and then slowly lower it. This exercise lengthens the calf muscles (eccentric) while the heel bears weight. If this exercise is too easy, try doing it while wearing a backpack with weights in it. 1. Stand on a step with the balls of your feet. The ball of your foot is on the walking surface, right under your toes. ? Do not put your heels on the step. ? For balance, rest your hands on the wall or on a railing. 2. Rise up onto the balls of your feet. 3. Keeping your heels up, shift all of  your weight to your left / right leg and pick up your other leg. 4. Slowly lower your left / right leg so your heel drops below the level of the step. 5. Put down your other foot before returning to the start position. If told by your health care provider, build up to: ? 3 sets of 15 repetitions while keeping your knees straight. ? 3 sets of 15 repetitions while keeping your knees slightly bent as far as told by your health care provider. Repeat __________ times. Complete this exercise __________ times a day. Balance exercises These exercises improve or maintain your balance. Balance is important in preventing falls. Single leg stand If this exercise is too easy, you can try it with your eyes closed or while standing on a pillow. 1. Without shoes, stand near a railing or in a door frame. Hold on to the railing or door frame as needed. 2. Stand on your left / right foot. Keep your big toe down on the floor and try to keep your arch lifted. 3. Hold this position for __________ seconds. Repeat __________ times. Complete this exercise __________ times a day. This information is not intended to replace advice given to you by your health care provider. Make sure you discuss any questions you have with your health care provider. Document Released: 07/04/2005 Document Revised: 03/23/2019 Document Reviewed: 09/15/2018 Elsevier Patient Education  2020 [employer].

## 2019-10-19 NOTE — Assessment & Plan Note (Signed)
Adequately controlled.  Check BMP.  Continue current medication.

## 2019-10-19 NOTE — Assessment & Plan Note (Signed)
Urology wanted a testosterone ordered due to the patient's weight gain.  This was ordered.

## 2019-10-20 ENCOUNTER — Other Ambulatory Visit: Payer: Self-pay | Admitting: Family Medicine

## 2019-10-20 DIAGNOSIS — E870 Hyperosmolality and hypernatremia: Secondary | ICD-10-CM

## 2019-10-20 LAB — BASIC METABOLIC PANEL
BUN: 12 mg/dL (ref 7–25)
CO2: 20 mmol/L (ref 20–32)
Calcium: 10 mg/dL (ref 8.6–10.3)
Chloride: 110 mmol/L (ref 98–110)
Creat: 0.84 mg/dL (ref 0.70–1.25)
Glucose, Bld: 97 mg/dL (ref 65–99)
Potassium: 4.6 mmol/L (ref 3.5–5.3)
Sodium: 149 mmol/L — ABNORMAL HIGH (ref 135–146)

## 2019-10-20 LAB — PSA: PSA: 2.6 ng/mL (ref <=4.0)

## 2019-10-20 LAB — TESTOSTERONE: Testosterone: 437 ng/dL (ref 250–827)

## 2019-10-29 ENCOUNTER — Other Ambulatory Visit: Payer: Self-pay

## 2019-11-02 ENCOUNTER — Ambulatory Visit: Payer: Medicare Other | Admitting: Family Medicine

## 2019-11-02 ENCOUNTER — Other Ambulatory Visit (INDEPENDENT_AMBULATORY_CARE_PROVIDER_SITE_OTHER): Payer: Medicare Other

## 2019-11-02 ENCOUNTER — Other Ambulatory Visit: Payer: Self-pay

## 2019-11-02 DIAGNOSIS — E870 Hyperosmolality and hypernatremia: Secondary | ICD-10-CM

## 2019-11-03 LAB — BASIC METABOLIC PANEL
BUN: 14 mg/dL (ref 7–25)
CO2: 26 mmol/L (ref 20–32)
Calcium: 9.2 mg/dL (ref 8.6–10.3)
Chloride: 103 mmol/L (ref 98–110)
Creat: 0.72 mg/dL (ref 0.70–1.25)
Glucose, Bld: 92 mg/dL (ref 65–99)
Potassium: 4.4 mmol/L (ref 3.5–5.3)
Sodium: 141 mmol/L (ref 135–146)

## 2020-04-15 ENCOUNTER — Other Ambulatory Visit: Payer: Self-pay

## 2020-04-18 ENCOUNTER — Encounter: Payer: Self-pay | Admitting: Family Medicine

## 2020-04-18 ENCOUNTER — Other Ambulatory Visit: Payer: Self-pay

## 2020-04-18 ENCOUNTER — Ambulatory Visit (INDEPENDENT_AMBULATORY_CARE_PROVIDER_SITE_OTHER): Payer: Medicare Other | Admitting: Family Medicine

## 2020-04-18 VITALS — BP 150/80 | HR 72 | Temp 97.5°F | Ht 72.0 in | Wt 238.6 lb

## 2020-04-18 DIAGNOSIS — M545 Low back pain, unspecified: Secondary | ICD-10-CM

## 2020-04-18 DIAGNOSIS — I10 Essential (primary) hypertension: Secondary | ICD-10-CM | POA: Diagnosis not present

## 2020-04-18 DIAGNOSIS — E785 Hyperlipidemia, unspecified: Secondary | ICD-10-CM | POA: Diagnosis not present

## 2020-04-18 DIAGNOSIS — G8929 Other chronic pain: Secondary | ICD-10-CM

## 2020-04-18 NOTE — Assessment & Plan Note (Signed)
Check LDL.  Continue Crestor.

## 2020-04-18 NOTE — Assessment & Plan Note (Signed)
Chronic issue.  Neuropathy seems to be a little worse.  Discussed starting gabapentin though he defers this at this time.  He will let us know if he would like to trial this in the future.

## 2020-04-18 NOTE — Progress Notes (Addendum)
  [name], MD Phone: [phone]  Ronald Hunter is a 69 y.o. male who presents today for f/u.  HYPERTENSION  Disease Monitoring  Home BP Monitoring 130/80 Chest pain- no    Dyspnea- no Medications  Compliance-  Taking lisinopril/hctz.   Edema- no  HYPERLIPIDEMIA Symptoms Chest pain on exertion:  no   Medications: Compliance- taking crestor Right upper quadrant pain- no  Muscle aches- no  Chronic low back pain: Notes this is relatively stable.  He has noted a little bit of increased nerve pain in his right lower leg.  It is below his knees.  Notes a tingly sensation.  This has been chronic in nature since his last back surgery though is a little more noticeable at night recently.  Over-the-counter medicines have not been beneficial.  He has not taken gabapentin previously.      Social History   Tobacco Use  Smoking Status Never Smoker  Smokeless Tobacco Never Used     ROS see history of present illness  Objective  Physical Exam Vitals:   04/18/20 0813  BP: (!) 150/80  Pulse: 72  Temp: (!) 97.5 F (36.4 C)  SpO2: 98%    BP Readings from Last 3 Encounters:  04/18/20 (!) 150/80  10/19/19 130/80  10/27/18 140/70   Wt Readings from Last 3 Encounters:  04/18/20 238 lb 9.6 oz (108.2 kg)  10/19/19 239 lb 6.4 oz (108.6 kg)  10/27/18 229 lb (103.9 kg)    Physical Exam Constitutional:      General: He is not in acute distress.    Appearance: He is not diaphoretic.  Cardiovascular:     Rate and Rhythm: Normal rate and regular rhythm.     Heart sounds: Normal heart sounds.  Pulmonary:     Effort: Pulmonary effort is normal.     Breath sounds: Normal breath sounds.  Musculoskeletal:     Right lower leg: No edema.     Left lower leg: No edema.  Skin:    General: Skin is warm and dry.  Neurological:     Mental Status: He is alert.      Assessment/Plan: Please see individual problem list.  Hypertension Adequate control at home.  Continue current  regimen.  Encouraged him to check several times a week at home and let us know what his readings are in several weeks.  Check CMP.  Hyperlipidemia Check LDL.  Continue Crestor.  Chronic low back pain Chronic issue.  Neuropathy seems to be a little worse.  Discussed starting gabapentin though he defers this at this time.  He will let us know if he would like to trial this in the future.   Orders Placed This Encounter  Procedures  . Comp Met (CMET)  . Direct LDL    No orders of the defined types were placed in this encounter.   This visit occurred during the SARS-CoV-2 public health emergency.  Safety protocols were in place, including screening questions prior to the visit, additional usage of staff PPE, and extensive cleaning of exam room while observing appropriate contact time as indicated for disinfecting solutions.    [name], MD [place]

## 2020-04-18 NOTE — Assessment & Plan Note (Addendum)
Adequate control at home.  Continue current regimen.  Encouraged him to check several times a week at home and let us know what his readings are in several weeks.  Check CMP.

## 2020-04-18 NOTE — Patient Instructions (Signed)
Nice to see you. We will get lab work. Please check your blood pressure 3 times a week for the next several weeks and let me know which her readings are.

## 2020-04-19 LAB — COMPREHENSIVE METABOLIC PANEL
AG Ratio: 1.7 (calc) (ref 1.0–2.5)
ALT: 28 U/L (ref 9–46)
AST: 26 U/L (ref 10–35)
Albumin: 4.2 g/dL (ref 3.6–5.1)
Alkaline phosphatase (APISO): 74 U/L (ref 35–144)
BUN/Creatinine Ratio: 18 (calc) (ref 6–22)
BUN: 11 mg/dL (ref 7–25)
CO2: 27 mmol/L (ref 20–32)
Calcium: 9.1 mg/dL (ref 8.6–10.3)
Chloride: 104 mmol/L (ref 98–110)
Creat: 0.61 mg/dL — ABNORMAL LOW (ref 0.70–1.25)
Globulin: 2.5 g/dL (calc) (ref 1.9–3.7)
Glucose, Bld: 102 mg/dL — ABNORMAL HIGH (ref 65–99)
Potassium: 3.8 mmol/L (ref 3.5–5.3)
Sodium: 140 mmol/L (ref 135–146)
Total Bilirubin: 0.5 mg/dL (ref 0.2–1.2)
Total Protein: 6.7 g/dL (ref 6.1–8.1)

## 2020-04-19 LAB — LDL CHOLESTEROL, DIRECT: Direct LDL: 116 mg/dL — ABNORMAL HIGH (ref <100)

## 2020-04-24 ENCOUNTER — Other Ambulatory Visit: Payer: Self-pay | Admitting: Family Medicine

## 2020-07-08 ENCOUNTER — Ambulatory Visit (INDEPENDENT_AMBULATORY_CARE_PROVIDER_SITE_OTHER): Payer: Medicare Other

## 2020-07-08 ENCOUNTER — Other Ambulatory Visit: Payer: Self-pay

## 2020-07-08 VITALS — Ht 72.0 in | Wt 238.0 lb

## 2020-07-08 DIAGNOSIS — I10 Essential (primary) hypertension: Secondary | ICD-10-CM

## 2020-07-08 DIAGNOSIS — Z0000 Encounter for general adult medical examination without abnormal findings: Secondary | ICD-10-CM

## 2020-07-08 DIAGNOSIS — E785 Hyperlipidemia, unspecified: Secondary | ICD-10-CM

## 2020-07-08 MED ORDER — LISINOPRIL-HYDROCHLOROTHIAZIDE 20-25 MG PO TABS: 1.0000 | ORAL_TABLET | Freq: Every day | ORAL | 0 refills | Status: DC

## 2020-07-08 MED ORDER — ROSUVASTATIN CALCIUM 40 MG PO TABS: 40.0000 mg | ORAL_TABLET | Freq: Every day | ORAL | 0 refills | Status: DC

## 2020-07-08 NOTE — Patient Instructions (Addendum)
[name] , Thank you for taking time to come for your Medicare Wellness Visit. I appreciate your ongoing commitment to your health goals. Please review the following plan we discussed and let me know if I can assist you in the future.   These are the goals we discussed: Goals      Patient Stated   .  Follow up with Primary Care Provider (pt-stated)      Maintain Healthy Lifestyle Stay active Stay hydrated Healthy diet       This is a list of the screening recommended for you and due dates:  Health Maintenance  Topic Date Due  . Flu Shot  07/17/2020  . Colon Cancer Screening  08/27/2023  . Tetanus Vaccine  04/24/2028  . COVID-19 Vaccine  Completed  .  Hepatitis C: One time screening is recommended by Center for Disease Control  (CDC) for  adults born from [birth year] through 1963.   Completed  . Pneumonia vaccines  Completed    Immunizations Immunization History  Administered Date(s) Administered  . Influenza, High Dose Seasonal PF 10/27/2018, 10/07/2019  . Influenza,inj,Quad PF,6+ Mos 10/23/2017  . Pneumococcal Conjugate-13 04/24/2018  . Pneumococcal Polysaccharide-23 07/14/2019  . Tdap 04/24/2018   Keep all routine maintenance appointments.   Follow up 10/19/20 @ 8:30  Advanced directives: Plans to complete later in the year.   Conditions/risks identified: none new.  Follow up in one year for your annual wellness visit.   Preventive Care 67 Years and Older, Male Preventive care refers to lifestyle choices and visits with your health care provider that can promote health and wellness. What does preventive care include?  A yearly physical exam. This is also called an annual well check.  Dental exams once or twice a year.  Routine eye exams. Ask your health care provider how often you should have your eyes checked.  Personal lifestyle choices, including:  Daily care of your teeth and gums.  Regular physical activity.  Eating a healthy diet.  Avoiding tobacco and  drug use.  Limiting alcohol use.  Practicing safe sex.  Taking low doses of aspirin every day.  Taking vitamin and mineral supplements as recommended by your health care provider. What happens during an annual well check? The services and screenings done by your health care provider during your annual well check will depend on your age, overall health, lifestyle risk factors, and family history of disease. Counseling  Your health care provider may ask you questions about your:  Alcohol use.  Tobacco use.  Drug use.  Emotional well-being.  Home and relationship well-being.  Sexual activity.  Eating habits.  History of falls.  Memory and ability to understand (cognition).  Work and work Statistician. Screening  You may have the following tests or measurements:  Height, weight, and BMI.  Blood pressure.  Lipid and cholesterol levels. These may be checked every 5 years, or more frequently if you are over 64 years old.  Skin check.  Lung cancer screening. You may have this screening every year starting at age 67 if you have a 30-pack-year history of smoking and currently smoke or have quit within the past 15 years.  Fecal occult blood test (FOBT) of the stool. You may have this test every year starting at age 67.  Flexible sigmoidoscopy or colonoscopy. You may have a sigmoidoscopy every 5 years or a colonoscopy every 10 years starting at age 67.  Prostate cancer screening. Recommendations will vary depending on your family history and other  risks.  Hepatitis C blood test.  Hepatitis B blood test.  Sexually transmitted disease (STD) testing.  Diabetes screening. This is done by checking your blood sugar (glucose) after you have not eaten for a while (fasting). You may have this done every 1-3 years.  Abdominal aortic aneurysm (AAA) screening. You may need this if you are a current or former smoker.  Osteoporosis. You may be screened starting at age 67 if you are  at high risk. Talk with your health care provider about your test results, treatment options, and if necessary, the need for more tests. Vaccines  Your health care provider may recommend certain vaccines, such as:  Influenza vaccine. This is recommended every year.  Tetanus, diphtheria, and acellular pertussis (Tdap, Td) vaccine. You may need a Td booster every 10 years.  Zoster vaccine. You may need this after age 35.  Pneumococcal 13-valent conjugate (PCV13) vaccine. One dose is recommended after age 67.  Pneumococcal polysaccharide (PPSV23) vaccine. One dose is recommended after age 67. Talk to your health care provider about which screenings and vaccines you need and how often you need them. This information is not intended to replace advice given to you by your health care provider. Make sure you discuss any questions you have with your health care provider. Document Released: 12/30/2015 Document Revised: 08/22/2016 Document Reviewed: 10/04/2015 Elsevier Interactive Patient Education  2017 [employer] Prevention in the Home Falls can cause injuries. They can happen to people of all ages. There are many things you can do to make your home safe and to help prevent falls. What can I do on the outside of my home?  Regularly fix the edges of walkways and driveways and fix any cracks.  Remove anything that might make you trip as you walk through a door, such as a raised step or threshold.  Trim any bushes or trees on the path to your home.  Use bright outdoor lighting.  Clear any walking paths of anything that might make someone trip, such as rocks or tools.  Regularly check to see if handrails are loose or broken. Make sure that both sides of any steps have handrails.  Any raised decks and porches should have guardrails on the edges.  Have any leaves, snow, or ice cleared regularly.  Use sand or salt on walking paths during winter.  Clean up any spills in your garage  right away. This includes oil or grease spills. What can I do in the bathroom?  Use night lights.  Install grab bars by the toilet and in the tub and shower. Do not use towel bars as grab bars.  Use non-skid mats or decals in the tub or shower.  If you need to sit down in the shower, use a plastic, non-slip stool.  Keep the floor dry. Clean up any water that spills on the floor as soon as it happens.  Remove soap buildup in the tub or shower regularly.  Attach bath mats securely with double-sided non-slip rug tape.  Do not have throw rugs and other things on the floor that can make you trip. What can I do in the bedroom?  Use night lights.  Make sure that you have a light by your bed that is easy to reach.  Do not use any sheets or blankets that are too big for your bed. They should not hang down onto the floor.  Have a firm chair that has side arms. You can use this for support  while you get dressed.  Do not have throw rugs and other things on the floor that can make you trip. What can I do in the kitchen?  Clean up any spills right away.  Avoid walking on wet floors.  Keep items that you use a lot in easy-to-reach places.  If you need to reach something above you, use a strong step stool that has a grab bar.  Keep electrical cords out of the way.  Do not use floor polish or wax that makes floors slippery. If you must use wax, use non-skid floor wax.  Do not have throw rugs and other things on the floor that can make you trip. What can I do with my stairs?  Do not leave any items on the stairs.  Make sure that there are handrails on both sides of the stairs and use them. Fix handrails that are broken or loose. Make sure that handrails are as long as the stairways.  Check any carpeting to make sure that it is firmly attached to the stairs. Fix any carpet that is loose or worn.  Avoid having throw rugs at the top or bottom of the stairs. If you do have throw rugs,  attach them to the floor with carpet tape.  Make sure that you have a light switch at the top of the stairs and the bottom of the stairs. If you do not have them, ask someone to add them for you. What else can I do to help prevent falls?  Wear shoes that:  Do not have high heels.  Have rubber bottoms.  Are comfortable and fit you well.  Are closed at the toe. Do not wear sandals.  If you use a stepladder:  Make sure that it is fully opened. Do not climb a closed stepladder.  Make sure that both sides of the stepladder are locked into place.  Ask someone to hold it for you, if possible.  Clearly mark and make sure that you can see:  Any grab bars or handrails.  First and last steps.  Where the edge of each step is.  Use tools that help you move around (mobility aids) if they are needed. These include:  Canes.  Walkers.  Scooters.  Crutches.  Turn on the lights when you go into a dark area. Replace any light bulbs as soon as they burn out.  Set up your furniture so you have a clear path. Avoid moving your furniture around.  If any of your floors are uneven, fix them.  If there are any pets around you, be aware of where they are.  Review your medicines with your doctor. Some medicines can make you feel dizzy. This can increase your chance of falling. Ask your doctor what other things that you can do to help prevent falls. This information is not intended to replace advice given to you by your health care provider. Make sure you discuss any questions you have with your health care provider. Document Released: 09/29/2009 Document Revised: 05/10/2016 Document Reviewed: 01/07/2015 Elsevier Interactive Patient Education  2017 [employer].

## 2020-07-08 NOTE — Progress Notes (Signed)
Subjective:   Ronald Hunter is a 69 y.o. male who presents for Medicare Annual/Subsequent preventive examination.  Review of Systems    No ROS.  Medicare Wellness Virtual Visit.   Cardiac Risk Factors include: advanced age (>60men, >58 women);male gender;hypertension     Objective:    Today's Vitals   07/08/20 0836  Weight: (!) 238 lb (108 kg)  Height: 6' (1.829 m)   Body mass index is 32.28 kg/m.  Advanced Directives 07/08/2020 07/08/2019 08/26/2018 01/28/2015 08/08/2012  Does Patient Have a Medical Advance Directive? No No No No Patient does not have advance directive;Patient would not like information  Would patient like information on creating a medical advance directive? No - Patient declined No - Patient declined - No - patient declined information -  Pre-existing out of facility DNR order (yellow form or pink MOST form) - - - - No    Current Medications (verified) Outpatient Encounter Medications as of 07/08/2020  Medication Sig  . aspirin 81 MG tablet Take 81 mg by mouth daily.  Marland Kitchen lisinopril-hydrochlorothiazide (ZESTORETIC) 20-25 MG tablet Take 1 tablet by mouth once daily  . Multiple Vitamin (MULTIVITAMIN WITH MINERALS) TABS Take 1 tablet by mouth daily.  . rosuvastatin (CRESTOR) 40 MG tablet Take 1 tablet by mouth once daily  . [DISCONTINUED] valACYclovir (VALTREX) 1000 MG tablet Take 2,000 mg by mouth 2 (two) times daily.   No facility-administered encounter medications on file as of 07/08/2020.    Allergies (verified) Patient has no known allergies.   History: Past Medical History:  Diagnosis Date  . Anxiety   . Arthritis   . Chronic back pain    stenosis/spondylosis  . History of bronchitis    as a child  . History of colon polyps   . History of kidney stones   . Hyperlipidemia    takes Crestor daily  . Hypertension    takes Metoprolol daily  . PONV (postoperative nausea and vomiting)    Nausea with one surgery   Past Surgical History:  Procedure  Laterality Date  . COLONOSCOPY    . COLONOSCOPY     several  . COLONOSCOPY W/ POLYPECTOMY    . COLONOSCOPY WITH PROPOFOL N/A 08/26/2018   Procedure: COLONOSCOPY WITH PROPOFOL;  Surgeon: Lucilla Lame, MD;  Location[facility] END[facility]COPY;  Service: Endoscopy;  Laterality: N/A;  . cyst removed from under left arm    . KNEE ARTHROSCOPY     right   . KNEE ARTHROSCOPY W/ ACL RECONSTRUCTION    . LUMBAR LAMINECTOMY/DECOMPRESSION MICRODISCECTOMY  08/11/2012   Procedure: LUMBAR LAMINECTOMY/DECOMPRESSION MICRODISCECTOMY 1 LEVEL;  Surgeon: Hosie Spangle, MD;  Location: [facility]UR[facility] ORS;  Service: Neurosurgery;  Laterality: Right;  RIGHT L45 laminotomy and microdiskectomy  . LUMBAR LAMINECTOMY/DECOMPRESSION MICRODISCECTOMY Right 01/29/2015   Procedure: LUMBAR LAMINECTOMY/DECOMPRESSION MICRODISCECTOMY 1 LEVEL;  Surgeon: Hosie Spangle, MD;  Location: [facility] NEURO ORS;  Service: Neurosurgery;  Laterality: Right;  Right L45 Laminotomy and microdisketomy  . SHOULDER SURGERY     right   . TONSILLECTOMY     Family History  Problem Relation Age of Onset  . Hyperlipidemia Mother   . Heart disease Mother   . Congestive Heart Failure Mother   . Colon cancer Father    Social History   Socioeconomic History  . Marital status: Married    Spouse name: Not on file  . Number of children: Not on file  . Years of education: Not on file  . Highest education level: Not on file  Occupational History  . Not on file  Tobacco Use  . Smoking status: Never Smoker  . Smokeless tobacco: Never Used  Vaping Use  . Vaping Use: Never used  Substance and Sexual Activity  . Alcohol use: Yes    Alcohol/week: 16.0 standard drinks    Types: 14 Glasses of wine, 2 Shots of liquor per week  . Drug use: No  . Sexual activity: Yes  Other Topics Concern  . Not on file  Social History Narrative  . Not on file   Social Determinants of Health   Financial Resource Strain: Low Risk   . Difficulty of Paying Living Expenses: Not hard  at all  Food Insecurity:   . Worried About Charity fundraiser in the Last Year:   . Arboriculturist in the Last Year:   Transportation Needs: No Transportation Needs  . Lack of Transportation (Medical): No  . Lack of Transportation (Non-Medical): No  Physical Activity: Insufficiently Active  . Days of Exercise per Week: 7 days  . Minutes of Exercise per Session: 20 min  Stress: No Stress Concern Present  . Feeling of Stress : Not at all  Social Connections: Unknown  . Frequency of Communication with Friends and Family: Not on file  . Frequency of Social Gatherings with Friends and Family: Not on file  . Attends Religious Services: Not on file  . Active Member of Clubs or Organizations: Not on file  . Attends Archivist Meetings: Not on file  . Marital Status: Married    Tobacco Counseling Counseling given: Not Answered   Clinical Intake:  Pre-visit preparation completed: Yes        Diabetes: No  How often do you need to have someone help you when you read instructions, pamphlets, or other written materials from your doctor or pharmacy?: 1 - Never Interpreter Needed?: No      Activities of Daily Living In your present state of health, do you have any difficulty performing the following activities: 07/08/2020  Hearing? N  Vision? N  Difficulty concentrating or making decisions? N  Walking or climbing stairs? N  Dressing or bathing? N  Doing errands, shopping? N  Preparing Food and eating ? N  Using the Toilet? N  In the past six months, have you accidently leaked urine? N  Do you have problems with loss of bowel control? N  Managing your Medications? N  Managing your Finances? N  Housekeeping or managing your Housekeeping? N  Some recent data might be hidden    Patient Care Team: Leone Haven, MD as PCP - General (Family Medicine)  Indicate any recent Medical Services you may have received from other than [facility] providers in the past year  (date may be approximate).     Assessment:   This is a routine wellness examination for Ronald Hunter.  I connected with Ronald Hunter today by telephone and verified that I am speaking with the correct person using two identifiers. Location patient: home Location provider: work Persons participating in the virtual visit: patient, Marine scientist.    I discussed the limitations, risks, security and privacy concerns of performing an evaluation and management service by telephone and the availability of in person appointments. The patient expressed understanding and verbally consented to this telephonic visit.    Interactive audio and video telecommunications were attempted between this provider and patient, however failed, due to patient having technical difficulties OR patient did not have access to video capability.  We continued  and completed visit with audio only.  Some vital signs may be absent or patient reported.   Hearing/Vision screen  Hearing Screening   125Hz  250Hz  500Hz  1000Hz  2000Hz  3000Hz  4000Hz  6000Hz  8000Hz   Right ear:           Left ear:           Comments: Patient is able to hear conversational tones without difficulty.  No issues reported.  Vision Screening Comments: Followed [facility] Wears corrective lenses Visual acuity not assessed, virtual visit.  They have seen their ophthalmologist in the last 12 months.   Dietary issues and exercise activities discussed: Current Exercise Habits: Home exercise routine, Type of exercise: walking, Time (Minutes): 20, Frequency (Times/Week): 7, Weekly Exercise (Minutes/Week): 140, Intensity: Moderate  Healthy diet Fair water intake Caffeine- 2 cups of coffee  Goals      Patient Stated   .  Follow up with Primary Care Provider (pt-stated)      Maintain Healthy Lifestyle Stay active Stay hydrated Healthy diet      Depression Screen PHQ 2/9 Scores 07/08/2020 04/18/2020 10/19/2019 07/08/2019 07/29/2017  PHQ - 2 Score 0 0 0 0 0    Fall  Risk Fall Risk  07/08/2020 04/18/2020 10/19/2019 07/08/2019 07/29/2017  Falls in the past year? 0 0 0 0 No  Number falls in past yr: 0 0 0 - -  Follow up Falls evaluation completed Falls evaluation completed Falls evaluation completed - -   Handrails in use when climbing stairs? Yes  Home free of loose throw rugs in walkways, pet beds, electrical cords, etc? Yes  Adequate lighting in your home to reduce risk of falls? Yes   ASSISTIVE DEVICES UTILIZED TO PREVENT FALLS: Life alert? No  Use of a cane, walker or w/c? No  Grab bars in the bathroom? No  Shower chair or bench in shower? No  Elevated toilet seat or a handicapped toilet? No   TIMED UP AND GO: Was the test performed? No . Virtual visit.   Cognitive Function: MMSE - Mini Mental State Exam 07/08/2020  Not completed: Unable to complete  Patient is alert and oriented x3.     Immunizations Immunization History  Administered Date(s) Administered  . Influenza, High Dose Seasonal PF 10/27/2018, 10/07/2019  . Influenza,inj,Quad PF,6+ Mos 10/23/2017  . PFIZER SARS-COV-2 Vaccination 01/26/2020, 02/16/2020  . Pneumococcal Conjugate-13 04/24/2018  . Pneumococcal Polysaccharide-23 07/14/2019  . Tdap 04/24/2018   Health Maintenance Health Maintenance  Topic Date Due  . INFLUENZA VACCINE  07/17/2020  . COLONOSCOPY  08/27/2023  . TETANUS/TDAP  04/24/2028  . COVID-19 Vaccine  Completed  . Hepatitis C Screening  Completed  . PNA vac Low Risk Adult  Completed   Dental Screening: Recommended annual dental exams for proper oral hygiene. Visits every 12 months.   Community Resource Referral / Chronic Care Management: CRR required this visit?  No   CCM required this visit?  No      Plan:   Keep all routine maintenance appointments.   Follow up 10/19/20 @ 8:30  I have personally reviewed and noted the following in the patient's chart:   . Medical and social history . Use of alcohol, tobacco or illicit drugs  . Current  medications and supplements . Functional ability and status . Nutritional status . Physical activity . Advanced directives . List of other physicians . Hospitalizations, surgeries, and ER visits in previous 12 months . Vitals . Screenings to include cognitive, depression, and falls . Referrals  and appointments  In addition, I have reviewed and discussed with patient certain preventive protocols, quality metrics, and best practice recommendations. A written personalized care plan for preventive services as well as general preventive health recommendations were provided to patient via mychart.     Varney Biles, LPN   [id number]

## 2020-07-22 ENCOUNTER — Other Ambulatory Visit: Payer: Self-pay | Admitting: Family Medicine

## 2020-07-22 DIAGNOSIS — E785 Hyperlipidemia, unspecified: Secondary | ICD-10-CM

## 2020-10-05 DIAGNOSIS — Z23 Encounter for immunization: Secondary | ICD-10-CM | POA: Diagnosis not present

## 2020-10-19 ENCOUNTER — Ambulatory Visit (INDEPENDENT_AMBULATORY_CARE_PROVIDER_SITE_OTHER): Payer: Medicare Other | Admitting: Family Medicine

## 2020-10-19 ENCOUNTER — Encounter: Payer: Self-pay | Admitting: Family Medicine

## 2020-10-19 ENCOUNTER — Other Ambulatory Visit: Payer: Self-pay

## 2020-10-19 VITALS — BP 140/80 | HR 76 | Temp 98.1°F | Ht 72.0 in | Wt 236.8 lb

## 2020-10-19 DIAGNOSIS — I10 Essential (primary) hypertension: Secondary | ICD-10-CM

## 2020-10-19 DIAGNOSIS — Z125 Encounter for screening for malignant neoplasm of prostate: Secondary | ICD-10-CM | POA: Diagnosis not present

## 2020-10-19 DIAGNOSIS — E785 Hyperlipidemia, unspecified: Secondary | ICD-10-CM | POA: Diagnosis not present

## 2020-10-19 MED ORDER — ROSUVASTATIN CALCIUM 40 MG PO TABS: 40.0000 mg | ORAL_TABLET | Freq: Every day | ORAL | 3 refills | Status: DC

## 2020-10-19 MED ORDER — LISINOPRIL-HYDROCHLOROTHIAZIDE 20-25 MG PO TABS: 1.0000 | ORAL_TABLET | Freq: Every day | ORAL | 3 refills | Status: DC

## 2020-10-19 NOTE — Assessment & Plan Note (Signed)
Check PSA. ?

## 2020-10-19 NOTE — Assessment & Plan Note (Signed)
Adequate control at home.  He will continue lisinopril-HCTZ 20-25 mg tablet once daily.  Check labs today.

## 2020-10-19 NOTE — Progress Notes (Signed)
  Ronald Rumps, MD Phone: [phone]  DMONI Hunter is a 69 y.o. male who presents today for f/u.  HYPERTENSION  Disease Monitoring  Home BP Monitoring typically 120s/70s at home when he does check Chest pain- no    Dyspnea- no Medications  Compliance-  Taking lisinopril, HCTZ.  Edema- no Walks 1-2 miles daily. Eats healthily.  HYPERLIPIDEMIA Symptoms Chest pain on exertion:  no    Medications: Compliance- taking crestor Right upper quadrant pain- no  Muscle aches- no      Social History   Tobacco Use  Smoking Status Never Smoker  Smokeless Tobacco Never Used     ROS see history of present illness  Objective  Physical Exam Vitals:   10/19/20 0840  BP: 140/80  Pulse: 76  Temp: 98.1 F (36.7 C)  SpO2: 97%    BP Readings from Last 3 Encounters:  10/19/20 140/80  04/18/20 (!) 150/80  10/19/19 130/80   Wt Readings from Last 3 Encounters:  10/19/20 236 lb 12.8 oz (107.4 kg)  07/08/20 (!) 238 lb (108 kg)  04/18/20 238 lb 9.6 oz (108.2 kg)    Physical Exam Constitutional:      General: He is not in acute distress.    Appearance: He is not diaphoretic.  Cardiovascular:     Rate and Rhythm: Normal rate and regular rhythm.     Heart sounds: Normal heart sounds.  Pulmonary:     Effort: Pulmonary effort is normal.     Breath sounds: Normal breath sounds.  Musculoskeletal:     Right lower leg: No edema.     Left lower leg: No edema.  Skin:    General: Skin is warm and dry.  Neurological:     Mental Status: He is alert.      Assessment/Plan: Please see individual problem list.  Problem List Items Addressed This Visit    Hyperlipidemia    Check lipid panel.  Continue Crestor 40 mg once daily.      Relevant Medications   lisinopril-hydrochlorothiazide (ZESTORETIC) 20-25 MG tablet   rosuvastatin (CRESTOR) 40 MG tablet   Other Relevant Orders   Lipid panel   Hypertension - Primary    Adequate control at home.  He will continue lisinopril-HCTZ  20-25 mg tablet once daily.  Check labs today.      Relevant Medications   lisinopril-hydrochlorothiazide (ZESTORETIC) 20-25 MG tablet   rosuvastatin (CRESTOR) 40 MG tablet   Other Relevant Orders   Comp Met (CMET)   Prostate cancer screening    Check PSA.       Other Visit Diagnoses    Special screening for malignant neoplasm of prostate       Relevant Orders   PSA   Essential hypertension       Relevant Medications   lisinopril-hydrochlorothiazide (ZESTORETIC) 20-25 MG tablet   rosuvastatin (CRESTOR) 40 MG tablet       Health Maintenance: patient will get his covid 19 booster today. He will monitor for side effects.    This visit occurred during the SARS-CoV-2 public health emergency.  Safety protocols were in place, including screening questions prior to the visit, additional usage of staff PPE, and extensive cleaning of exam room while observing appropriate contact time as indicated for disinfecting solutions.    Ronald Rumps, MD [place]

## 2020-10-19 NOTE — Patient Instructions (Signed)
Nice to see you. We will get labs on you today. Please continue to periodically check your blood pressure.  If it starts to trend up above 130/80 please let us know.

## 2020-10-19 NOTE — Assessment & Plan Note (Signed)
Check lipid panel.  Continue Crestor 40 mg once daily.

## 2020-10-20 LAB — COMPREHENSIVE METABOLIC PANEL
AG Ratio: 2 (calc) (ref 1.0–2.5)
ALT: 31 U/L (ref 9–46)
AST: 26 U/L (ref 10–35)
Albumin: 4.4 g/dL (ref 3.6–5.1)
Alkaline phosphatase (APISO): 66 U/L (ref 35–144)
BUN: 14 mg/dL (ref 7–25)
CO2: 27 mmol/L (ref 20–32)
Calcium: 9.5 mg/dL (ref 8.6–10.3)
Chloride: 104 mmol/L (ref 98–110)
Creat: 0.77 mg/dL (ref 0.70–1.25)
Globulin: 2.2 g/dL (calc) (ref 1.9–3.7)
Glucose, Bld: 103 mg/dL — ABNORMAL HIGH (ref 65–99)
Potassium: 4.2 mmol/L (ref 3.5–5.3)
Sodium: 141 mmol/L (ref 135–146)
Total Bilirubin: 0.4 mg/dL (ref 0.2–1.2)
Total Protein: 6.6 g/dL (ref 6.1–8.1)

## 2020-10-20 LAB — LIPID PANEL
Cholesterol: 166 mg/dL (ref <200)
HDL: 44 mg/dL (ref >=40)
LDL Cholesterol (Calc): 105 mg/dL (calc) — ABNORMAL HIGH
Non-HDL Cholesterol (Calc): 122 mg/dL (calc) (ref <130)
Total CHOL/HDL Ratio: 3.8 (calc) (ref <5.0)
Triglycerides: 84 mg/dL (ref <150)

## 2020-10-20 LAB — PSA: PSA: 2.91 ng/mL (ref <=4.0)

## 2021-04-12 ENCOUNTER — Observation Stay: Payer: Medicare Other

## 2021-04-12 ENCOUNTER — Other Ambulatory Visit: Payer: Self-pay

## 2021-04-12 ENCOUNTER — Emergency Department: Payer: Medicare Other

## 2021-04-12 ENCOUNTER — Inpatient Hospital Stay
Admission: EM | Admit: 2021-04-12 | Discharge: 2021-04-15 | DRG: 149 | Disposition: A | Discharge status: Home / Self Care | Payer: Medicare Other | Attending: Internal Medicine | Admitting: Internal Medicine

## 2021-04-12 DIAGNOSIS — R112 Nausea with vomiting, unspecified: Secondary | ICD-10-CM | POA: Diagnosis not present

## 2021-04-12 DIAGNOSIS — Z6830 Body mass index (BMI) 30.0-30.9, adult: Secondary | ICD-10-CM

## 2021-04-12 DIAGNOSIS — J9811 Atelectasis: Secondary | ICD-10-CM | POA: Diagnosis not present

## 2021-04-12 DIAGNOSIS — R0902 Hypoxemia: Secondary | ICD-10-CM | POA: Diagnosis not present

## 2021-04-12 DIAGNOSIS — S301XXA Contusion of abdominal wall, initial encounter: Secondary | ICD-10-CM | POA: Diagnosis present

## 2021-04-12 DIAGNOSIS — G8929 Other chronic pain: Secondary | ICD-10-CM | POA: Diagnosis present

## 2021-04-12 DIAGNOSIS — H8113 Benign paroxysmal vertigo, bilateral: Secondary | ICD-10-CM

## 2021-04-12 DIAGNOSIS — R110 Nausea: Secondary | ICD-10-CM | POA: Diagnosis not present

## 2021-04-12 DIAGNOSIS — R42 Dizziness and giddiness: Secondary | ICD-10-CM

## 2021-04-12 DIAGNOSIS — Z20822 Contact with and (suspected) exposure to covid-19: Secondary | ICD-10-CM | POA: Diagnosis not present

## 2021-04-12 DIAGNOSIS — Z7982 Long term (current) use of aspirin: Secondary | ICD-10-CM | POA: Diagnosis not present

## 2021-04-12 DIAGNOSIS — Z8249 Family history of ischemic heart disease and other diseases of the circulatory system: Secondary | ICD-10-CM

## 2021-04-12 DIAGNOSIS — Z83438 Family history of other disorder of lipoprotein metabolism and other lipidemia: Secondary | ICD-10-CM

## 2021-04-12 DIAGNOSIS — Z79899 Other long term (current) drug therapy: Secondary | ICD-10-CM

## 2021-04-12 DIAGNOSIS — I10 Essential (primary) hypertension: Secondary | ICD-10-CM

## 2021-04-12 DIAGNOSIS — Y929 Unspecified place or not applicable: Secondary | ICD-10-CM

## 2021-04-12 DIAGNOSIS — E785 Hyperlipidemia, unspecified: Secondary | ICD-10-CM | POA: Diagnosis not present

## 2021-04-12 DIAGNOSIS — H814 Vertigo of central origin: Secondary | ICD-10-CM | POA: Diagnosis present

## 2021-04-12 DIAGNOSIS — J0100 Acute maxillary sinusitis, unspecified: Secondary | ICD-10-CM

## 2021-04-12 DIAGNOSIS — W2209XA Striking against other stationary object, initial encounter: Secondary | ICD-10-CM | POA: Diagnosis present

## 2021-04-12 DIAGNOSIS — E876 Hypokalemia: Secondary | ICD-10-CM | POA: Diagnosis present

## 2021-04-12 DIAGNOSIS — F419 Anxiety disorder, unspecified: Secondary | ICD-10-CM | POA: Diagnosis present

## 2021-04-12 DIAGNOSIS — G463 Brain stem stroke syndrome: Secondary | ICD-10-CM | POA: Diagnosis present

## 2021-04-12 DIAGNOSIS — R630 Anorexia: Secondary | ICD-10-CM | POA: Diagnosis present

## 2021-04-12 DIAGNOSIS — Z981 Arthrodesis status: Secondary | ICD-10-CM

## 2021-04-12 DIAGNOSIS — H5500 Unspecified nystagmus: Secondary | ICD-10-CM | POA: Diagnosis present

## 2021-04-12 DIAGNOSIS — I959 Hypotension, unspecified: Secondary | ICD-10-CM | POA: Diagnosis not present

## 2021-04-12 LAB — COMPREHENSIVE METABOLIC PANEL
ALT: 33 U/L (ref 0–44)
AST: 34 U/L (ref 15–41)
Albumin: 4.5 g/dL (ref 3.5–5.0)
Alkaline Phosphatase: 66 U/L (ref 38–126)
Anion gap: 14 (ref 5–15)
BUN: 16 mg/dL (ref 8–23)
CO2: 20 mmol/L — ABNORMAL LOW (ref 22–32)
Calcium: 9.5 mg/dL (ref 8.9–10.3)
Chloride: 105 mmol/L (ref 98–111)
Creatinine, Ser: 0.7 mg/dL (ref 0.61–1.24)
GFR, Estimated: >60 mL/min (ref >60)
Glucose, Bld: 145 mg/dL — ABNORMAL HIGH (ref 70–99)
Potassium: 2.3 mmol/L — CL (ref 3.5–5.1)
Sodium: 139 mmol/L (ref 135–145)
Total Bilirubin: 0.9 mg/dL (ref 0.3–1.2)
Total Protein: 7.4 g/dL (ref 6.5–8.1)

## 2021-04-12 LAB — CBC WITH DIFFERENTIAL/PLATELET
Abs Immature Granulocytes: 0.04 10*3/uL (ref 0.00–0.07)
Basophils Absolute: 0 10*3/uL (ref 0.0–0.1)
Basophils Relative: 0 %
Eosinophils Absolute: 0.2 10*3/uL (ref 0.0–0.5)
Eosinophils Relative: 2 %
HCT: 42.2 % (ref 39.0–52.0)
Hemoglobin: 14.7 g/dL (ref 13.0–17.0)
Immature Granulocytes: 0 %
Lymphocytes Relative: 47 %
Lymphs Abs: 4.7 10*3/uL — ABNORMAL HIGH (ref 0.7–4.0)
MCH: 30.3 pg (ref 26.0–34.0)
MCHC: 34.8 g/dL (ref 30.0–36.0)
MCV: 87 fL (ref 80.0–100.0)
Monocytes Absolute: 1.1 10*3/uL — ABNORMAL HIGH (ref 0.1–1.0)
Monocytes Relative: 11 %
Neutro Abs: 3.9 10*3/uL (ref 1.7–7.7)
Neutrophils Relative %: 40 %
Platelets: 205 10*3/uL (ref 150–400)
RBC: 4.85 MIL/uL (ref 4.22–5.81)
RDW: 13.6 % (ref 11.5–15.5)
WBC: 9.9 10*3/uL (ref 4.0–10.5)
nRBC: 0 % (ref 0.0–0.2)

## 2021-04-12 LAB — MAGNESIUM: Magnesium: 1.9 mg/dL (ref 1.7–2.4)

## 2021-04-12 LAB — CK: Total CK: 212 U/L (ref 49–397)

## 2021-04-12 LAB — RESP PANEL BY RT-PCR (FLU A&B, COVID) ARPGX2
Influenza A by PCR: NEGATIVE
Influenza B by PCR: NEGATIVE
SARS Coronavirus 2 by RT PCR: NEGATIVE

## 2021-04-12 LAB — TROPONIN I (HIGH SENSITIVITY)
Troponin I (High Sensitivity): 5 ng/L (ref <18)
Troponin I (High Sensitivity): 4 ng/L (ref <18)

## 2021-04-12 LAB — LACTIC ACID, PLASMA
Lactic Acid, Venous: 3.4 mmol/L (ref 0.5–1.9)
Lactic Acid, Venous: 2 mmol/L (ref 0.5–1.9)

## 2021-04-12 LAB — LIPASE, BLOOD: Lipase: 28 U/L (ref 11–51)

## 2021-04-12 MED ORDER — ONDANSETRON HCL 4 MG/2ML IJ SOLN
4.0000 mg | Freq: Once | INTRAMUSCULAR | Status: AC
  Administered 2021-04-12: 4 mg via INTRAVENOUS

## 2021-04-12 MED ORDER — LORAZEPAM 2 MG/ML IJ SOLN
1.0000 mg | Freq: Once | INTRAMUSCULAR | Status: AC
  Administered 2021-04-12: 1 mg via INTRAVENOUS
  Filled 2021-04-12: qty 1

## 2021-04-12 MED ORDER — METOCLOPRAMIDE HCL 5 MG/ML IJ SOLN
10.0000 mg | Freq: Once | INTRAMUSCULAR | Status: AC
  Administered 2021-04-12: 10 mg via INTRAVENOUS
  Filled 2021-04-12: qty 2

## 2021-04-12 MED ORDER — POTASSIUM CHLORIDE 10 MEQ/100ML IV SOLN
10.0000 meq | INTRAVENOUS | Status: AC
  Administered 2021-04-12 – 2021-04-13 (×6): 10 meq via INTRAVENOUS
  Filled 2021-04-12 (×3): qty 100

## 2021-04-12 MED ORDER — LACTATED RINGERS IV BOLUS
1000.0000 mL | Freq: Once | INTRAVENOUS | Status: AC
  Administered 2021-04-12: 1000 mL via INTRAVENOUS

## 2021-04-12 MED ORDER — POTASSIUM CHLORIDE CRYS ER 20 MEQ PO TBCR
80.0000 meq | EXTENDED_RELEASE_TABLET | Freq: Once | ORAL | Status: DC
  Filled 2021-04-12: qty 4

## 2021-04-12 MED ORDER — SODIUM CHLORIDE 0.9 % IV BOLUS
1000.0000 mL | Freq: Once | INTRAVENOUS | Status: AC
  Administered 2021-04-12: 1000 mL via INTRAVENOUS

## 2021-04-12 NOTE — ED Provider Notes (Signed)
[facility] Emergency Department Provider Note  ____________________________________________   None    (approximate)  I have reviewed the triage vital signs and the nursing notes.   HISTORY  Chief Complaint Nausea (Dizziness, nausea, clammy, )    HPI Ronald Hunter is a 70 y.o. male [place] emergency department via EMS.  Patient was outside in the yard picking up x-ray became clammy, dizzy, nauseated.  Patient states he did mow the Y[place] earlier in the day but did not get overheated.  States he has been very dizzy which is causing him to vomit.  Never had a episode of vertigo like this.  He denies chest pain/shortness of breath.  He denies any pain.  He did have a abdominal injury earlier in the week in which he hit a fence.  There is a large bruise on his abdomen.    Past Medical History:  Diagnosis Date  . Anxiety   . Arthritis   . Chronic back pain    stenosis/spondylosis  . History of bronchitis    as a child  . History of colon polyps   . History of kidney stones   . Hyperlipidemia    takes Crestor daily  . Hypertension    takes Metoprolol daily  . PONV (postoperative nausea and vomiting)    Nausea with one surgery    Patient Active Problem List   Diagnosis Date Noted  . Prostate cancer screening 10/19/2019  . Obesity (BMI 30.0-34.9) 10/19/2019  . Right Achilles tendinitis 10/19/2019  . Benign neoplasm of ascending colon   . Rectal polyp   . Benign neoplasm of transverse colon   . Family history of colon cancer in father 10/23/2017  . Hypertension 07/29/2017  . Hyperlipidemia 07/29/2017  . Chronic low back pain 07/29/2017  . HNP (herniated nucleus pulposus), lumbar 01/29/2015    Past Surgical History:  Procedure Laterality Date  . COLONOSCOPY    . COLONOSCOPY     several  . COLONOSCOPY W/ POLYPECTOMY    . COLONOSCOPY WITH PROPOFOL N/A 08/26/2018   Procedure: COLONOSCOPY WITH PROPOFOL;  Surgeon: Lucilla Lame, MD;  Location: [facility]  ENDOSCOPY;  Service: Endoscopy;  Laterality: N/A;  . cyst removed from under left arm    . KNEE ARTHROSCOPY     right   . KNEE ARTHROSCOPY W/ ACL RECONSTRUCTION    . LUMBAR LAMINECTOMY/DECOMPRESSION MICRODISCECTOMY  08/11/2012   Procedure: LUMBAR LAMINECTOMY/DECOMPRESSION MICRODISCECTOMY 1 LEVEL;  Surgeon: Hosie Spangle, MD;  Location: [place] [facility]UR[facility] ORS;  Service: Neurosurgery;  Laterality: Right;  RIGHT L45 laminotomy and microdiskectomy  . LUMBAR LAMINECTOMY/DECOMPRESSION MICRODISCECTOMY Right 01/29/2015   Procedure: LUMBAR LAMINECTOMY/DECOMPRESSION MICRODISCECTOMY 1 LEVEL;  Surgeon: Hosie Spangle, MD;  Location: [place] NEURO ORS;  Service: Neurosurgery;  Laterality: Right;  Right L45 Laminotomy and microdisketomy  . SHOULDER SURGERY     right   . TONSILLECTOMY      Prior to Admission medications   Medication Sig Start Date End Date Taking? Authorizing Provider  aspirin 81 MG tablet Take 81 mg by mouth daily.    [provider]  lisinopril-hydrochlorothiazide (ZESTORETIC) 20-25 MG tablet Take 1 tablet by mouth daily. 10/19/20   Leone Haven, MD  Multiple Vitamin (MULTIVITAMIN WITH MINERALS) TABS Take 1 tablet by mouth daily.    [provider]  rosuvastatin (CRESTOR) 40 MG tablet Take 1 tablet (40 mg total) by mouth daily. 10/19/20   Leone Haven, MD    Allergies Patient has no known allergies.  Family History  Problem Relation Age of Onset  . Hyperlipidemia Mother   . Heart disease Mother   . Congestive Heart Failure Mother   . Colon cancer Father     Social History Social History   Tobacco Use  . Smoking status: Never Smoker  . Smokeless tobacco: Never Used  Vaping Use  . Vaping Use: Never used  Substance Use Topics  . Alcohol use: Yes    Alcohol/week: 16.0 standard drinks    Types: 14 Glasses of wine, 2 Shots of liquor per week  . Drug use: No    Review of Systems  Constitutional: No fever/chills Eyes: No visual changes. ENT: No sore  throat. Respiratory: Denies cough Cardiovascular: Denies chest pain Gastrointestinal: Denies abdominal pain Genitourinary: Negative for dysuria. Musculoskeletal: Negative for back pain. Skin: Negative for rash. Psychiatric: no mood changes,     ____________________________________________   PHYSICAL EXAM:  VITAL SIGNS: ED Triage Vitals [04/12/21 1805]  Enc Vitals Group     BP      Pulse Rate 70     Resp (!) 23     Temp 98.5 F (36.9 C)     Temp Source Oral     SpO2 98 %     Weight 231 lb 7.7 oz (105 kg)     Height 6\' 1"  (1.854 m)     Head Circumference      Peak Flow      Pain Score 0     Pain Loc      Pain Edu?      Excl. in [place]?     Constitutional: Alert and oriented.  Patient is actively vomiting  eyes: Conjunctivae are normal.  Head: Atraumatic. Nose: No congestion/rhinnorhea. Mouth/Throat: Mucous membranes are moist.   Neck:  supple no lymphadenopathy noted Cardiovascular: Normal rate, regular rhythm. Heart sounds are normal Respiratory: Normal respiratory effort.  No retractions, lungs c t a  Abd: soft nontender bs normal all 4 quad, large bruise noted on the lower to mid left lower quadrant GU: deferred Musculoskeletal: FROM all extremities, warm and well perfused Neurologic:  Normal speech and language.  Skin:  Skin is warm, dry and intact. No rash noted. Psychiatric: Mood and affect are normal. Speech and behavior are normal.  ____________________________________________   LABS (all labs ordered are listed, but only abnormal results are displayed)  Labs Reviewed  COMPREHENSIVE METABOLIC PANEL - Abnormal; Notable for the following components:      Result Value   Potassium 2.3 (*)    CO2 20 (*)    Glucose, Bld 145 (*)    All other components within normal limits  LACTIC ACID, PLASMA - Abnormal; Notable for the following components:   Lactic Acid, Venous 3.4 (*)    All other components within normal limits  CBC WITH DIFFERENTIAL/PLATELET -  Abnormal; Notable for the following components:   Lymphs Abs 4.7 (*)    Monocytes Absolute 1.1 (*)    All other components within normal limits  RESP PANEL BY RT-PCR (FLU A&B, COVID) ARPGX2  LACTIC ACID, PLASMA  URINALYSIS, COMPLETE (UACMP) WITH MICROSCOPIC  MAGNESIUM  CK  TROPONIN I (HIGH SENSITIVITY)   ____________________________________________   ____________________________________________  RADIOLOGY  CT of the head  ____________________________________________   PROCEDURES  Procedure(s) performed: EKG, sinus rhythm, see physician read  Procedures    ____________________________________________   INITIAL IMPRESSION / ASSESSMENT AND PLAN / ED COURSE  Pertinent labs & imaging results that were available during my care of the patient  were reviewed by me and considered in my medical decision making (see chart for details).   Patient 70 year old male presents with dizziness and vomiting.  See HPI.  Physical exam shows patient to be actively vomiting.  He is otherwise stable  DDx: Subdural, subarachnoid, vertigo, MI, heatstroke, sepsis  CBC is normal, comprehensive metabolic panel shows potassium of 2.3, CO2 was decreased and glucose is increased at 145, troponin is normal at 5, lactic acid is increased at 3.4  I did discuss the lab findings with Dr. Tamala Julian.  He is to assess the patient.  We will be transferring care of this patient to him at this time.  He did add lab work and a chest x-ray.     ENOS MUHL was evaluated in Emergency Department on 04/12/2021 for the symptoms described in the history of present illness. He was evaluated in the context of the global COVID-19 pandemic, which necessitated consideration that the patient might be at risk for infection with the SARS-CoV-2 virus that causes COVID-19. Institutional protocols and algorithms that pertain to the evaluation of patients at risk for COVID-19 are in a state of rapid change based on information  released by regulatory bodies including the CDC and federal and state organizations. These policies and algorithms were followed during the patient's care in the ED.    As part of my medical decision making, I reviewed the following data within the [place] notes reviewed and incorporated, Labs reviewed , EKG interpreted NSR, Old chart reviewed, Radiograph reviewed , Evaluated by EM attending , Notes from prior ED visits and [place] Controlled Substance Database  ____________________________________________   FINAL CLINICAL IMPRESSION(S) / ED DIAGNOSES  Final diagnoses:  Dizziness      NEW MEDICATIONS STARTED DURING THIS VISIT:  New Prescriptions   No medications on file     Note:  This document was prepared using Dragon voice recognition software and may include unintentional dictation errors.    Versie Starks, PA-C 04/12/21 1932    Vladimir Crofts, MD 04/13/21 [phone]

## 2021-04-12 NOTE — ED Triage Notes (Signed)
Clammy, dizziness, nausea, x 1 day

## 2021-04-12 NOTE — ED Notes (Signed)
Pt vomiting. MD present at bedside. See MAR.

## 2021-04-12 NOTE — ED Notes (Signed)
Patient reports persistent nausea with active vomiting noted. Patient unable to tolerate PO potassium. MD aware. See new orders for IV replacement.

## 2021-04-12 NOTE — H&P (Addendum)
DEMARLO BHOLA [id number] DOB: 09-12-1951 DOA: 04/12/2021   PCP: Leone Haven, MD   Outpatient Specialists: NONE   Patient arrived to ER on 04/12/21 at 1801 Referred by Attending Lucrezia Starch, MD   Patient coming from: home Lives  With family    Chief Complaint:   Chief Complaint  Patient presents with  . Nausea    Dizziness, nausea, clammy,     HPI: TECUMSEH JAVADI is a 70 y.o. male with medical history significant of HTN, HLD,      Presented with  Nausea, dizzy  Lightheaded but a bit of vertigo started to vomit He was in the yard picking up sticks after the wind He has not eaten well today Reports the room was spinning He was transiently short of breath and both arms tingled while being nauseous No syncope  no CP no ABd pain He was mowing the lawn today He drinks one glass of wine with dinner No tobacco  Had an injury last few day ago has a bruise   Infectious risk factors:  Reports  N/V/       Has   been vaccinated against COVID  and boosted   Initial COVID TEST  NEGATIVE   Lab Results  Component Value Date   [place] NEGATIVE 04/12/2021     Regarding pertinent Chronic problems:    Hyperlipidemia -  on statins crestor   Lipid Panel     Component Value Date/Time   CHOL 166 10/19/2020 0902   TRIG 84 10/19/2020 0902   HDL 44 10/19/2020 0902   CHOLHDL 3.8 10/19/2020 0902   LDLCALC 105 (H) 10/19/2020 0902   LDLDIRECT 116 (H) 04/18/2020 0831     HTN on lisinopril, HCTZ.      obesity-   BMI Readings from Last 1 Encounters:  04/12/21 30.54 kg/m   While in ER: CXR CT head unremarkable Still nauseous despite Ativan Reglan and Zofran Noted to have significant hypokalemia down to 2.3 and was unable to tolerate p.o. potassium started on IV potassium replacement  Troponin and EKG unremarkable Initial lactic acid elevated 3.4 but improved after rehydration  ED Triage Vitals  Enc Vitals Group     BP 04/12/21 1807 (!) 145/75     Pulse Rate  04/12/21 1805 70     Resp 04/12/21 1805 (!) 23     Temp 04/12/21 1805 98.5 F (36.9 C)     Temp Source 04/12/21 1805 Oral     SpO2 04/12/21 1805 98 %     Weight 04/12/21 1805 231 lb 7.7 oz (105 kg)     Height 04/12/21 1805 6\' 1"  (1.854 m)     Head Circumference --      Peak Flow --      Pain Score 04/12/21 1805 0     Pain Loc --      Pain Edu? --      Excl. in [place]? --   TMAX(24)@     _________________________________________ Significant initial  Findings: Abnormal Labs Reviewed  COMPREHENSIVE METABOLIC PANEL - Abnormal; Notable for the following components:      Result Value   Potassium 2.3 (*)    CO2 20 (*)    Glucose, Bld 145 (*)    All other components within normal limits  LACTIC ACID, PLASMA - Abnormal; Notable for the following components:   Lactic Acid, Venous 2.0 (*)    All other components within normal limits  LACTIC ACID, PLASMA - Abnormal; Notable  for the following components:   Lactic Acid, Venous 3.4 (*)    All other components within normal limits  CBC WITH DIFFERENTIAL/PLATELET - Abnormal; Notable for the following components:   Lymphs Abs 4.7 (*)    Monocytes Absolute 1.1 (*)    All other components within normal limits   ____________________________________________ Ordered CT HEAD  NON acute  CXR - NON acute  _________________________ Troponin 4  ECG: Ordered Personally reviewed by me showing: HR :67 Rhythm:  NSR,    no evidence of ischemic changes QTC 458 The recent clinical data is shown below. Vitals:   04/12/21 2200 04/12/21 2215 04/12/21 2230 04/12/21 2245  BP: 139/75 (!) 147/80 (!) 142/80 137/80  Pulse: 71 72 68 73  Resp: (!) 21 20 17 19   Temp:      TempSrc:      SpO2: 98% 98% 100% 100%  Weight:      Height:          WBC     Component Value Date/Time   WBC 9.9 04/12/2021 1805   LYMPHSABS 4.7 (H) 04/12/2021 1805   MONOABS 1.1 (H) 04/12/2021 1805   EOSABS 0.2 04/12/2021 1805   BASOSABS 0.0 04/12/2021 1805     Lactic Acid,  Venous    Component Value Date/Time   LATICACIDVEN 2.0 ([place]) 04/12/2021 1943       UA   ordered    Results for orders placed or performed during the hospital encounter of 04/12/21  Resp Panel by RT-PCR (Flu A&B, Covid) Nasopharyngeal Swab     Status: None   Collection Time: 04/12/21  7:42 PM   Specimen: Nasopharyngeal Swab; Nasopharyngeal(NP) swabs in vial transport medium  Result Value Ref Range Status   SARS Coronavirus 2 by RT PCR NEGATIVE NEGATIVE Final         Influenza A by PCR NEGATIVE NEGATIVE Final   Influenza B by PCR NEGATIVE NEGATIVE Final         _______________________________________________ Hospitalist was called for admission for hypokalemia and intractable nausea and vomiting  The following Work up has been ordered so far:  Orders Placed This Encounter  Procedures  . Resp Panel by RT-PCR (Flu A&B, Covid) Nasopharyngeal Swab  . CT Head Wo Contrast  . DG Chest 2 View  . Comprehensive metabolic panel  . Lactic acid, plasma  . CBC with Differential  . Urinalysis, Complete w Microscopic  . Magnesium  . CK  . Potassium  . Lipase, blood  . Consult to hospitalist  . Airborne and Contact precautions  . ED EKG  . EKG 12-Lead  . Saline lock IV     Following Medications were ordered in ER: Medications  potassium chloride 10 mEq in 100 mL IVPB (0 mEq Intravenous Stopped 04/12/21 2247)  sodium chloride 0.9 % bolus 1,000 mL (0 mLs Intravenous Stopped 04/12/21 2043)  metoCLOPramide (REGLAN) injection 10 mg (10 mg Intravenous Given 04/12/21 1816)  LORazepam (ATIVAN) injection 1 mg (1 mg Intravenous Given 04/12/21 1816)  lactated ringers bolus 1,000 mL (0 mLs Intravenous Stopped 04/12/21 2151)  lactated ringers bolus 1,000 mL (0 mLs Intravenous Stopped 04/12/21 2250)  ondansetron (ZOFRAN) injection 4 mg (4 mg Intravenous Given 04/12/21 2154)        Consult Orders  (From admission, onward)         Start     Ordered   04/12/21 2234  Consult to hospitalist  Once        Provider:  (Not yet assigned)  Question  Answer Comment  Place call to: [phone]   Reason for Consult Admit   Diagnosis/Clinical Info for Consult: intractable N/V      04/12/21 2233           OTHER Significant initial  Findings:  labs showing:   Recent Labs  Lab 04/12/21 1805 04/12/21 1942  NA 139  --   K 2.3*  --   CO2 20*  --   GLUCOSE 145*  --   BUN 16  --   CREATININE 0.70  --   CALCIUM 9.5  --   MG  --  1.9    Cr   stable,    Lab Results  Component Value Date   CREATININE 0.70 04/12/2021   CREATININE 0.77 10/19/2020   CREATININE 0.61 (L) 04/18/2020    Recent Labs  Lab 04/12/21 1805  AST 34  ALT 33  ALKPHOS 66  BILITOT 0.9  PROT 7.4  ALBUMIN 4.5   Lab Results  Component Value Date   CALCIUM 9.5 04/12/2021      Plt: Lab Results  Component Value Date   PLT 205 04/12/2021       Recent Labs  Lab 04/12/21 1805  WBC 9.9  NEUTROABS 3.9  HGB 14.7  HCT 42.2  MCV 87.0  PLT 205    HG/HCT  Stable,     Component Value Date/Time   HGB 14.7 04/12/2021 1805   HGB 14.8 06/16/2012 1449   HCT 42.2 04/12/2021 1805   HCT 44.6 06/16/2012 1449   MCV 87.0 04/12/2021 1805   MCV 93 06/16/2012 1449     Recent Labs  Lab 04/12/21 1806  LIPASE 28     Cardiac Panel (last 3 results) Recent Labs    04/12/21 1942  CKTOTAL 212       Cultures: No results found for: [place], [place], [place], REPTSTATUS   Radiological Exams on Admission: DG Chest 2 View  Result Date: 04/12/2021 CLINICAL DATA:  Nausea and dizziness x1 day EXAM: CHEST - 2 VIEW COMPARISON:  April 08, 2012 FINDINGS: The heart size and mediastinal contours are within normal limits. Low lung volumes with bibasilar atelectasis. No pleural effusion. No visible pneumothorax. Spondylosis. IMPRESSION: Low lung volumes with bibasilar atelectasis. Electronically Signed   By: Dahlia Bailiff MD   On: 04/12/2021 19:49   CT Head Wo Contrast  Result Date: 04/12/2021 CLINICAL DATA:  Nonspecific  dizziness.  Nausea. EXAM: CT HEAD WITHOUT CONTRAST TECHNIQUE: Contiguous axial images were obtained from the base of the skull through the vertex without intravenous contrast. COMPARISON:  None. FINDINGS: Brain: No intracranial hemorrhage, mass effect, or midline shift. No hydrocephalus. The basilar cisterns are patent. No evidence of territorial infarct or acute ischemia. No extra-axial or intracranial fluid collection. Vascular: No hyperdense vessel. Skull: No fracture or focal lesion. Sinuses/Orbits: No mastoid effusion. Opacification of the right maxillary sinus with remote postsurgical change. Scattered ethmoid air cell mucosal thickening. Unremarkable orbits. Other: None. IMPRESSION: 1. No acute intracranial abnormality. 2. Chronic right maxillary sinusitis. Electronically Signed   By: Keith Rake M.D.   On: 04/12/2021 19:40   _______________________________________________________________________________________________________ Latest  Blood pressure 137/80, pulse 73, temperature 98.5 F (36.9 C), temperature source Oral, resp. rate 19, height 6\' 1"  (1.854 m), weight 105 kg, SpO2 100 %.   Review of Systems:    Pertinent positives include:   nausea, vomiting,   Constitutional:  No weight loss, night sweats, Fevers, chills, fatigue, weight loss  HEENT:  No headaches, Difficulty swallowing,Tooth/dental problems,Sore throat,  No sneezing, itching, ear ache, nasal congestion, post nasal drip,  Cardio-vascular:  No chest pain, Orthopnea, PND, anasarca, dizziness, palpitations.no Bilateral lower extremity swelling  GI:  No heartburn, indigestion, abdominal pain, diarrhea, change in bowel habits, loss of appetite, melena, blood in stool, hematemesis Resp:  no shortness of breath at rest. No dyspnea on exertion, No excess mucus, no productive cough, No non-productive cough, No coughing up of blood.No change in color of mucus.No wheezing. Skin:  no rash or lesions. No jaundice GU:  no  dysuria, change in color of urine, no urgency or frequency. No straining to urinate.  No flank pain.  Musculoskeletal:  No joint pain or no joint swelling. No decreased range of motion. No back pain.  Psych:  No change in mood or affect. No depression or anxiety. No memory loss.  Neuro: no localizing neurological complaints, no tingling, no weakness, no double vision, no gait abnormality, no slurred speech, no confusion  All systems reviewed and apart from [place] all are negative _______________________________________________________________________________________________ Past Medical History:   Past Medical History:  Diagnosis Date  . Anxiety   . Arthritis   . Chronic back pain    stenosis/spondylosis  . History of bronchitis    as a child  . History of colon polyps   . History of kidney stones   . Hyperlipidemia    takes Crestor daily  . Hypertension    takes Metoprolol daily  . PONV (postoperative nausea and vomiting)    Nausea with one surgery     Past Surgical History:  Procedure Laterality Date  . COLONOSCOPY    . COLONOSCOPY     several  . COLONOSCOPY W/ POLYPECTOMY    . COLONOSCOPY WITH PROPOFOL N/A 08/26/2018   Procedure: COLONOSCOPY WITH PROPOFOL;  Surgeon: Lucilla Lame, MD;  Location[facility] ENDOSCOPY;  Service: Endoscopy;  Laterality: N/A;  . cyst removed from under left arm    . KNEE ARTHROSCOPY     right   . KNEE ARTHROSCOPY W/ ACL RECONSTRUCTION    . LUMBAR LAMINECTOMY/DECOMPRESSION MICRODISCECTOMY  08/11/2012   Procedure: LUMBAR LAMINECTOMY/DECOMPRESSION MICRODISCECTOMY 1 LEVEL;  Surgeon: Hosie Spangle, MD;  Location: [facility] NEURO ORS;  Service: Neurosurgery;  Laterality: Right;  RIGHT L45 laminotomy and microdiskectomy  . LUMBAR LAMINECTOMY/DECOMPRESSION MICRODISCECTOMY Right 01/29/2015   Procedure: LUMBAR LAMINECTOMY/DECOMPRESSION MICRODISCECTOMY 1 LEVEL;  Surgeon: Hosie Spangle, MD;  Location: [place] NEURO ORS;  Service: Neurosurgery;  Laterality: Right;   Right L45 Laminotomy and microdisketomy  . SHOULDER SURGERY     right   . TONSILLECTOMY      Social History:  Ambulatory  independently        reports that he has never smoked. He has never used smokeless tobacco. He reports current alcohol use of about 16.0 standard drinks of alcohol per week. He reports that he does not use drugs.   Family History:   Family History  Problem Relation Age of Onset  . Hyperlipidemia Mother   . Heart disease Mother   . Congestive Heart Failure Mother   . Colon cancer Father    ______________________________________________________________________________________________ Allergies: No Known Allergies   Prior to Admission medications   Medication Sig Start Date End Date Taking? Authorizing Provider  aspirin 81 MG tablet Take 81 mg by mouth daily.    [provider]  lisinopril-hydrochlorothiazide (ZESTORETIC) 20-25 MG tablet Take 1 tablet by mouth daily. 10/19/20   Leone Haven, MD  Multiple Vitamin (MULTIVITAMIN WITH MINERALS) TABS Take 1 tablet by mouth  daily.    [provider]  rosuvastatin (CRESTOR) 40 MG tablet Take 1 tablet (40 mg total) by mouth daily. 10/19/20   Leone Haven, MD    ___________________________________________________________________________________________________ Physical Exam: Vitals with BMI 04/12/2021 04/12/2021 04/12/2021  Height - - -  Weight - - -  BMI - - -  Systolic 0000000 [id number]  Diastolic 80 80 80  Pulse 73 68 72     1. General:  in No  Acute distress   Chronically ill  -appearing 2. Psychological: Alert and  Oriented 3. Head/ENT:    Dry Mucous Membranes                          Head Non traumatic, neck supple                          Poor Dentition 4. SKIN:  decreased Skin turgor,  Skin clean Dry and intact no rash 5. Heart: Regular rate and rhythm no Murmur, no Rub or gallop 6. Lungs:  no wheezes or crackles   7. Abdomen: Soft,   non-tender, Non distended bowel sounds  present 8. Lower extremities: no clubbing, cyanosis, no edema 9. Neurologically  strength 5 out of 5 in all 4 extremities cranial nerves II through XII intact patient reports he has right foot drop but it is not well appreciated at this time on exam 10. MSK: Normal range of motion    Chart has been reviewed  ______________________________________________________________________________________________  Assessment/Plan  70 y.o. male with medical history significant of HTN, HLD,     Admitted for   hypokalemia and intractable nausea and vomiting  Present on Admission: . Central vestibular vertigo - CT head WNL, order MRI brain may need CTA head/neck discuss with tele neurology Pending work up results may need neurology consult in AM Pt/OT eva   . Intractable nausea and vomiting - supportive management, Zofran/ reglan Ativan PRN  . Hypertension - allow permissive HTN for tonight  . Hyperlipidemia - restart home statin when able to tolerate   Hypokalemia - - will replace and repeat in AM,  check magnesium level and replace as needed  Other plan as per orders.  DVT prophylaxis:  SCD     Code Status:    Code Status: Prior FULL CODE  as per patient   I had personally discussed CODE STATUS with patient and family      Family Communication:   Family  at  Bedside  plan of care was discussed on the phone with  Wife,  Disposition Plan:         To home once workup is complete and patient is stable   Following barriers for discharge:                            Electrolytes corrected                                                            Nausea/vomiting  controlled with PO medications  Will need consultants to evaluate patient prior to discharge                      Would benefit from PT/OT eval prior to DC  Ordered                                       Consults called: none  Admission status:  ED  Disposition    ED Disposition Condition [place]: [place] [[id number]]  Level of Care: Med-Surg [16]  Covid Evaluation: Asymptomatic Screening Protocol (No Symptoms)  Diagnosis: Intractable nausea and vomiting [id number]  Admitting Physician: Toy Baker [[id number]]  Attending Physician: Toy Baker [[id number]]       Obs     Level of care    tele  For   24H     Lab Results  Component Value Date   [place] 04/12/2021     Precautions: admitted as   Covid Negative    PPE: Used by the provider:   N95  eye Goggles,  Gloves     Jamahl Lemmons 04/13/2021, 12:19 AM    [facility]     after 2 AM please page floor coverage PA If 7AM-7PM, please contact the day team taking care of the patient using [email or web address]   Patient was evaluated in the context of the global COVID-19 pandemic, which necessitated consideration that the patient might be at risk for infection with the SARS-CoV-2 virus that causes COVID-19. Institutional protocols and algorithms that pertain to the evaluation of patients at risk for COVID-19 are in a state of rapid change based on information released by regulatory bodies including the CDC and federal and state organizations. These policies and algorithms were followed during the patient's care.

## 2021-04-13 ENCOUNTER — Observation Stay (HOSPITAL_BASED_OUTPATIENT_CLINIC_OR_DEPARTMENT_OTHER)
Admit: 2021-04-13 | Discharge: 2021-04-13 | Disposition: A | Discharge status: Home / Self Care | Payer: Medicare Other | Attending: Internal Medicine | Admitting: Internal Medicine

## 2021-04-13 ENCOUNTER — Observation Stay: Payer: Medicare Other

## 2021-04-13 ENCOUNTER — Other Ambulatory Visit: Payer: Medicare Other

## 2021-04-13 ENCOUNTER — Encounter: Payer: Self-pay | Admitting: Internal Medicine

## 2021-04-13 DIAGNOSIS — S301XXA Contusion of abdominal wall, initial encounter: Secondary | ICD-10-CM | POA: Diagnosis present

## 2021-04-13 DIAGNOSIS — Z981 Arthrodesis status: Secondary | ICD-10-CM | POA: Diagnosis not present

## 2021-04-13 DIAGNOSIS — E876 Hypokalemia: Secondary | ICD-10-CM | POA: Diagnosis present

## 2021-04-13 DIAGNOSIS — I10 Essential (primary) hypertension: Secondary | ICD-10-CM

## 2021-04-13 DIAGNOSIS — R42 Dizziness and giddiness: Secondary | ICD-10-CM

## 2021-04-13 DIAGNOSIS — J0100 Acute maxillary sinusitis, unspecified: Secondary | ICD-10-CM | POA: Diagnosis not present

## 2021-04-13 DIAGNOSIS — J320 Chronic maxillary sinusitis: Secondary | ICD-10-CM | POA: Diagnosis not present

## 2021-04-13 DIAGNOSIS — G459 Transient cerebral ischemic attack, unspecified: Secondary | ICD-10-CM | POA: Diagnosis not present

## 2021-04-13 DIAGNOSIS — H8113 Benign paroxysmal vertigo, bilateral: Secondary | ICD-10-CM

## 2021-04-13 DIAGNOSIS — R112 Nausea with vomiting, unspecified: Secondary | ICD-10-CM | POA: Diagnosis not present

## 2021-04-13 DIAGNOSIS — Z8249 Family history of ischemic heart disease and other diseases of the circulatory system: Secondary | ICD-10-CM | POA: Diagnosis not present

## 2021-04-13 DIAGNOSIS — W2209XA Striking against other stationary object, initial encounter: Secondary | ICD-10-CM | POA: Diagnosis present

## 2021-04-13 DIAGNOSIS — Y929 Unspecified place or not applicable: Secondary | ICD-10-CM | POA: Diagnosis not present

## 2021-04-13 DIAGNOSIS — Z20822 Contact with and (suspected) exposure to covid-19: Secondary | ICD-10-CM | POA: Diagnosis present

## 2021-04-13 DIAGNOSIS — H814 Vertigo of central origin: Secondary | ICD-10-CM | POA: Diagnosis present

## 2021-04-13 DIAGNOSIS — I6523 Occlusion and stenosis of bilateral carotid arteries: Secondary | ICD-10-CM | POA: Diagnosis not present

## 2021-04-13 DIAGNOSIS — H5500 Unspecified nystagmus: Secondary | ICD-10-CM | POA: Diagnosis present

## 2021-04-13 DIAGNOSIS — R110 Nausea: Secondary | ICD-10-CM | POA: Diagnosis not present

## 2021-04-13 DIAGNOSIS — Z83438 Family history of other disorder of lipoprotein metabolism and other lipidemia: Secondary | ICD-10-CM | POA: Diagnosis not present

## 2021-04-13 DIAGNOSIS — E785 Hyperlipidemia, unspecified: Secondary | ICD-10-CM | POA: Diagnosis present

## 2021-04-13 DIAGNOSIS — F419 Anxiety disorder, unspecified: Secondary | ICD-10-CM | POA: Diagnosis present

## 2021-04-13 DIAGNOSIS — G463 Brain stem stroke syndrome: Secondary | ICD-10-CM | POA: Diagnosis present

## 2021-04-13 DIAGNOSIS — Z6830 Body mass index (BMI) 30.0-30.9, adult: Secondary | ICD-10-CM | POA: Diagnosis not present

## 2021-04-13 DIAGNOSIS — G8929 Other chronic pain: Secondary | ICD-10-CM | POA: Diagnosis present

## 2021-04-13 DIAGNOSIS — Z79899 Other long term (current) drug therapy: Secondary | ICD-10-CM | POA: Diagnosis not present

## 2021-04-13 DIAGNOSIS — Z7982 Long term (current) use of aspirin: Secondary | ICD-10-CM | POA: Diagnosis not present

## 2021-04-13 DIAGNOSIS — R630 Anorexia: Secondary | ICD-10-CM | POA: Diagnosis present

## 2021-04-13 LAB — CBC WITH DIFFERENTIAL/PLATELET
Abs Immature Granulocytes: 0.02 10*3/uL (ref 0.00–0.07)
Basophils Absolute: 0 10*3/uL (ref 0.0–0.1)
Basophils Relative: 0 %
Eosinophils Absolute: 0 10*3/uL (ref 0.0–0.5)
Eosinophils Relative: 0 %
HCT: 37.6 % — ABNORMAL LOW (ref 39.0–52.0)
Hemoglobin: 13.3 g/dL (ref 13.0–17.0)
Immature Granulocytes: 0 %
Lymphocytes Relative: 16 %
Lymphs Abs: 1 10*3/uL (ref 0.7–4.0)
MCH: 31.1 pg (ref 26.0–34.0)
MCHC: 35.4 g/dL (ref 30.0–36.0)
MCV: 87.9 fL (ref 80.0–100.0)
Monocytes Absolute: 0.3 10*3/uL (ref 0.1–1.0)
Monocytes Relative: 4 %
Neutro Abs: 5 10*3/uL (ref 1.7–7.7)
Neutrophils Relative %: 80 %
Platelets: 167 10*3/uL (ref 150–400)
RBC: 4.28 MIL/uL (ref 4.22–5.81)
RDW: 13.9 % (ref 11.5–15.5)
WBC: 6.3 10*3/uL (ref 4.0–10.5)
nRBC: 0 % (ref 0.0–0.2)

## 2021-04-13 LAB — URINE DRUG SCREEN, QUALITATIVE (ARMC ONLY)
Amphetamines, Ur Screen: NOT DETECTED
Barbiturates, Ur Screen: NOT DETECTED
Benzodiazepine, Ur Scrn: NOT DETECTED
Cannabinoid 50 Ng, Ur ~~LOC~~: NOT DETECTED
Cocaine Metabolite,Ur ~~LOC~~: NOT DETECTED
MDMA (Ecstasy)Ur Screen: NOT DETECTED
Methadone Scn, Ur: NOT DETECTED
Opiate, Ur Screen: NOT DETECTED
Phencyclidine (PCP) Ur S: NOT DETECTED
Tricyclic, Ur Screen: NOT DETECTED

## 2021-04-13 LAB — URINALYSIS, COMPLETE (UACMP) WITH MICROSCOPIC
Bacteria, UA: NONE SEEN
Bilirubin Urine: NEGATIVE
Glucose, UA: NEGATIVE mg/dL
Hgb urine dipstick: NEGATIVE
Ketones, ur: 20 mg/dL — AB
Leukocytes,Ua: NEGATIVE
Nitrite: NEGATIVE
Protein, ur: NEGATIVE mg/dL
Specific Gravity, Urine: 1.017 (ref 1.005–1.030)
Squamous Epithelial / HPF: NONE SEEN (ref 0–5)
pH: 7 (ref 5.0–8.0)

## 2021-04-13 LAB — ETHANOL: Alcohol, Ethyl (B): <10 mg/dL (ref <10)

## 2021-04-13 LAB — COMPREHENSIVE METABOLIC PANEL
ALT: 28 U/L (ref 0–44)
AST: 26 U/L (ref 15–41)
Albumin: 3.6 g/dL (ref 3.5–5.0)
Alkaline Phosphatase: 56 U/L (ref 38–126)
Anion gap: 7 (ref 5–15)
BUN: 12 mg/dL (ref 8–23)
CO2: 25 mmol/L (ref 22–32)
Calcium: 8.1 mg/dL — ABNORMAL LOW (ref 8.9–10.3)
Chloride: 105 mmol/L (ref 98–111)
Creatinine, Ser: 0.48 mg/dL — ABNORMAL LOW (ref 0.61–1.24)
GFR, Estimated: >60 mL/min (ref >60)
Glucose, Bld: 132 mg/dL — ABNORMAL HIGH (ref 70–99)
Potassium: 4.3 mmol/L (ref 3.5–5.1)
Sodium: 137 mmol/L (ref 135–145)
Total Bilirubin: 0.8 mg/dL (ref 0.3–1.2)
Total Protein: 6.1 g/dL — ABNORMAL LOW (ref 6.5–8.1)

## 2021-04-13 LAB — ECHOCARDIOGRAM COMPLETE
AR max vel: 1.89 cm2
AV Area VTI: 1.96 cm2
AV Area mean vel: 1.73 cm2
AV Mean grad: 6 mmHg
AV Peak grad: 11.8 mmHg
Ao pk vel: 1.72 m/s
Area-P 1/2: 3.58 cm2
Height: 73 in
MV VTI: 2.54 cm2
S' Lateral: 2.5 cm
Weight: 3703.73 oz

## 2021-04-13 LAB — PHOSPHORUS
Phosphorus: 3.9 mg/dL (ref 2.5–4.6)
Phosphorus: 3.8 mg/dL (ref 2.5–4.6)

## 2021-04-13 LAB — LACTIC ACID, PLASMA
Lactic Acid, Venous: 1.9 mmol/L (ref 0.5–1.9)
Lactic Acid, Venous: 1.6 mmol/L (ref 0.5–1.9)

## 2021-04-13 LAB — MAGNESIUM: Magnesium: 1.6 mg/dL — ABNORMAL LOW (ref 1.7–2.4)

## 2021-04-13 LAB — HEMOGLOBIN A1C
Hgb A1c MFr Bld: 5.5 % (ref 4.8–5.6)
Mean Plasma Glucose: 111.15 mg/dL

## 2021-04-13 LAB — POTASSIUM: Potassium: 4.2 mmol/L (ref 3.5–5.1)

## 2021-04-13 LAB — LIPID PANEL
Cholesterol: 149 mg/dL (ref 0–200)
HDL: 43 mg/dL (ref >40)
LDL Cholesterol: 97 mg/dL (ref 0–99)
Total CHOL/HDL Ratio: 3.5 RATIO
Triglycerides: 46 mg/dL (ref <150)
VLDL: 9 mg/dL (ref 0–40)

## 2021-04-13 LAB — HIV ANTIBODY (ROUTINE TESTING W REFLEX): HIV Screen 4th Generation wRfx: NONREACTIVE

## 2021-04-13 LAB — TSH: TSH: 1.281 u[IU]/mL (ref 0.350–4.500)

## 2021-04-13 MED ORDER — MECLIZINE HCL 25 MG PO TABS
25.0000 mg | ORAL_TABLET | Freq: Three times a day (TID) | ORAL | Status: DC
  Administered 2021-04-13 – 2021-04-15 (×7): 25 mg via ORAL
  Filled 2021-04-13 (×9): qty 1

## 2021-04-13 MED ORDER — STROKE: EARLY STAGES OF RECOVERY BOOK: Freq: Once | Status: DC

## 2021-04-13 MED ORDER — METOCLOPRAMIDE HCL 5 MG/ML IJ SOLN
5.0000 mg | Freq: Four times a day (QID) | INTRAMUSCULAR | Status: DC | PRN
  Administered 2021-04-13 – 2021-04-14 (×4): 5 mg via INTRAVENOUS
  Filled 2021-04-13 (×5): qty 2

## 2021-04-13 MED ORDER — LORAZEPAM 2 MG/ML IJ SOLN
0.5000 mg | Freq: Once | INTRAMUSCULAR | Status: AC
  Administered 2021-04-13: 0.5 mg via INTRAVENOUS
  Filled 2021-04-13: qty 1

## 2021-04-13 MED ORDER — HYDROCODONE-ACETAMINOPHEN 5-325 MG PO TABS: 1.0000 | ORAL_TABLET | ORAL | Status: DC | PRN

## 2021-04-13 MED ORDER — PERFLUTREN LIPID MICROSPHERE
1.0000 mL | INTRAVENOUS | Status: AC | PRN
Start: 2021-04-13 — End: 2021-04-13
  Administered 2021-04-13: 2 mL via INTRAVENOUS
  Filled 2021-04-13: qty 10

## 2021-04-13 MED ORDER — ACETAMINOPHEN 650 MG RE SUPP: 650.0000 mg | Freq: Four times a day (QID) | RECTAL | Status: DC | PRN

## 2021-04-13 MED ORDER — SODIUM CHLORIDE 0.9 % IV SOLN: INTRAVENOUS | Status: DC

## 2021-04-13 MED ORDER — ACETAMINOPHEN 325 MG PO TABS
650.0000 mg | ORAL_TABLET | Freq: Four times a day (QID) | ORAL | Status: DC | PRN
  Administered 2021-04-14: 650 mg via ORAL
  Filled 2021-04-13: qty 2

## 2021-04-13 MED ORDER — LISINOPRIL 20 MG PO TABS
20.0000 mg | ORAL_TABLET | Freq: Every day | ORAL | Status: DC
  Administered 2021-04-13: 21:00:00 20 mg via ORAL
  Filled 2021-04-13: qty 1

## 2021-04-13 MED ORDER — SODIUM CHLORIDE 0.9 % IV SOLN
12.5000 mg | Freq: Once | INTRAVENOUS | Status: DC | PRN
  Filled 2021-04-13: qty 0.5

## 2021-04-13 MED ORDER — ASPIRIN 325 MG PO TABS
325.0000 mg | ORAL_TABLET | Freq: Once | ORAL | Status: AC
  Administered 2021-04-13: 325 mg via ORAL
  Filled 2021-04-13: qty 1

## 2021-04-13 MED ORDER — ONDANSETRON HCL 4 MG PO TABS
4.0000 mg | ORAL_TABLET | Freq: Four times a day (QID) | ORAL | Status: DC | PRN
  Administered 2021-04-13: 4 mg via ORAL
  Filled 2021-04-13: qty 1

## 2021-04-13 MED ORDER — SODIUM CHLORIDE 0.9 % IV BOLUS
500.0000 mL | Freq: Once | INTRAVENOUS | Status: AC
  Administered 2021-04-13: 500 mL via INTRAVENOUS

## 2021-04-13 MED ORDER — AMOXICILLIN-POT CLAVULANATE 875-125 MG PO TABS
1.0000 | ORAL_TABLET | Freq: Two times a day (BID) | ORAL | Status: DC
  Administered 2021-04-13 – 2021-04-15 (×5): 1 via ORAL
  Filled 2021-04-13 (×5): qty 1

## 2021-04-13 MED ORDER — SODIUM CHLORIDE 0.9 % IV SOLN
12.5000 mg | Freq: Once | INTRAVENOUS | Status: AC | PRN
  Administered 2021-04-13: 14:00:00 12.5 mg via INTRAVENOUS
  Filled 2021-04-13: qty 0.5

## 2021-04-13 MED ORDER — ONDANSETRON HCL 4 MG/2ML IJ SOLN
4.0000 mg | Freq: Four times a day (QID) | INTRAMUSCULAR | Status: DC
  Administered 2021-04-13 – 2021-04-15 (×7): 4 mg via INTRAVENOUS
  Filled 2021-04-13 (×7): qty 2

## 2021-04-13 MED ORDER — SODIUM CHLORIDE 0.9 % IV SOLN
75.0000 mL/h | INTRAVENOUS | Status: DC
  Administered 2021-04-13: 04:00:00 75 mL/h via INTRAVENOUS

## 2021-04-13 MED ORDER — ONDANSETRON HCL 4 MG/2ML IJ SOLN
4.0000 mg | Freq: Four times a day (QID) | INTRAMUSCULAR | Status: DC | PRN
  Administered 2021-04-13: 4 mg via INTRAVENOUS
  Filled 2021-04-13: qty 2

## 2021-04-13 MED ORDER — ROSUVASTATIN CALCIUM 20 MG PO TABS
40.0000 mg | ORAL_TABLET | Freq: Every day | ORAL | Status: DC
  Administered 2021-04-13 – 2021-04-14 (×2): 40 mg via ORAL
  Filled 2021-04-13 (×3): qty 2

## 2021-04-13 MED ORDER — MAGNESIUM SULFATE 2 GM/50ML IV SOLN
2.0000 g | Freq: Once | INTRAVENOUS | Status: AC
  Administered 2021-04-13: 08:00:00 2 g via INTRAVENOUS
  Filled 2021-04-13: qty 50

## 2021-04-13 MED ORDER — IOHEXOL 350 MG/ML SOLN
75.0000 mL | Freq: Once | INTRAVENOUS | Status: AC | PRN
  Administered 2021-04-13: 75 mL via INTRAVENOUS

## 2021-04-13 NOTE — ED Notes (Signed)
Spoke with MRI who states they will be to get the pt shortly.

## 2021-04-13 NOTE — ED Notes (Signed)
Pt transported to CT by this RN.

## 2021-04-13 NOTE — ED Notes (Signed)
Patient transported to MRI 

## 2021-04-13 NOTE — Progress Notes (Signed)
OT Cancellation Note  Patient Details Name: Ronald Hunter MRN: [id number] DOB: 10/10/1951   Cancelled Treatment:    Reason Eval/Treat Not Completed: Medical issues which prohibited therapy. Upon 2nd attempt, spoke with PT who just saw pt. Per PT, pt continues to be very nauseated with any movement/mobility. Will hold OT evaluation at this time and re-attempt next date as medically appropriate.   Hanley Hays, MPH, MS, OTR/L ascom [phone] 04/13/21, 2:59 PM

## 2021-04-13 NOTE — Progress Notes (Signed)
*  PRELIMINARY RESULTS* Echocardiogram 2D Echocardiogram has been performed.  Ronald Hunter 04/13/2021, 12:17 PM

## 2021-04-13 NOTE — Consult Note (Addendum)
[facility] TeleNeurology Consult Services  Stat Consult  Date of Service:   04/12/2021 23:56:45  Diagnosis:     .  R42 - Dizziness/ Vertigo/ Giddiness     .  H81.13 - Benign Positional Vertigo     .  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 Also discussed the case with the family. On examination he has mild nystagmus with positional dizziness. The clinical suspicion is, high for benign positional vertigo. I cannot rule out a brainstem stroke. CT scan of the head was reported as negative. I would suggest doing CT angiograms of head and neck if possible. He is not a candidate for alteplase based on the duration of symptoms and NIH of 0. I would suggest considering MRI of the head and symptomatic treatment of his dizziness and nausea if the CTAs are negative. He would need to be admitted for further work-up.  CT HEAD: Showed No Acute Hemorrhage or Acute Core Infarct  Our recommendations are outlined below.  Diagnostic Studies: Recommend MRI brain without contrast  Laboratory Studies: Recommend Lipid panel Hemoglobin A1c  Medication: Initiate Aspirin 325 mg daily Statins for LDL goal less than 70 Permissive hypertension, Antihypertensives with prn for first 24-48 hrs post stroke onset. If BP greater than 220/120 give Labetalol IV or Vasotec IV  Nursing Recommendations: Telemetry, IV Fluids, avoid dextrose containing fluids, Maintain euglycemia Neuro checks q4 hrs x 24 hrs and then per shift Head of bed 30 degrees  Consultations: Recommend Speech therapy if failed dysphagia screen Physical therapy/Occupational therapy  DVT Prophylaxis: Choice of Primary Team  Disposition: Neurology will follow   Metrics: [facility] Notification Time: 04/12/2021 23:52:12 Stamp Time: 04/12/2021 23:56:45 Callback Response Time: 04/12/2021  23:57:05   ----------------------------------------------------------------------------------------------------  Chief Complaint: Dizziness  History of Present Illness: Patient is a 70 year old Male.  70 year old male with past medical history of hypertension, hyperlipidemia came to the hospital because of sudden onset dizziness. He was picking some twigs off the ground when he suddenly developed dizziness. He reports this as a spinning sensation. The symptoms are better if he stays still with his eyes closed. He also reports some nausea and vomiting with it. He denies any focal numbness or tingling. Denies any speech or swallowing issues. Denies any visual problems. Did have some balance problems for the symptoms started. Denies any headache with it.    Past Medical History:     . Hypertension     . Hyperlipidemia     . There is NO history of Diabetes Mellitus     . There is NO history of Atrial Fibrillation     . There is NO history of Coronary Artery Disease     . There is NO history of Stroke     . There is NO history of Covid-19  Anticoagulant use:  No  Antiplatelet use: Yes Aspirin 81    Examination: BP(158/78), Pulse(78), Blood Glucose(128) 1A: Level of Consciousness - Alert; keenly responsive + 0 1B: Ask Month and Age - Both Questions Right + 0 1C: Blink Eyes & Squeeze Hands - Performs Both Tasks + 0 2: Test Horizontal Extraocular Movements - Normal + 0 3: Test Visual Fields - No Visual Loss + 0 4: Test Facial Palsy (Use Grimace if Obtunded) - Normal symmetry + 0 5A: Test Left Arm Motor Drift - No Drift for 10 Seconds + 0 5B: Test Right Arm Motor Drift - No Drift for 10 Seconds +  0 6A: Test Left Leg Motor Drift - No Drift for 5 Seconds + 0 6B: Test Right Leg Motor Drift - No Drift for 5 Seconds + 0 7: Test Limb Ataxia (FNF/Heel-Shin) - No Ataxia + 0 8: Test Sensation - Normal; No sensory loss + 0 9: Test Language/Aphasia - Normal; No aphasia + 0 10: Test Dysarthria  - Normal + 0 11: Test Extinction/Inattention - No abnormality + 0  NIHSS Score: 0     Patient / Family was informed the Neurology Consult would occur via TeleHealth consult by way of interactive audio and video telecommunications and consented to receiving care in this manner.  Patient is being evaluated for possible acute neurologic impairment and high probability of imminent or life - threatening deterioration.I spent total of 30 minutes providing care to this patient, including time for face to face visit via telemedicine, review of medical records, imaging studies and discussion of findings with providers, the patient and / or family.   Dr Faustino Congress   [facility] [phone]  Case [id number]  Addendum: On Prelim Review of Head CTA, Basilar open with no clearcut LVO. Please call if any questions [phone]

## 2021-04-13 NOTE — ED Notes (Signed)
Teleneuro cart brought into room for neuro consult per admitting MD.

## 2021-04-13 NOTE — Progress Notes (Signed)
Pt vomited 3 times when trying to get VS. unable to do orthostatic VS unable to stand due to dizzy and nausea. Will give reglan unable to give zofran until 0937. Paged [employer]

## 2021-04-13 NOTE — Plan of Care (Signed)
Will continue to progress. 

## 2021-04-13 NOTE — Progress Notes (Signed)
SLP Cancellation Note  Patient Details Name: Ronald Hunter MRN: [id number] DOB: 07/31/1951   Cancelled treatment:       Reason Eval/Treat Not Completed: SLP screened, no needs identified, will sign off (chart reviewed; consulted NSG then met w/ pt/wife). Pt denied any difficulty swallowing and is drinking thin liquids; tolerates swallowing pills w/ water per NSG. Pt is having some feelings of N/V so is on clear liquid diet right now per MD order (recommend upgrade to Regular diet when MD feels is appropriate). Pt conversed at conversational level w/out deficits noted; pt and Wife denied any speech-language deficits. Pt has a great sense of humor.  No further skilled ST services indicated as pt appears at his baseline. Pt agreed. NSG to reconsult if any change in status while admitted.       Katherine Watson, MS, CCC-SLP Speech Language Pathologist [facility] [phone] Watson,Katherine 04/13/2021, 12:24 PM   

## 2021-04-13 NOTE — Progress Notes (Signed)
Pt still c/0 nausea and has vomited twice. Requested additional PRN med. Paged wieting. See new orders

## 2021-04-13 NOTE — ED Provider Notes (Addendum)
I assumed care of this patient at approximately 8pm on 4/27 from Ashok Cordia. In brief, patient presents with N/V and vertigo.   Very subtle nystagmus but otherwise non-focal neuro exam. STROKE score 0. Initial workup reassuring including ECG, troponin, CT head, CXR, and KUB. CBC w/o leukocytosis or anemia. CMP w/ K 2.3 and no other significant derangements. Initial lactic slightly elevated at 3.4-> 2 with IVF. Suspect likely peripheral vertigo vs acute infectious gastritis. COVID and Influenza Neg. Admitted to hospitalist for persistent N/V after reglan, zofran, and ativan.    Lucrezia Starch, MD 04/13/21 1139    Lucrezia Starch, MD 04/13/21 1140

## 2021-04-13 NOTE — Progress Notes (Signed)
PT Cancellation Note  Patient Details Name: Ronald Hunter MRN: [id number] DOB: January 18, 1951   Cancelled Treatment:    Reason Eval/Treat Not Completed: Medical issues which prohibited therapy Chart reviewed attempted to see pt in the AM and again this afternoon.  Each time he was feeling terribly and states that essentially any time he moves his head, much less tries to get up he begins to vomits, has severe vertigo and simply cannot tolerate it.  He was pleasant, however, and without moving was able to give a history of his symptoms.  He has had sustained severe vertigo, nausea with frequent vomiting and intolerance to any movement.  Symptoms started while he was bent over cleaning up lawn debris, reports no recent changes in diet or activity. Will attempt PT exam tomorrow.  Kreg Shropshire, DPT 04/13/2021, 4:03 PM

## 2021-04-13 NOTE — Progress Notes (Signed)
OT Cancellation Note  Patient Details Name: Ronald Hunter MRN: [id number] DOB: 12/05/1951   Cancelled Treatment:    Reason Eval/Treat Not Completed: Medical issues which prohibited therapy. Consult received, chart reviewed. Spoke with RN who reports pt still very dizzy and nauseated. RN preparing to give additional medications to address. Will hold OT evaluation at this time and re-attempt at later time.   Hanley Hays, MPH, MS, OTR/L ascom [phone] 04/13/21, 11:25 AM

## 2021-04-13 NOTE — TOC Progression Note (Signed)
Transition of Care [facility]) - Progression Note    Patient Details  Name: SONIA STICKELS MRN: [id number] Date of Birth: 06-04-1951  Transition of Care [facility]) CM/SW [place], RN Phone Number: 04/13/2021, 3:53 PM  Clinical Narrative:   TOC in to see patient and spouse at bedside.  Patient lives with his wife, no concerns about getting to appointments and getting medications.  Wife is caregiver support if/as needed as both are retired.  Patient and spouse do not foresee any TOC needs at this time unless patient situation changes based on this hospitalization.  TOC contact information given.    Expected Discharge Plan: Home/Self Care Barriers to Discharge: Continued Medical Work up  Expected Discharge Plan and Services Expected Discharge Plan: Home/Self Care   Discharge Planning Services: CM Consult Post Acute Care Choice: NA Living arrangements for the past 2 months: Single Family Home                 DME Arranged:  (n/a)           HH Agency:  (n/a)         Social Determinants of Health (SDOH) Interventions    Readmission Risk Interventions No flowsheet data found.

## 2021-04-13 NOTE — Progress Notes (Signed)
Patient ID: Ronald Hunter, male   DOB: 10/09/1951, 70 y.o.   MRN: [id number] [facility] PROGRESS NOTE  Ronald Hunter WEX:[id number] DOB: Nov 04, 1951 DOA: 04/12/2021 PCP: Leone Haven, MD  HPI/Subjective: Patient admitted yesterday with vertigo.  Today can hardly open his eyes without feeling dizzy.  Worse when he moves his head from side to side.  Vomited earlier this morning with standing up.  Patient does recall an infection couple weeks ago.  He had a lot of sinus drainage at that time.  Objective: Vitals:   04/13/21 0805 04/13/21 1130  BP: (!) 157/71 134/61  Pulse: 81 77  Resp: 19 17  Temp: 98.4 F (36.9 C) 98 F (36.7 C)  SpO2: 99% 98%    Intake/Output Summary (Last 24 hours) at 04/13/2021 1448 Last data filed at 04/13/2021 0500 Gross per 24 hour  Intake 1091.52 ml  Output --  Net 1091.52 ml   Filed Weights   04/12/21 1805  Weight: 105 kg    ROS: Review of Systems  Respiratory: Negative for shortness of breath.   Cardiovascular: Negative for chest pain.  Gastrointestinal: Positive for nausea and vomiting. Negative for abdominal pain.  Neurological: Positive for dizziness.   Exam: Physical Exam HENT:     Head: Normocephalic.     Mouth/Throat:     Pharynx: No oropharyngeal exudate.  Eyes:     General: Lids are normal.     Conjunctiva/sclera: Conjunctivae normal.     Pupils: Pupils are equal, round, and reactive to light.     Comments: Extraocular muscles intact but does have horizontal nystagmus bilateral eyes when moving eyes in different directions.  Cardiovascular:     Rate and Rhythm: Normal rate and regular rhythm.     Heart sounds: Normal heart sounds, S1 normal and S2 normal.  Pulmonary:     Breath sounds: Normal breath sounds. No decreased breath sounds, wheezing, rhonchi or rales.  Abdominal:     Palpations: Abdomen is soft.     Tenderness: There is no abdominal tenderness.  Musculoskeletal:     Right lower leg: No swelling.     Left lower  leg: No swelling.  Skin:    General: Skin is warm.     Findings: No rash.  Neurological:     Mental Status: He is alert and oriented to person, place, and time.     Comments: Power 5 out of 5 upper and lower extremities.       Data Reviewed: Basic Metabolic Panel: Recent Labs  Lab 04/12/21 1805 04/12/21 1942 04/13/21 0223 04/13/21 0347  NA 139  --   --  137  K 2.3*  --  4.2 4.3  CL 105  --   --  105  CO2 20*  --   --  25  GLUCOSE 145*  --   --  132*  BUN 16  --   --  12  CREATININE 0.70  --   --  0.48*  CALCIUM 9.5  --   --  8.1*  MG  --  1.9  --  1.6*  PHOS  --  3.9  --  3.8   Liver Function Tests: Recent Labs  Lab 04/12/21 1805 04/13/21 0347  AST 34 26  ALT 33 28  ALKPHOS 66 56  BILITOT 0.9 0.8  PROT 7.4 6.1*  ALBUMIN 4.5 3.6   Recent Labs  Lab 04/12/21 1806  LIPASE 28   CBC: Recent Labs  Lab 04/12/21 1805 04/13/21 0347  WBC 9.9 6.3  NEUTROABS 3.9 5.0  HGB 14.7 13.3  HCT 42.2 37.6*  MCV 87.0 87.9  PLT 205 167   Cardiac Enzymes: Recent Labs  Lab 04/12/21 1942  CKTOTAL 212     Recent Results (from the past 240 hour(s))  Resp Panel by RT-PCR (Flu A&B, Covid) Nasopharyngeal Swab     Status: None   Collection Time: 04/12/21  7:42 PM   Specimen: Nasopharyngeal Swab; Nasopharyngeal(NP) swabs in vial transport medium  Result Value Ref Range Status   SARS Coronavirus 2 by RT PCR NEGATIVE NEGATIVE Final    Comment: (NOTE) SARS-CoV-2 target nucleic acids are NOT DETECTED.  The SARS-CoV-2 RNA is generally detectable in upper respiratory specimens during the acute phase of infection. The lowest concentration of SARS-CoV-2 viral copies this assay can detect is 138 copies/mL. A negative result does not preclude SARS-Cov-2 infection and should not be used as the sole basis for treatment or other patient management decisions. A negative result may occur with  improper specimen collection/handling, submission of specimen other than nasopharyngeal  swab, presence of viral mutation(s) within the areas targeted by this assay, and inadequate number of viral copies(<138 copies/mL). A negative result must be combined with clinical observations, patient history, and epidemiological information. The expected result is Negative.  Fact Sheet for Patients:  EntrepreneurPulse.com.au  Fact Sheet for Healthcare Providers:  IncredibleEmployment.be  This test is no t yet approved or cleared by the [place] FDA and  has been authorized for detection and/or diagnosis of SARS-CoV-2 by FDA under an Emergency Use Authorization (EUA). This EUA will remain  in effect (meaning this test can be used) for the duration of the COVID-19 declaration under Section 564(b)(1) of the Act, 21 U.S.C.section 360bbb-3(b)(1), unless the authorization is terminated  or revoked sooner.       Influenza A by PCR NEGATIVE NEGATIVE Final   Influenza B by PCR NEGATIVE NEGATIVE Final    Comment: (NOTE) The Xpert Xpress SARS-CoV-2/FLU/RSV plus assay is intended as an aid in the diagnosis of influenza from Nasopharyngeal swab specimens and should not be used as a sole basis for treatment. Nasal washings and aspirates are unacceptable for Xpert Xpress SARS-CoV-2/FLU/RSV testing.  Fact Sheet for Patients: EntrepreneurPulse.com.au  Fact Sheet for Healthcare Providers: IncredibleEmployment.be  This test is not yet approved or cleared by the [place] FDA and has been authorized for detection and/or diagnosis of SARS-CoV-2 by FDA under an Emergency Use Authorization (EUA). This EUA will remain in effect (meaning this test can be used) for the duration of the COVID-19 declaration under Section 564(b)(1) of the Act, 21 U.S.C. section 360bbb-3(b)(1), unless the authorization is terminated or revoked.  Performed at [facility], [address] [place], [place] [address]       Studies: DG Chest 2 View  Result Date: 04/12/2021 CLINICAL DATA:  Nausea and dizziness x1 day EXAM: CHEST - 2 VIEW COMPARISON:  April 08, 2012 FINDINGS: The heart size and mediastinal contours are within normal limits. Low lung volumes with bibasilar atelectasis. No pleural effusion. No visible pneumothorax. Spondylosis. IMPRESSION: Low lung volumes with bibasilar atelectasis. Electronically Signed   By: Dahlia Bailiff MD   On: 04/12/2021 19:49   DG Abdomen 1 View  Result Date: 04/12/2021 CLINICAL DATA:  Nausea and vomiting. EXAM: ABDOMEN - 1 VIEW COMPARISON:  None. FINDINGS: No bowel dilatation to suggest obstruction. Small volume of colonic stool. No radiopaque calculi or abnormal soft tissue calcifications. No suspicious intra-abdominal mass effect no acute  osseous abnormalities are seen. IMPRESSION: Unremarkable radiograph of the abdomen. Electronically Signed   By: Keith Rake M.D.   On: 04/12/2021 23:43   CT Head Wo Contrast  Result Date: 04/12/2021 CLINICAL DATA:  Nonspecific dizziness.  Nausea. EXAM: CT HEAD WITHOUT CONTRAST TECHNIQUE: Contiguous axial images were obtained from the base of the skull through the vertex without intravenous contrast. COMPARISON:  None. FINDINGS: Brain: No intracranial hemorrhage, mass effect, or midline shift. No hydrocephalus. The basilar cisterns are patent. No evidence of territorial infarct or acute ischemia. No extra-axial or intracranial fluid collection. Vascular: No hyperdense vessel. Skull: No fracture or focal lesion. Sinuses/Orbits: No mastoid effusion. Opacification of the right maxillary sinus with remote postsurgical change. Scattered ethmoid air cell mucosal thickening. Unremarkable orbits. Other: None. IMPRESSION: 1. No acute intracranial abnormality. 2. Chronic right maxillary sinusitis. Electronically Signed   By: Keith Rake M.D.   On: 04/12/2021 19:40   MR BRAIN WO CONTRAST  Result Date: 04/13/2021 CLINICAL DATA:  Initial  evaluation for acute vertigo. EXAM: MRI HEAD WITHOUT CONTRAST TECHNIQUE: Multiplanar, multiecho pulse sequences of the brain and surrounding structures were obtained without intravenous contrast. COMPARISON:  Prior CTA from earlier the same day. FINDINGS: Brain: Cerebral volume within normal limits. Patchy T2/FLAIR hyperintensity within the periventricular deep white matter both cerebral hemispheres most consistent with chronic small vessel ischemic disease, mild in nature. No abnormal foci of restricted diffusion to suggest acute or subacute ischemia. Gray-white matter differentiation maintained. No encephalomalacia to suggest chronic cortical infarction. No evidence for acute or chronic intracranial hemorrhage. No mass lesion, midline shift or mass effect. No hydrocephalus or extra-axial fluid collection. Pituitary gland suprasellar region normal. Midline structures intact. Vascular: Major intracranial vascular flow voids are maintained. Skull and upper cervical spine: Craniocervical junction normal. Bone marrow signal intensity normal. No scalp soft tissue abnormality. Sinuses/Orbits: Globes and orbital soft tissues within normal limits. Chronic right worse than left maxillary sinusitis. No mastoid effusion. Inner ear structures grossly normal. Other: None. IMPRESSION: 1. No acute intracranial abnormality. 2. Mild chronic microvascular ischemic disease for age. 3. Chronic right worse than left maxillary sinusitis. Electronically Signed   By: Jeannine Boga M.D.   On: 04/13/2021 02:22   ECHOCARDIOGRAM COMPLETE  Result Date: 04/13/2021    ECHOCARDIOGRAM REPORT   Patient Name:   Ronald Hunter Date of Exam: 04/13/2021 Medical Rec #:  [id number]   Height:       73.0 in Accession #:    [id number]  Weight:       231.5 lb Date of Birth:  [birth year]   BSA:          2.290 m Patient Age:    34 years    BP:           157/71 mmHg Patient Gender: M           HR:           77 bpm. Exam Location:  [facility] Procedure: 2D Echo,  Color Doppler, Cardiac Doppler and Intracardiac            Opacification Agent Indications:     G45.9 TIA  History:         Patient has no prior history of Echocardiogram examinations.                  Signs/Symptoms:Dizziness/Lightheadedness; Risk                  Factors:Hypertension and Dyslipidemia.  Sonographer:  Humphrey Rolls RDCS (AE) Referring Phys:  Kenn File DOUTOVA Diagnosing Phys: Julien Nordmann MD  Sonographer Comments: Suboptimal apical window and suboptimal subcostal window. Image acquisition challenging due to patient body habitus. IMPRESSIONS  1. Left ventricular ejection fraction, by estimation, is 60 to 65%. The left ventricle has normal function. The left ventricle has no regional wall motion abnormalities. Left ventricular diastolic parameters are consistent with Grade I diastolic dysfunction (impaired relaxation).  2. Right ventricular systolic function is normal. The right ventricular size is normal.  3. Left atrial size was mildly dilated.  4. The mitral valve is normal in structure. No evidence of mitral valve regurgitation. No evidence of mitral stenosis. FINDINGS  Left Ventricle: Left ventricular ejection fraction, by estimation, is 60 to 65%. The left ventricle has normal function. The left ventricle has no regional wall motion abnormalities. Definity contrast agent was given IV to delineate the left ventricular  endocardial borders. The left ventricular internal cavity size was normal in size. There is no left ventricular hypertrophy. Left ventricular diastolic parameters are consistent with Grade I diastolic dysfunction (impaired relaxation). Right Ventricle: The right ventricular size is normal. No increase in right ventricular wall thickness. Right ventricular systolic function is normal. Left Atrium: Left atrial size was mildly dilated. Right Atrium: Right atrial size was normal in size. Pericardium: There is no evidence of pericardial effusion. Mitral Valve: The mitral valve  is normal in structure. No evidence of mitral valve regurgitation. No evidence of mitral valve stenosis. MV peak gradient, 3.1 mmHg. The mean mitral valve gradient is 2.0 mmHg. Tricuspid Valve: The tricuspid valve is normal in structure. Tricuspid valve regurgitation is not demonstrated. No evidence of tricuspid stenosis. Aortic Valve: The aortic valve is normal in structure. Aortic valve regurgitation is not visualized. No aortic stenosis is present. Aortic valve mean gradient measures 6.0 mmHg. Aortic valve peak gradient measures 11.8 mmHg. Aortic valve area, by VTI measures 1.96 cm. Pulmonic Valve: The pulmonic valve was normal in structure. Pulmonic valve regurgitation is mild. No evidence of pulmonic stenosis. Aorta: The aortic root is normal in size and structure. Venous: The inferior vena cava is normal in size with greater than 50% respiratory variability, suggesting right atrial pressure of 3 mmHg. IAS/Shunts: No atrial level shunt detected by color flow Doppler.  LEFT VENTRICLE PLAX 2D LVIDd:         4.40 cm  Diastology LVIDs:         2.50 cm  LV e' medial:    8.92 cm/s LV PW:         1.10 cm  LV E/e' medial:  9.1 LV IVS:        1.00 cm  LV e' lateral:   10.40 cm/s LVOT diam:     2.10 cm  LV E/e' lateral: 7.8 LV SV:         62 LV SV Index:   27 LVOT Area:     3.46 cm  LEFT ATRIUM         Index LA diam:    4.20 cm 1.83 cm/m  AORTIC VALVE                    PULMONIC VALVE AV Area (Vmax):    1.89 cm     PV Vmax:       1.31 m/s AV Area (Vmean):   1.73 cm     PV Vmean:      67.600 cm/s AV Area (VTI):     1.96  cm     PV VTI:        0.203 m AV Vmax:           172.00 cm/s  PV Peak grad:  6.9 mmHg AV Vmean:          115.000 cm/s PV Mean grad:  2.0 mmHg AV VTI:            0.317 m AV Peak Grad:      11.8 mmHg AV Mean Grad:      6.0 mmHg LVOT Vmax:         94.00 cm/s LVOT Vmean:        57.500 cm/s LVOT VTI:          0.179 m LVOT/AV VTI ratio: 0.56  AORTA Ao Root diam: 3.80 cm MITRAL VALVE               TRICUSPID  VALVE MV Area (PHT): 3.58 cm    TR Peak grad:   26.8 mmHg MV Area VTI:   2.54 cm    TR Vmax:        259.00 cm/s MV Peak grad:  3.1 mmHg MV Mean grad:  2.0 mmHg    SHUNTS MV Vmax:       0.88 m/s    Systemic VTI:  0.18 m MV Vmean:      63.0 cm/s   Systemic Diam: 2.10 cm MV Decel Time: 212 msec MV E velocity: 81.00 cm/s MV A velocity: 79.70 cm/s MV E/A ratio:  1.02 Ida Rogue MD Electronically signed by Ida Rogue MD Signature Date/Time: 04/13/2021/2:09:41 PM    Final    CT ANGIO HEAD NECK W WO CM (CODE STROKE)  Result Date: 04/13/2021 CLINICAL DATA:  Initial evaluation for acute dizziness, vertigo, vomiting. EXAM: CT ANGIOGRAPHY HEAD AND NECK TECHNIQUE: Multidetector CT imaging of the head and neck was performed using the standard protocol during bolus administration of intravenous contrast. Multiplanar CT image reconstructions and MIPs were obtained to evaluate the vascular anatomy. Carotid stenosis measurements (when applicable) are obtained utilizing NASCET criteria, using the distal internal carotid diameter as the denominator. CONTRAST:  6mL OMNIPAQUE IOHEXOL 350 MG/ML SOLN COMPARISON:  Prior head CT from earlier the same day. FINDINGS: CTA NECK FINDINGS Aortic arch: Visualized aortic arch normal in caliber with normal branch pattern. No hemodynamically significant stenosis seen about the origin of the great vessels. Right carotid system: Right common carotid artery patent from its origin to the bifurcation without stenosis. Mild for age atheromatous plaque about the right carotid bulb without significant stenosis. Right ICA widely patent distally without stenosis, dissection or occlusion. Left carotid system: Left common carotid artery widely patent from its origin to the bifurcation without stenosis. Eccentric mixed plaque at the left carotid bulb without hemodynamically significant stenosis. Left ICA widely patent distally without stenosis, dissection or occlusion. Vertebral arteries: Both  vertebral arteries arise from the subclavian arteries. No proximal subclavian artery stenosis. Atheromatous plaque at the origins of both vertebral arteries with no more than mild stenosis. Vertebral arteries otherwise patent distally without stenosis, dissection or occlusion. Skeleton: No visible acute osseous abnormality. No discrete or worrisome osseous lesions. Moderate cervical spondylosis present at C5-6 and C6-7 without high-grade spinal stenosis. Other neck: No other acute soft tissue abnormality within the neck. No mass or adenopathy. Chronic maxillary sinusitis noted, right worse than left. Upper chest: Visualized upper chest demonstrates no acute finding. Mild atelectatic changes noted within the visualized lungs. Review of the MIP images confirms the  above findings CTA HEAD FINDINGS Anterior circulation: Petrous segments patent bilaterally. Mild atheromatous change within the carotid siphons without hemodynamically significant stenosis. A1 segments widely patent. Normal anterior communicating artery complex. Anterior cerebral arteries patent to their distal aspects without stenosis. No M1 stenosis or occlusion. Normal MCA bifurcations. Distal MCA branches well perfused and symmetric. Posterior circulation: Both V4 segments patent to the vertebrobasilar junction without stenosis. Left vertebral artery slightly dominant. Both PICA origins patent and normal. Basilar patent to its distal aspect without stenosis. Superior cerebellar arteries patent bilaterally. Right PCA supplied via the basilar. Fetal type origin of the left PCA. Both PCAs perfused to their distal aspects without stenosis. Venous sinuses: Grossly patent allowing for timing the contrast bolus. Anatomic variants: Fetal type origin of the left PCA. No intracranial aneurysm. Review of the MIP images confirms the above findings IMPRESSION: 1. Negative CTA for large vessel occlusion. 2. Mild for age atheromatous change about the carotid  bifurcations and carotid siphons without hemodynamically significant stenosis. 3. Chronic maxillary sinusitis, right worse than left. Findings communicated by telephone at the time of interpretation on 04/13/2021 at 1:40 am to provider Dr. Mitzi Hansen. Electronically Signed   By: Jeannine Boga M.D.   On: 04/13/2021 01:48    Scheduled Meds: .  stroke: mapping our early stages of recovery book   Does not apply Once  . amoxicillin-clavulanate  1 tablet Oral Q12H  . lisinopril  20 mg Oral QHS  . meclizine  25 mg Oral TID  . rosuvastatin  40 mg Oral Daily   Continuous Infusions: . sodium chloride 50 mL/hr at 04/13/21 0739    Assessment/Plan:  1. Benign positional vertigo.  MRI of the brain negative.  Start meclizine.  Physical therapy evaluation.  Today patient unable to lift his head up off the pillow without having severe symptoms with nausea or vomiting.  Standing dose meclizine.  IV fluid hydration 2. Nausea vomiting.  As needed nausea medications. 3. Sinusitis seen on MRI.  Start Augmentin 4. Hypokalemia.  Potassium replaced into the normal range. 5. Hypomagnesemia.  Replace magnesium today 6. Essential hypertension hold hydrochlorothiazide can restart lisinopril this evening 7. Hyperlipidemia unspecified continue Crestor        Code Status:     Code Status Orders  (From admission, onward)         Start     Ordered   04/13/21 0143  Full code  Continuous        04/13/21 0142        Code Status History    Date Active Date Inactive Code Status Order ID Comments User Context   01/29/2015 1152 01/30/2015 1352 Full Code [id number]  Hosie Spangle, MD Inpatient   08/11/2012 1143 08/12/2012 1323 Full Code [id number]  Allred, Royston Bake, RN Inpatient   Advance Care Planning Activity     Family Communication: Spoke with wife at the bedside Disposition Plan: Status is: Inpatient  Dispo: The patient is from: Home              Anticipated d/c is to: Home               Patient currently being treated for acute vertigo.  Today unable to lift head off the pillow.   Difficult to place patient.  No.  Antibiotics:  Augmentin  Time spent: 27 minutes  [place]

## 2021-04-14 ENCOUNTER — Telehealth: Payer: Self-pay | Admitting: Family Medicine

## 2021-04-14 DIAGNOSIS — J0100 Acute maxillary sinusitis, unspecified: Secondary | ICD-10-CM | POA: Diagnosis not present

## 2021-04-14 DIAGNOSIS — R110 Nausea: Secondary | ICD-10-CM

## 2021-04-14 DIAGNOSIS — H8113 Benign paroxysmal vertigo, bilateral: Secondary | ICD-10-CM | POA: Diagnosis not present

## 2021-04-14 DIAGNOSIS — E876 Hypokalemia: Secondary | ICD-10-CM | POA: Diagnosis not present

## 2021-04-14 MED ORDER — LISINOPRIL 20 MG PO TABS
40.0000 mg | ORAL_TABLET | Freq: Every day | ORAL | Status: DC
  Administered 2021-04-14: 23:00:00 40 mg via ORAL
  Filled 2021-04-14: qty 2

## 2021-04-14 MED ORDER — ENOXAPARIN SODIUM 60 MG/0.6ML IJ SOSY
0.5000 mg/kg | PREFILLED_SYRINGE | INTRAMUSCULAR | Status: DC
  Administered 2021-04-14: 10:00:00 52.5 mg via SUBCUTANEOUS
  Filled 2021-04-14 (×2): qty 0.6

## 2021-04-14 NOTE — Evaluation (Addendum)
Physical Therapy Evaluation Patient Details Name: Ronald Hunter MRN: [id number] DOB: 08/07/1951 Today's Date: 04/14/2021   History of Present Illness  Ronald Hunter is a 76yoM c PMH: HTN, HLD who presented to the ED with sunnden insidious intractible nausea, vomitting and dizziness 4/27. Pt denies any LOC, denies any recent falls or head tramua, denies any prior similar epsidos. Pt was finishing up some yardwork when this occured. Pt reports having prior intermittent transiet orthostatic dizziness whence rising to standing too quickly, but these episodes are unsimilar to current episode. PMH HTN, back surgeries; fully independent at baseline without device, no balance impairment or falls history.  Clinical Impression  Pt admitted with above diagnosis. Pt currently with functional limitations due to the deficits listed below (see "PT Problem List"). Upon entry, pt in bed, awake and cautiously agreeable to participate. The pt is alert, pleasant, interactive, and able to provide info regarding prior level of function, both in tolerance and independence. Pt is still mildly dizzy, guarded and apprehensive with head movement, tolerating brief periods of eyes open. Pt has been receiving meclizine and ondansetron for symptoms. Pt assessed for orthostatic vital signs, appropriate changes in BP, curiously blunted HR response, unclear if currently on BB therapy. Pt only has mild symptoms exacerbation with return rom EOB to recliner in bed.   Pt tested for horizontal canal BPPV with Horizontal Roll Test: both sides with resultant latent onset (5-10 seconds) left pure horizontal beating (geotropic) nystagmus, which resolves within 60 seconds when testing Left, but persists minimally when testing right (ageotropic). Pt's symptoms are concurrent with nystagmus, but much less severe than initial presentation.  Pt tested posterior canal BPPV with Marye Round Test which was negative on the Right, positive on the left with  latent onset (5-10 sec) up-beating, Left torsional nystagmus. Pt treated for Left sided posterior canal BPPV using the Epley maneuver x1. Will defer potential horizontal canal treatment to later session as needed. In general pt's initial presentation is inconsistent with BPPV realted vertigo as BPPV requires head movements as a trigger with episodic symptoms of less than 60sec per bout. Pt reporting continuous vertigo initially at home, not tolerating use of eyes, unable to achieve a position of comfort. The positive findings of BPPV as above do not rule out other concurrent etiology for vertigo. It is also known that medications like meclizine can impair symptoms response and nystagmus to a variable degree. Will reassess patient next date and treat as needed.   Patient's performance this date reveals decreased ability, independence, and tolerance in performing all basic mobility required for performance of activities of daily living. Pt requires additional DME, close physical assistance, and cues for safe participate in mobility. Pt will benefit from skilled PT intervention to increase independence and safety with basic mobility in preparation for discharge to the venue listed below.       04/14/21 1020  Therapy Vitals  Patient Position (if appropriate) Orthostatic Vitals (No frank aggravation of symptoms with testing)  Orthostatic Lying   BP- Lying 161/87  Pulse- Lying 68  Orthostatic Sitting  BP- Sitting (!) 176/92  Pulse- Sitting 70  Orthostatic Standing at 0 minutes  BP- Standing at 0 minutes 178/90  Pulse- Standing at 0 minutes 70      Follow Up Recommendations Supervision for mobility/OOB;Outpatient PT;Other (comment) (ENT; OPPT for vestibular rehab [facility] main))    Equipment Recommendations  None recommended by PT    Recommendations for Other Services       Precautions /  Restrictions Precautions Precautions: Fall Restrictions Weight Bearing Restrictions: No       Mobility  Bed Mobility Overal bed mobility: Modified Independent                  Transfers Overall transfer level: Modified independent                  Ambulation/Gait Ambulation/Gait assistance:  (deferred at this time)              Stairs            Wheelchair Mobility    Modified Rankin (Stroke Patients Only)       Balance Overall balance assessment: Independent                                           Pertinent Vitals/Pain Pain Assessment: No/denies pain    Home Living Family/patient expects to be discharged to:: Private residence Living Arrangements: Spouse/significant other Available Help at Discharge: Family Type of Home: House Home Access: Stairs to enter Entrance Stairs-Rails: Psychiatric nurse of Steps: 2 Home Layout: One level Home Equipment: None;Crutches;Walker - 4 wheels;Cane - single point      Prior Function Level of Independence: Independent               Hand Dominance        Extremity/Trunk Assessment   Upper Extremity Assessment Upper Extremity Assessment: Overall WFL for tasks assessed    Lower Extremity Assessment Lower Extremity Assessment: Overall WFL for tasks assessed       Communication      Cognition Arousal/Alertness: Awake/alert Behavior During Therapy: WFL for tasks assessed/performed Overall Cognitive Status: Within Functional Limits for tasks assessed                                        General Comments      Exercises     Assessment/Plan    PT Assessment Patient needs continued PT services  PT Problem List Decreased activity tolerance;Decreased balance;Decreased mobility       PT Treatment Interventions Functional mobility training;Therapeutic activities;Therapeutic exercise;Gait training;DME instruction;Balance training;Neuromuscular re-education;Patient/family education;Wheelchair mobility training;Cognitive  remediation;Manual techniques    PT Goals (Current goals can be found in the Care Plan section)  Acute Rehab PT Goals Patient Stated Goal: return to baseline tolerance of head/eye movement/use PT Goal Formulation: With patient Time For Goal Achievement: 04/28/21 Potential to Achieve Goals: Good    Frequency 7X/week   Barriers to discharge        Co-evaluation               AM-PAC PT "6 Clicks" Mobility  Outcome Measure Help needed turning from your back to your side while in a flat bed without using bedrails?: None Help needed moving from lying on your back to sitting on the side of a flat bed without using bedrails?: None Help needed moving to and from a bed to a chair (including a wheelchair)?: None Help needed standing up from a chair using your arms (e.g., wheelchair or bedside chair)?: None Help needed to walk in hospital room?: A Little Help needed climbing 3-5 steps with a railing? : A Little 6 Click Score: 22    End of Session   Activity Tolerance: Patient tolerated treatment well  Patient left: in bed;with family/visitor present;with call bell/phone within reach;with nursing/sitter in room   PT Visit Diagnosis: Difficulty in walking, not elsewhere classified (R26.2);Unsteadiness on feet (R26.81);Other symptoms and signs involving the nervous system (R29.898);Dizziness and giddiness (R42)    Time: 7342-8768 PT Time Calculation (min) (ACUTE ONLY): 42 min   Charges:   PT Evaluation $PT Eval Moderate Complexity: 1 Mod PT Treatments $Neuromuscular Re-education: 8-22 mins $Canalith Rep Proc: 8-22 mins        12:28 PM, 04/14/21 Etta Grandchild, PT, DPT Physical Therapist - [facility]  [phone] ([place])    [place] 04/14/2021, 12:13 PM

## 2021-04-14 NOTE — Progress Notes (Signed)
OT Cancellation Note  Patient Details Name: Ronald Hunter MRN: [id number] DOB: June 18, 1951   Cancelled Treatment:    Reason Eval/Treat Not Completed: Other (comment). On 1st attempt this am, spoke with PT who just finished working with pt and recommending hold OT evaluation at this time 2/2 dizziness and to allow pt time to adjust after PT addressed BPPV. Will re-attempt OT evaluation at later date/time as available and appropriate.   Hanley Hays, MPH, MS, OTR/L ascom [phone] 04/14/21, 4:34 PM

## 2021-04-14 NOTE — Progress Notes (Signed)
Patient ID: Ronald Hunter, male   DOB: 02-20-1951, 70 y.o.   MRN: [id number] [facility] PROGRESS NOTE  Ronald Hunter [id number] DOB: 04-07-1951 DOA: 04/12/2021 PCP: Leone Haven, MD  HPI/Subjective: Patient still not feeling well.  Still has some nausea.  Still has poor appetite.  No vomiting since yesterday.  Still dizzy.  Still with room spinning.  Feels better than yesterday but still not well.  Objective: Vitals:   04/14/21 1146 04/14/21 1510  BP: 126/67 140/82  Pulse: (!) 59 (!) 58  Resp: 18 16  Temp: 97.6 F (36.4 C) 98.3 F (36.8 C)  SpO2: 97% 95%    Intake/Output Summary (Last 24 hours) at 04/14/2021 1510 Last data filed at 04/14/2021 1014 Gross per 24 hour  Intake 1257.5 ml  Output 975 ml  Net 282.5 ml   Filed Weights   04/12/21 1805  Weight: 105 kg    ROS: Review of Systems  Respiratory: Negative for shortness of breath.   Cardiovascular: Negative for chest pain.  Gastrointestinal: Negative for abdominal pain, nausea and vomiting.  Neurological: Positive for dizziness.   Exam: Physical Exam HENT:     Head: Normocephalic.     Mouth/Throat:     Pharynx: No oropharyngeal exudate.  Eyes:     General: Lids are normal.     Extraocular Movements: Extraocular movements intact.     Right eye: Nystagmus present.     Left eye: Nystagmus present.     Conjunctiva/sclera: Conjunctivae normal.  Cardiovascular:     Rate and Rhythm: Normal rate and regular rhythm.     Heart sounds: Normal heart sounds, S1 normal and S2 normal.  Pulmonary:     Breath sounds: Normal breath sounds. No decreased breath sounds, wheezing or rhonchi.  Abdominal:     Palpations: Abdomen is soft.     Tenderness: There is no abdominal tenderness.  Musculoskeletal:     Right ankle: No swelling.     Left ankle: No swelling.  Skin:    General: Skin is warm.     Findings: No rash.  Neurological:     Mental Status: He is alert and oriented to person, place, and time.        Data Reviewed: Basic Metabolic Panel: Recent Labs  Lab 04/12/21 1805 04/12/21 1942 04/13/21 0223 04/13/21 0347  NA 139  --   --  137  K 2.3*  --  4.2 4.3  CL 105  --   --  105  CO2 20*  --   --  25  GLUCOSE 145*  --   --  132*  BUN 16  --   --  12  CREATININE 0.70  --   --  0.48*  CALCIUM 9.5  --   --  8.1*  MG  --  1.9  --  1.6*  PHOS  --  3.9  --  3.8   Liver Function Tests: Recent Labs  Lab 04/12/21 1805 04/13/21 0347  AST 34 26  ALT 33 28  ALKPHOS 66 56  BILITOT 0.9 0.8  PROT 7.4 6.1*  ALBUMIN 4.5 3.6   Recent Labs  Lab 04/12/21 1806  LIPASE 28   CBC: Recent Labs  Lab 04/12/21 1805 04/13/21 0347  WBC 9.9 6.3  NEUTROABS 3.9 5.0  HGB 14.7 13.3  HCT 42.2 37.6*  MCV 87.0 87.9  PLT 205 167   Cardiac Enzymes: Recent Labs  Lab 04/12/21 1942  CKTOTAL 212    Recent Results (from the  past 240 hour(s))  Resp Panel by RT-PCR (Flu A&B, Covid) Nasopharyngeal Swab     Status: None   Collection Time: 04/12/21  7:42 PM   Specimen: Nasopharyngeal Swab; Nasopharyngeal(NP) swabs in vial transport medium  Result Value Ref Range Status   SARS Coronavirus 2 by RT PCR NEGATIVE NEGATIVE Final    Comment: (NOTE) SARS-CoV-2 target nucleic acids are NOT DETECTED.  The SARS-CoV-2 RNA is generally detectable in upper respiratory specimens during the acute phase of infection. The lowest concentration of SARS-CoV-2 viral copies this assay can detect is 138 copies/mL. A negative result does not preclude SARS-Cov-2 infection and should not be used as the sole basis for treatment or other patient management decisions. A negative result may occur with  improper specimen collection/handling, submission of specimen other than nasopharyngeal swab, presence of viral mutation(s) within the areas targeted by this assay, and inadequate number of viral copies(<138 copies/mL). A negative result must be combined with clinical observations, patient history, and  epidemiological information. The expected result is Negative.  Fact Sheet for Patients:  EntrepreneurPulse.com.au  Fact Sheet for Healthcare Providers:  IncredibleEmployment.be  This test is no t yet approved or cleared by the [place] FDA and  has been authorized for detection and/or diagnosis of SARS-CoV-2 by FDA under an Emergency Use Authorization (EUA). This EUA will remain  in effect (meaning this test can be used) for the duration of the COVID-19 declaration under Section 564(b)(1) of the Act, 21 U.S.C.section 360bbb-3(b)(1), unless the authorization is terminated  or revoked sooner.       Influenza A by PCR NEGATIVE NEGATIVE Final   Influenza B by PCR NEGATIVE NEGATIVE Final    Comment: (NOTE) The Xpert Xpress SARS-CoV-2/FLU/RSV plus assay is intended as an aid in the diagnosis of influenza from Nasopharyngeal swab specimens and should not be used as a sole basis for treatment. Nasal washings and aspirates are unacceptable for Xpert Xpress SARS-CoV-2/FLU/RSV testing.  Fact Sheet for Patients: EntrepreneurPulse.com.au  Fact Sheet for Healthcare Providers: IncredibleEmployment.be  This test is not yet approved or cleared by the [place] FDA and has been authorized for detection and/or diagnosis of SARS-CoV-2 by FDA under an Emergency Use Authorization (EUA). This EUA will remain in effect (meaning this test can be used) for the duration of the COVID-19 declaration under Section 564(b)(1) of the Act, 21 U.S.C. section 360bbb-3(b)(1), unless the authorization is terminated or revoked.  Performed at [facility], [address] [place], [place] [address]      Studies: DG Chest 2 View  Result Date: 04/12/2021 CLINICAL DATA:  Nausea and dizziness x1 day EXAM: CHEST - 2 VIEW COMPARISON:  April 08, 2012 FINDINGS: The heart size and mediastinal contours are within normal limits.  Low lung volumes with bibasilar atelectasis. No pleural effusion. No visible pneumothorax. Spondylosis. IMPRESSION: Low lung volumes with bibasilar atelectasis. Electronically Signed   By: Dahlia Bailiff MD   On: 04/12/2021 19:49   DG Abdomen 1 View  Result Date: 04/12/2021 CLINICAL DATA:  Nausea and vomiting. EXAM: ABDOMEN - 1 VIEW COMPARISON:  None. FINDINGS: No bowel dilatation to suggest obstruction. Small volume of colonic stool. No radiopaque calculi or abnormal soft tissue calcifications. No suspicious intra-abdominal mass effect no acute osseous abnormalities are seen. IMPRESSION: Unremarkable radiograph of the abdomen. Electronically Signed   By: Keith Rake M.D.   On: 04/12/2021 23:43   CT Head Wo Contrast  Result Date: 04/12/2021 CLINICAL DATA:  Nonspecific dizziness.  Nausea. EXAM: CT HEAD  WITHOUT CONTRAST TECHNIQUE: Contiguous axial images were obtained from the base of the skull through the vertex without intravenous contrast. COMPARISON:  None. FINDINGS: Brain: No intracranial hemorrhage, mass effect, or midline shift. No hydrocephalus. The basilar cisterns are patent. No evidence of territorial infarct or acute ischemia. No extra-axial or intracranial fluid collection. Vascular: No hyperdense vessel. Skull: No fracture or focal lesion. Sinuses/Orbits: No mastoid effusion. Opacification of the right maxillary sinus with remote postsurgical change. Scattered ethmoid air cell mucosal thickening. Unremarkable orbits. Other: None. IMPRESSION: 1. No acute intracranial abnormality. 2. Chronic right maxillary sinusitis. Electronically Signed   By: Keith Rake M.D.   On: 04/12/2021 19:40   MR BRAIN WO CONTRAST  Result Date: 04/13/2021 CLINICAL DATA:  Initial evaluation for acute vertigo. EXAM: MRI HEAD WITHOUT CONTRAST TECHNIQUE: Multiplanar, multiecho pulse sequences of the brain and surrounding structures were obtained without intravenous contrast. COMPARISON:  Prior CTA from earlier  the same day. FINDINGS: Brain: Cerebral volume within normal limits. Patchy T2/FLAIR hyperintensity within the periventricular deep white matter both cerebral hemispheres most consistent with chronic small vessel ischemic disease, mild in nature. No abnormal foci of restricted diffusion to suggest acute or subacute ischemia. Gray-white matter differentiation maintained. No encephalomalacia to suggest chronic cortical infarction. No evidence for acute or chronic intracranial hemorrhage. No mass lesion, midline shift or mass effect. No hydrocephalus or extra-axial fluid collection. Pituitary gland suprasellar region normal. Midline structures intact. Vascular: Major intracranial vascular flow voids are maintained. Skull and upper cervical spine: Craniocervical junction normal. Bone marrow signal intensity normal. No scalp soft tissue abnormality. Sinuses/Orbits: Globes and orbital soft tissues within normal limits. Chronic right worse than left maxillary sinusitis. No mastoid effusion. Inner ear structures grossly normal. Other: None. IMPRESSION: 1. No acute intracranial abnormality. 2. Mild chronic microvascular ischemic disease for age. 3. Chronic right worse than left maxillary sinusitis. Electronically Signed   By: Jeannine Boga M.D.   On: 04/13/2021 02:22   ECHOCARDIOGRAM COMPLETE  Result Date: 04/13/2021    ECHOCARDIOGRAM REPORT   Patient Name:   Ronald Hunter Date of Exam: 04/13/2021 Medical Rec #:  [id number]   Height:       73.0 in Accession #:    [id number]  Weight:       231.5 lb Date of Birth:  1951-05-23   BSA:          2.290 m Patient Age:    4 years    BP:           157/71 mmHg Patient Gender: M           HR:           77 bpm. Exam Location:  [facility] Procedure: 2D Echo, Color Doppler, Cardiac Doppler and Intracardiac            Opacification Agent Indications:     G45.9 TIA  History:         Patient has no prior history of Echocardiogram examinations.                   Signs/Symptoms:Dizziness/Lightheadedness; Risk                  Factors:Hypertension and Dyslipidemia.  Sonographer:     Charmayne Sheer RDCS (AE) Referring Phys:  Geryl Rankins DOUTOVA Diagnosing Phys: Ida Rogue MD  Sonographer Comments: Suboptimal apical window and suboptimal subcostal window. Image acquisition challenging due to patient body habitus. IMPRESSIONS  1. Left ventricular ejection fraction, by estimation, is 60  to 65%. The left ventricle has normal function. The left ventricle has no regional wall motion abnormalities. Left ventricular diastolic parameters are consistent with Grade I diastolic dysfunction (impaired relaxation).  2. Right ventricular systolic function is normal. The right ventricular size is normal.  3. Left atrial size was mildly dilated.  4. The mitral valve is normal in structure. No evidence of mitral valve regurgitation. No evidence of mitral stenosis. FINDINGS  Left Ventricle: Left ventricular ejection fraction, by estimation, is 60 to 65%. The left ventricle has normal function. The left ventricle has no regional wall motion abnormalities. Definity contrast agent was given IV to delineate the left ventricular  endocardial borders. The left ventricular internal cavity size was normal in size. There is no left ventricular hypertrophy. Left ventricular diastolic parameters are consistent with Grade I diastolic dysfunction (impaired relaxation). Right Ventricle: The right ventricular size is normal. No increase in right ventricular wall thickness. Right ventricular systolic function is normal. Left Atrium: Left atrial size was mildly dilated. Right Atrium: Right atrial size was normal in size. Pericardium: There is no evidence of pericardial effusion. Mitral Valve: The mitral valve is normal in structure. No evidence of mitral valve regurgitation. No evidence of mitral valve stenosis. MV peak gradient, 3.1 mmHg. The mean mitral valve gradient is 2.0 mmHg. Tricuspid Valve: The  tricuspid valve is normal in structure. Tricuspid valve regurgitation is not demonstrated. No evidence of tricuspid stenosis. Aortic Valve: The aortic valve is normal in structure. Aortic valve regurgitation is not visualized. No aortic stenosis is present. Aortic valve mean gradient measures 6.0 mmHg. Aortic valve peak gradient measures 11.8 mmHg. Aortic valve area, by VTI measures 1.96 cm. Pulmonic Valve: The pulmonic valve was normal in structure. Pulmonic valve regurgitation is mild. No evidence of pulmonic stenosis. Aorta: The aortic root is normal in size and structure. Venous: The inferior vena cava is normal in size with greater than 50% respiratory variability, suggesting right atrial pressure of 3 mmHg. IAS/Shunts: No atrial level shunt detected by color flow Doppler.  LEFT VENTRICLE PLAX 2D LVIDd:         4.40 cm  Diastology LVIDs:         2.50 cm  LV e' medial:    8.92 cm/s LV PW:         1.10 cm  LV E/e' medial:  9.1 LV IVS:        1.00 cm  LV e' lateral:   10.40 cm/s LVOT diam:     2.10 cm  LV E/e' lateral: 7.8 LV SV:         62 LV SV Index:   27 LVOT Area:     3.46 cm  LEFT ATRIUM         Index LA diam:    4.20 cm 1.83 cm/m  AORTIC VALVE                    PULMONIC VALVE AV Area (Vmax):    1.89 cm     PV Vmax:       1.31 m/s AV Area (Vmean):   1.73 cm     PV Vmean:      67.600 cm/s AV Area (VTI):     1.96 cm     PV VTI:        0.203 m AV Vmax:           172.00 cm/s  PV Peak grad:  6.9 mmHg AV Vmean:  115.000 cm/s PV Mean grad:  2.0 mmHg AV VTI:            0.317 m AV Peak Grad:      11.8 mmHg AV Mean Grad:      6.0 mmHg LVOT Vmax:         94.00 cm/s LVOT Vmean:        57.500 cm/s LVOT VTI:          0.179 m LVOT/AV VTI ratio: 0.56  AORTA Ao Root diam: 3.80 cm MITRAL VALVE               TRICUSPID VALVE MV Area (PHT): 3.58 cm    TR Peak grad:   26.8 mmHg MV Area VTI:   2.54 cm    TR Vmax:        259.00 cm/s MV Peak grad:  3.1 mmHg MV Mean grad:  2.0 mmHg    SHUNTS MV Vmax:       0.88 m/s     Systemic VTI:  0.18 m MV Vmean:      63.0 cm/s   Systemic Diam: 2.10 cm MV Decel Time: 212 msec MV E velocity: 81.00 cm/s MV A velocity: 79.70 cm/s MV E/A ratio:  1.02 Ida Rogue MD Electronically signed by Ida Rogue MD Signature Date/Time: 04/13/2021/2:09:41 PM    Final    CT ANGIO HEAD NECK W WO CM (CODE STROKE)  Result Date: 04/13/2021 CLINICAL DATA:  Initial evaluation for acute dizziness, vertigo, vomiting. EXAM: CT ANGIOGRAPHY HEAD AND NECK TECHNIQUE: Multidetector CT imaging of the head and neck was performed using the standard protocol during bolus administration of intravenous contrast. Multiplanar CT image reconstructions and MIPs were obtained to evaluate the vascular anatomy. Carotid stenosis measurements (when applicable) are obtained utilizing NASCET criteria, using the distal internal carotid diameter as the denominator. CONTRAST:  36mL OMNIPAQUE IOHEXOL 350 MG/ML SOLN COMPARISON:  Prior head CT from earlier the same day. FINDINGS: CTA NECK FINDINGS Aortic arch: Visualized aortic arch normal in caliber with normal branch pattern. No hemodynamically significant stenosis seen about the origin of the great vessels. Right carotid system: Right common carotid artery patent from its origin to the bifurcation without stenosis. Mild for age atheromatous plaque about the right carotid bulb without significant stenosis. Right ICA widely patent distally without stenosis, dissection or occlusion. Left carotid system: Left common carotid artery widely patent from its origin to the bifurcation without stenosis. Eccentric mixed plaque at the left carotid bulb without hemodynamically significant stenosis. Left ICA widely patent distally without stenosis, dissection or occlusion. Vertebral arteries: Both vertebral arteries arise from the subclavian arteries. No proximal subclavian artery stenosis. Atheromatous plaque at the origins of both vertebral arteries with no more than mild stenosis. Vertebral  arteries otherwise patent distally without stenosis, dissection or occlusion. Skeleton: No visible acute osseous abnormality. No discrete or worrisome osseous lesions. Moderate cervical spondylosis present at C5-6 and C6-7 without high-grade spinal stenosis. Other neck: No other acute soft tissue abnormality within the neck. No mass or adenopathy. Chronic maxillary sinusitis noted, right worse than left. Upper chest: Visualized upper chest demonstrates no acute finding. Mild atelectatic changes noted within the visualized lungs. Review of the MIP images confirms the above findings CTA HEAD FINDINGS Anterior circulation: Petrous segments patent bilaterally. Mild atheromatous change within the carotid siphons without hemodynamically significant stenosis. A1 segments widely patent. Normal anterior communicating artery complex. Anterior cerebral arteries patent to their distal aspects without stenosis. No M1 stenosis or occlusion. Normal MCA  bifurcations. Distal MCA branches well perfused and symmetric. Posterior circulation: Both V4 segments patent to the vertebrobasilar junction without stenosis. Left vertebral artery slightly dominant. Both PICA origins patent and normal. Basilar patent to its distal aspect without stenosis. Superior cerebellar arteries patent bilaterally. Right PCA supplied via the basilar. Fetal type origin of the left PCA. Both PCAs perfused to their distal aspects without stenosis. Venous sinuses: Grossly patent allowing for timing the contrast bolus. Anatomic variants: Fetal type origin of the left PCA. No intracranial aneurysm. Review of the MIP images confirms the above findings IMPRESSION: 1. Negative CTA for large vessel occlusion. 2. Mild for age atheromatous change about the carotid bifurcations and carotid siphons without hemodynamically significant stenosis. 3. Chronic maxillary sinusitis, right worse than left. Findings communicated by telephone at the time of interpretation on  04/13/2021 at 1:40 am to provider Dr. Mitzi Hansen. Electronically Signed   By: Jeannine Boga M.D.   On: 04/13/2021 01:48    Scheduled Meds: .  stroke: mapping our early stages of recovery book   Does not apply Once  . amoxicillin-clavulanate  1 tablet Oral Q12H  . enoxaparin (LOVENOX) injection  0.5 mg/kg Subcutaneous Q24H  . lisinopril  40 mg Oral QHS  . meclizine  25 mg Oral TID  . ondansetron (ZOFRAN) IV  4 mg Intravenous Q6H  . rosuvastatin  40 mg Oral Daily   Continuous Infusions: . sodium chloride 50 mL/hr at 04/13/21 0739    Assessment/Plan:  1. Benign positional vertigo.  MRI of the brain negative.  Continue meclizine.  Continue standing dose Zofran.  Patient feeling a little bit better today but still not well.  Able to work a little bit with physical therapy.  Continue IV fluid hydration. 2. Nausea.  Standing the Zofran today.  Vomiting has resolved 3. Sinusitis seen on MRI.  Continue Augmentin 4. Hypokalemia on presentation.  Holding hydrochlorothiazide.  Potassium in the normal range. 5. Hypomagnesemia replaced yesterday.  Recheck tomorrow 6. Essential hypertension.  Increase lisinopril to 40 mg and hold hydrochlorothiazide. 7. Hyperlipidemia unspecified continue Crestor.     Code Status:     Code Status Orders  (From admission, onward)         Start     Ordered   04/13/21 0143  Full code  Continuous        04/13/21 0142        Code Status History    Date Active Date Inactive Code Status Order ID Comments User Context   01/29/2015 1152 01/30/2015 1352 Full Code [id number]  Hosie Spangle, MD Inpatient   08/11/2012 1143 08/12/2012 1323 Full Code [id number]  Allred, Royston Bake, RN Inpatient   Advance Care Planning Activity     Family Communication: Wife at bedside Disposition Plan: Status is: Inpatient  Dispo: The patient is from: Home              Anticipated d/c is to: Home in the next day or so depending if he rapidly improves.               Patient currently being treated for vertigo   Difficult to place patient.  No.  Time spent: 27 minutes  [place]

## 2021-04-14 NOTE — Telephone Encounter (Signed)
I called and LVM informing the patient that the provider in the hospital will refill any medication and also when he does his hospital f/up his provider will take care of it .  Mishti Swanton,cma

## 2021-04-14 NOTE — Progress Notes (Signed)
Physical Therapy Treatment Patient Details Name: Ronald Hunter MRN: [id number] DOB: January 03, 1951 Today's Date: 04/14/2021    History of Present Illness Ronald Hunter is a 70yoM c PMH: HTN, HLD who presented to the ED with sunnden insidious intractible nausea, vomitting and dizziness 4/27. Pt denies any LOC, denies any recent falls or head tramua, denies any prior similar epsidos. Pt was finishing up some yardwork when this occured. Pt reports having prior intermittent transiet orthostatic dizziness whence rising to standing too quickly, but these episodes are unsimilar to current episode. PMH HTN, back surgeries; fully independent at baseline without device, no balance impairment or falls history.    PT Comments    Author returned to room for BID session as time permitted. Pt reports being able to sit up at EOB and eat lunch today, which is a big improvement. Pt agreeable to attempt OOB mobility. ModI bed mobility from Rt S/L to sitting, modI STS transfer c RW, supervision level AMB c RW, no LOB. Pt veyr pleased with progress so thus since morning. He maintained eyes opened throughtout session. No vertigo, no N/V.     Follow Up Recommendations  Supervision for mobility/OOB;Outpatient PT;Other (comment)     Equipment Recommendations  None recommended by PT    Recommendations for Other Services       Precautions / Restrictions Precautions Precautions: Fall Restrictions Weight Bearing Restrictions: No    Mobility  Bed Mobility Overal bed mobility: Modified Independent                  Transfers Overall transfer level: Modified independent                  Ambulation/Gait Ambulation/Gait assistance: Supervision Gait Distance (Feet): 200 Feet Assistive device: Rolling walker (2 wheeled)       General Gait Details: tolerated well in general; slow guarded; guarded head movements, mild dizziness when turning 180 degrees   Stairs             Wheelchair Mobility     Modified Rankin (Stroke Patients Only)       Balance Overall balance assessment: Modified Independent                                          Cognition Arousal/Alertness: Awake/alert Behavior During Therapy: WFL for tasks assessed/performed Overall Cognitive Status: Within Functional Limits for tasks assessed                                        Exercises      General Comments        Pertinent Vitals/Pain Pain Assessment: No/denies pain    Home Living                      Prior Function            PT Goals (current goals can now be found in the care plan section) Acute Rehab PT Goals Patient Stated Goal: return to baseline tolerance of head/eye movement/use PT Goal Formulation: With patient Time For Goal Achievement: 04/28/21 Potential to Achieve Goals: Good Progress towards PT goals: Progressing toward goals    Frequency    7X/week      PT Plan Current plan remains appropriate    Co-evaluation  AM-PAC PT "6 Clicks" Mobility   Outcome Measure  Help needed turning from your back to your side while in a flat bed without using bedrails?: None Help needed moving from lying on your back to sitting on the side of a flat bed without using bedrails?: None Help needed moving to and from a bed to a chair (including a wheelchair)?: None Help needed standing up from a chair using your arms (e.g., wheelchair or bedside chair)?: None Help needed to walk in hospital room?: A Little Help needed climbing 3-5 steps with a railing? : A Little 6 Click Score: 22    End of Session Equipment Utilized During Treatment: Gait belt Activity Tolerance: Patient tolerated treatment well Patient left: in bed;with family/visitor present;with call bell/phone within reach;with nursing/sitter in room Nurse Communication: Mobility status PT Visit Diagnosis: Difficulty in walking, not elsewhere classified  (R26.2);Unsteadiness on feet (R26.81);Other symptoms and signs involving the nervous system (R29.898);Dizziness and giddiness (R42)     Time: 1525-1540 PT Time Calculation (min) (ACUTE ONLY): 15 min  Charges:  $Neuromuscular Re-education: 8-22 mins $Canalith Rep Proc: 8-22 mins                     3:56 PM, 04/14/21 Ronald Hunter, PT, DPT Physical Therapist - [facility]  [phone] (Altha)     Grygla C 04/14/2021, 3:54 PM

## 2021-04-14 NOTE — Progress Notes (Signed)
   04/14/21 1020  Therapy Vitals  Patient Position (if appropriate) Orthostatic Vitals (No frank aggravation of symptoms with testing)  Orthostatic Lying   BP- Lying 161/87  Pulse- Lying 68  Orthostatic Sitting  BP- Sitting (!) 176/92  Pulse- Sitting 70  Orthostatic Standing at 0 minutes  BP- Standing at 0 minutes 178/90  Pulse- Standing at 0 minutes 70    Orthostatic vitals taken during PT evaluation.  12:03 PM, 04/14/21 Etta Grandchild, PT, DPT Physical Therapist - [facility]  [phone] [facility])

## 2021-04-14 NOTE — Progress Notes (Signed)
NT attempted to get orthostatic VS, pt refused. He states, "I'm finally starting to feel better. I get sick when I sit up."

## 2021-04-14 NOTE — Telephone Encounter (Signed)
PT wife called in to inform that husband is in the hospital atm with vertigo and wanted to know if Dr.Sonnenberg could look and see if any of his meds needed to be refill.

## 2021-04-14 NOTE — Plan of Care (Signed)
End of Shift Summary:  Alert and oriented x4. VSS. Unable to get shift orthostatic VS due to pt intolerance. Remained on room air, sats >95%. Denies pain. Nausea managed with prn medications. No episodes of emesis this shift. Urine output adequate via urinal. IVF maintained. Remained free from falls or injury. Bed low and in locked position. Call bell within reach and able to use.   Problem: Education: Goal: Knowledge of General Education information will improve Description: Including pain rating scale, medication(s)/side effects and non-pharmacologic comfort measures Outcome: Progressing   Problem: Health Behavior/Discharge Planning: Goal: Ability to manage health-related needs will improve Outcome: Progressing   Problem: Clinical Measurements: Goal: Ability to maintain clinical measurements within normal limits will improve Outcome: Progressing Goal: Will remain free from infection Outcome: Progressing Goal: Diagnostic test results will improve Outcome: Progressing Goal: Respiratory complications will improve Outcome: Progressing Goal: Cardiovascular complication will be avoided Outcome: Progressing   Problem: Activity: Goal: Risk for activity intolerance will decrease Outcome: Progressing   Problem: Nutrition: Goal: Adequate nutrition will be maintained Outcome: Progressing   Problem: Elimination: Goal: Will not experience complications related to bowel motility Outcome: Progressing Goal: Will not experience complications related to urinary retention Outcome: Progressing   Problem: Pain Managment: Goal: General experience of comfort will improve Outcome: Progressing   Problem: Skin Integrity: Goal: Risk for impaired skin integrity will decrease Outcome: Progressing   Problem: Safety: Goal: Ability to remain free from injury will improve Outcome: Progressing

## 2021-04-15 DIAGNOSIS — E876 Hypokalemia: Secondary | ICD-10-CM | POA: Diagnosis not present

## 2021-04-15 DIAGNOSIS — R110 Nausea: Secondary | ICD-10-CM | POA: Diagnosis not present

## 2021-04-15 DIAGNOSIS — H8113 Benign paroxysmal vertigo, bilateral: Secondary | ICD-10-CM | POA: Diagnosis not present

## 2021-04-15 DIAGNOSIS — J0100 Acute maxillary sinusitis, unspecified: Secondary | ICD-10-CM | POA: Diagnosis not present

## 2021-04-15 LAB — BASIC METABOLIC PANEL
Anion gap: 8 (ref 5–15)
BUN: 10 mg/dL (ref 8–23)
CO2: 23 mmol/L (ref 22–32)
Calcium: 8.1 mg/dL — ABNORMAL LOW (ref 8.9–10.3)
Chloride: 108 mmol/L (ref 98–111)
Creatinine, Ser: 0.79 mg/dL (ref 0.61–1.24)
GFR, Estimated: >60 mL/min (ref >60)
Glucose, Bld: 81 mg/dL (ref 70–99)
Potassium: 3.6 mmol/L (ref 3.5–5.1)
Sodium: 139 mmol/L (ref 135–145)

## 2021-04-15 LAB — MAGNESIUM: Magnesium: 2.1 mg/dL (ref 1.7–2.4)

## 2021-04-15 MED ORDER — MECLIZINE HCL 25 MG PO TABS: 25.0000 mg | ORAL_TABLET | Freq: Three times a day (TID) | ORAL | 0 refills | Status: DC | PRN

## 2021-04-15 MED ORDER — ONDANSETRON HCL 4 MG PO TABS: 4.0000 mg | ORAL_TABLET | Freq: Three times a day (TID) | ORAL | 0 refills | Status: DC | PRN

## 2021-04-15 MED ORDER — AMOXICILLIN-POT CLAVULANATE 875-125 MG PO TABS: 1.0000 | ORAL_TABLET | Freq: Two times a day (BID) | ORAL | 0 refills | Status: AC

## 2021-04-15 MED ORDER — LISINOPRIL 20 MG PO TABS: 40.0000 mg | ORAL_TABLET | Freq: Every day | ORAL | 0 refills | Status: DC

## 2021-04-15 NOTE — Discharge Summary (Signed)
[facility] [place] NAME: Ronald Hunter    MR#:  [id number]  DATE OF BIRTH:  10-Aug-1951  DATE OF ADMISSION:  04/12/2021 ADMITTING PHYSICIAN: Loletha Grayer, MD  DATE OF DISCHARGE: 04/15/2021 12:20 PM  PRIMARY CARE PHYSICIAN: Leone Haven, MD    ADMISSION DIAGNOSIS:  Dizziness [R42] Intractable nausea and vomiting [R11.2] Vertigo [R42]  DISCHARGE DIAGNOSIS:  Active Problems:   Essential hypertension   Hyperlipidemia   Intractable nausea and vomiting   Central vestibular vertigo   Hypokalemia   Vertigo   Benign paroxysmal positional vertigo due to bilateral vestibular disorder   Subacute maxillary sinusitis   Nausea   Hypomagnesemia   SECONDARY DIAGNOSIS:   Past Medical History:  Diagnosis Date  . Anxiety   . Arthritis   . Chronic back pain    stenosis/spondylosis  . History of bronchitis    as a child  . History of colon polyps   . History of kidney stones   . Hyperlipidemia    takes Crestor daily  . Hypertension    takes Metoprolol daily  . PONV (postoperative nausea and vomiting)    Nausea with one surgery    HOSPITAL COURSE:   1.  Benign positional vertigo.  The patient gradually got better during the hospital course.  Still had some nausea and dizziness with moving his head.  He was able to work with physical therapy and they recommended outpatient follow-up.  I did prescribe standing dose of meclizine during the hospital course and will make as needed but I have advised the patient he will likely need it for a few more days standing dose.  All imaging of the brain negative for stroke including MRI and CT angio of the head and neck.  The patient was given IV fluids during the hospital course.  No driving while having vertigo or taking meclizine.  I did give him the number for ENT and [facility] if vertigo persist. 2.  Persistent nausea.  I did give standing dose IV Zofran during the  hospital course.  Vomiting had resolved.  Prescribed as needed Zofran upon going home. 3.  Sinusitis seen on CT scan and MRI.  I started Augmentin during the hospital course and will finish up a total of 10 days. 4.  Hypokalemia on presentation.  Discontinued hydrochlorothiazide.  Potassium replaced into the normal range. 5.  Hypomagnesemia this was replaced during the hospital course. 6.  Essential hypertension I will increase lisinopril to 40 mg daily since I am holding hydrochlorothiazide. 7.  Hyperlipidemia unspecified.  I continue Crestor during the hospital course  DISCHARGE CONDITIONS:   Satisfactory  CONSULTS OBTAINED:    DRUG ALLERGIES:  No Known Allergies  DISCHARGE MEDICATIONS:   Allergies as of 04/15/2021   No Known Allergies     Medication List    STOP taking these medications   lisinopril-hydrochlorothiazide 20-25 MG tablet Commonly known as: ZESTORETIC     TAKE these medications   amoxicillin-clavulanate 875-125 MG tablet Commonly known as: AUGMENTIN Take 1 tablet by mouth every 12 (twelve) hours for 8 days.   aspirin 81 MG tablet Take 81 mg by mouth daily.   lisinopril 20 MG tablet Commonly known as: ZESTRIL Take 2 tablets (40 mg total) by mouth at bedtime.   meclizine 25 MG tablet Commonly known as: ANTIVERT Take 1 tablet (25 mg total) by mouth 3 (three) times daily as needed for dizziness.   multivitamin with  minerals Tabs tablet Take 1 tablet by mouth daily.   ondansetron 4 MG tablet Commonly known as: Zofran Take 1 tablet (4 mg total) by mouth every 8 (eight) hours as needed for nausea or vomiting.   rosuvastatin 40 MG tablet Commonly known as: CRESTOR Take 1 tablet (40 mg total) by mouth daily.        DISCHARGE INSTRUCTIONS:   Follow-up PMD 5 days  If you experience worsening of your admission symptoms, develop shortness of breath, life threatening emergency, suicidal or homicidal thoughts you must seek medical attention immediately  by calling 911 or calling your MD immediately  if symptoms less severe.  You Must read complete instructions/literature along with all the possible adverse reactions/side effects for all the Medicines you take and that have been prescribed to you. Take any new Medicines after you have completely understood and accept all the possible adverse reactions/side effects.   Please note  You were cared for by a hospitalist during your hospital stay. If you have any questions about your discharge medications or the care you received while you were in the hospital after you are discharged, you can call the unit and asked to speak with the hospitalist on call if the hospitalist that took care of you is not available. Once you are discharged, your primary care physician will handle any further medical issues. Please note that NO REFILLS for any discharge medications will be authorized once you are discharged, as it is imperative that you return to your primary care physician (or establish a relationship with a primary care physician if you do not have one) for your aftercare needs so that they can reassess your need for medications and monitor your lab values.    Today   CHIEF COMPLAINT:   Chief Complaint  Patient presents with  . Nausea    Dizziness, nausea, clammy,     HISTORY OF PRESENT ILLNESS:  Ronald Hunter  is a 70 y.o. male came in not feeling well nausea dizziness.   VITAL SIGNS:  Blood pressure (!) 143/89, pulse (!) 57, temperature 98.5 F (36.9 C), resp. rate 16, height 6\' 1"  (1.854 m), weight 105 kg, SpO2 98 %.  I/O:    Intake/Output Summary (Last 24 hours) at 04/15/2021 1630 Last data filed at 04/15/2021 1035 Gross per 24 hour  Intake 1091.98 ml  Output 1025 ml  Net 66.98 ml    PHYSICAL EXAMINATION:  GENERAL:  70 y.o.-year-old patient lying in the bed with no acute distress.  EYES: Pupils equal, round, reactive to light and accommodation. No scleral icterus. Extraocular muscles  intact with slight horizontal nystagmus but better than on presentation. HEENT: Head atraumatic, normocephalic. Oropharynx and nasopharynx clear.   LUNGS: Normal breath sounds bilaterally, no wheezing, rales,rhonchi or crepitation. No use of accessory muscles of respiration.  CARDIOVASCULAR: S1, S2 normal. No murmurs, rubs, or gallops.  ABDOMEN: Soft, non-tender, non-distended.  EXTREMITIES: No pedal edema.  NEUROLOGIC: Cranial nerves II through XII are intact. Muscle strength 5/5 in all extremities. Sensation intact. Gait not checked.  PSYCHIATRIC: The patient is alert and oriented x 3.  SKIN: No obvious rash, lesion, or ulcer.   DATA REVIEW:   CBC Recent Labs  Lab 04/13/21 0347  WBC 6.3  HGB 13.3  HCT 37.6*  PLT 167    Chemistries  Recent Labs  Lab 04/13/21 0347 04/15/21 0454  NA 137 139  K 4.3 3.6  CL 105 108  CO2 25 23  GLUCOSE 132* 81  BUN  12 10  CREATININE 0.48* 0.79  CALCIUM 8.1* 8.1*  MG 1.6* 2.1  AST 26  --   ALT 28  --   ALKPHOS 56  --   BILITOT 0.8  --     Microbiology Results  Results for orders placed or performed during the hospital encounter of 04/12/21  Resp Panel by RT-PCR (Flu A&B, Covid) Nasopharyngeal Swab     Status: None   Collection Time: 04/12/21  7:42 PM   Specimen: Nasopharyngeal Swab; Nasopharyngeal(NP) swabs in vial transport medium  Result Value Ref Range Status   SARS Coronavirus 2 by RT PCR NEGATIVE NEGATIVE Final    Comment: (NOTE) SARS-CoV-2 target nucleic acids are NOT DETECTED.  The SARS-CoV-2 RNA is generally detectable in upper respiratory specimens during the acute phase of infection. The lowest concentration of SARS-CoV-2 viral copies this assay can detect is 138 copies/mL. A negative result does not preclude SARS-Cov-2 infection and should not be used as the sole basis for treatment or other patient management decisions. A negative result may occur with  improper specimen collection/handling, submission of specimen  other than nasopharyngeal swab, presence of viral mutation(s) within the areas targeted by this assay, and inadequate number of viral copies(<138 copies/mL). A negative result must be combined with clinical observations, patient history, and epidemiological information. The expected result is Negative.  Fact Sheet for Patients:  BloggerCourse.com  Fact Sheet for Healthcare Providers:  SeriousBroker.it  This test is no t yet approved or cleared by the [place] FDA and  has been authorized for detection and/or diagnosis of SARS-CoV-2 by FDA under an Emergency Use Authorization (EUA). This EUA will remain  in effect (meaning this test can be used) for the duration of the COVID-19 declaration under Section 564(b)(1) of the Act, 21 U.S.C.section 360bbb-3(b)(1), unless the authorization is terminated  or revoked sooner.       Influenza A by PCR NEGATIVE NEGATIVE Final   Influenza B by PCR NEGATIVE NEGATIVE Final    Comment: (NOTE) The Xpert Xpress SARS-CoV-2/FLU/RSV plus assay is intended as an aid in the diagnosis of influenza from Nasopharyngeal swab specimens and should not be used as a sole basis for treatment. Nasal washings and aspirates are unacceptable for Xpert Xpress SARS-CoV-2/FLU/RSV testing.  Fact Sheet for Patients: BloggerCourse.com  Fact Sheet for Healthcare Providers: SeriousBroker.it  This test is not yet approved or cleared by the [place] FDA and has been authorized for detection and/or diagnosis of SARS-CoV-2 by FDA under an Emergency Use Authorization (EUA). This EUA will remain in effect (meaning this test can be used) for the duration of the COVID-19 declaration under Section 564(b)(1) of the Act, 21 U.S.C. section 360bbb-3(b)(1), unless the authorization is terminated or revoked.  Performed at [facility], [address]  [place], [place] [address]      Management plans discussed with the patient, family and they are in agreement.  CODE STATUS:     Code Status Orders  (From admission, onward)         Start     Ordered   04/13/21 0143  Full code  Continuous        04/13/21 0142        Code Status History    Date Active Date Inactive Code Status Order ID Comments User Context   01/29/2015 1152 01/30/2015 1352 Full Code [id number]  Hewitt Shorts, MD Inpatient   08/11/2012 1143 08/12/2012 1323 Full Code [id number]  Allred, Truett Mainland, RN Inpatient  Advance Care Planning Activity      TOTAL TIME TAKING CARE OF THIS PATIENT: 35 minutes.    Loletha Grayer M.D on 04/15/2021 at 4:30 PM  Between 7am to 6pm - Pager - [phone]  After 6pm go to [email or web address] - password [email or web address]  [facility]  CC: Primary care physician; Leone Haven, MD

## 2021-04-15 NOTE — TOC Progression Note (Signed)
Transition of Care [facility]) - Progression Note    Patient Details  Name: Ronald Hunter MRN: [id number] Date of Birth: 1951-07-17  Transition of Care [facility] CM/SW [place], [place] Phone Number: 04/15/2021, 10:27 AM  Clinical Narrative:     CSW met with client and his wife to discuss their options for outpatient PT. Client agreed to [facility]. CSW completed form and obtaining signature from doctor before faxing for patient.   Expected Discharge Plan: Home/Self Care Barriers to Discharge: Continued Medical Work up  Expected Discharge Plan and Services Expected Discharge Plan: Home/Self Care   Discharge Planning Services: CM Consult Post Acute Care Choice: NA Living arrangements for the past 2 months: Single Family Home Expected Discharge Date: 04/15/21               DME Arranged:  (n/a)           HH Agency:  (n/a)         Social Determinants of Health (SDOH) Interventions    Readmission Risk Interventions No flowsheet data found.

## 2021-04-15 NOTE — Discharge Instructions (Signed)
No Driving until vertigo completely resolved   Benign Positional Vertigo Vertigo is the feeling that you or your surroundings are moving when they are not. Benign positional vertigo is the most common form of vertigo. This is usually a harmless condition (benign). This condition is positional. This means that symptoms are triggered by certain movements and positions. This condition can be dangerous if it occurs while you are doing something that could cause harm to you or others. This includes activities such as driving or operating machinery. What are the causes? The inner ear has fluid-filled canals that help your brain sense movement and balance. When the fluid moves, the brain receives messages about your body's position. With benign positional vertigo, crystals in the inner ear break free and disturb the inner ear area. This causes your brain to receive confusing messages about your body's position. What increases the risk? You are more likely to develop this condition if:  You are a woman.  You are 17 years of age or older.  You have recently had a head injury.  You have an inner ear disease. What are the signs or symptoms? Symptoms of this condition usually happen when you move your head or your eyes in different directions. Symptoms may start suddenly, and usually last for less than a minute. They include:  Loss of balance and falling.  Feeling like you are spinning or moving.  Feeling like your surroundings are spinning or moving.  Nausea and vomiting.  Blurred vision.  Dizziness.  Involuntary eye movement (nystagmus). Symptoms can be mild and cause only minor problems, or they can be severe and interfere with daily life. Episodes of benign positional vertigo may return (recur) over time. Symptoms may improve over time. How is this diagnosed? This condition may be diagnosed based on:  Your medical history.  Physical exam of the head, neck, and ears.  Positional  tests to check for or stimulate vertigo. You may be asked to turn your head and change positions, such as going from sitting to lying down. A health care provider will watch for symptoms of vertigo. You may be referred to a health care provider who specializes in ear, nose, and throat problems (ENT, or otolaryngologist) or a provider who specializes in disorders of the nervous system (neurologist). How is this treated? This condition may be treated in a session in which your health care provider moves your head in specific positions to help the displaced crystals in your inner ear move. Treatment for this condition may take several sessions. Surgery may be needed in severe cases, but this is rare. In some cases, benign positional vertigo may resolve on its own in 2-4 weeks.   Follow these instructions at home: Safety  Move slowly. Avoid sudden body or head movements or certain positions, as told by your health care provider.  Avoid driving until your health care provider says it is safe for you to do so.  Avoid operating heavy machinery until your health care provider says it is safe for you to do so.  Avoid doing any tasks that would be dangerous to you or others if vertigo occurs.  If you have trouble walking or keeping your balance, try using a cane for stability. If you feel dizzy or unstable, sit down right away.  Return to your normal activities as told by your health care provider. Ask your health care provider what activities are safe for you. General instructions  Take over-the-counter and prescription medicines only as told by  your health care provider.  Drink enough fluid to keep your urine pale yellow.  Keep all follow-up visits as told by your health care provider. This is important. Contact a health care provider if:  You have a fever.  Your condition gets worse or you develop new symptoms.  Your family or friends notice any behavioral changes.  You have nausea or  vomiting that gets worse.  You have numbness or a prickling and tingling sensation. Get help right away if you:  Have difficulty speaking or moving.  Are always dizzy.  Faint.  Develop severe headaches.  Have weakness in your legs or arms.  Have changes in your hearing or vision.  Develop a stiff neck.  Develop sensitivity to light. Summary  Vertigo is the feeling that you or your surroundings are moving when they are not. Benign positional vertigo is the most common form of vertigo.  This condition is caused by crystals in the inner ear that become displaced. This causes a disturbance in an area of the inner ear that helps your brain sense movement and balance.  Symptoms include loss of balance and falling, feeling that you or your surroundings are moving, nausea and vomiting, and blurred vision.  This condition can be diagnosed based on symptoms, a physical exam, and positional tests.  Follow safety instructions as told by your health care provider. You will also be told when to contact your health care provider in case of problems. This information is not intended to replace advice given to you by your health care provider. Make sure you discuss any questions you have with your health care provider. Document Revised: 10/27/2019 Document Reviewed: 05/14/2018 Elsevier Patient Education  2021 [place].   How to Perform the Epley Maneuver The Epley maneuver is an exercise that relieves symptoms of vertigo. Vertigo is the feeling that you or your surroundings are moving when they are not. When you feel vertigo, you may feel like the room is spinning and may have trouble walking. The Epley maneuver is used for a type of vertigo caused by a calcium deposit in a part of the inner ear. The maneuver involves changing head positions to help the deposit move out of the area. You can do this maneuver at home whenever you have symptoms of vertigo. You can repeat it in 24 hours if your  vertigo has not gone away. Even though the Epley maneuver may relieve your vertigo for a few weeks, it is possible that your symptoms will return. This maneuver relieves vertigo, but it does not relieve dizziness. What are the risks? If it is done correctly, the Epley maneuver is considered safe. Sometimes it can lead to dizziness or nausea that goes away after a short time. If you develop other symptoms--such as changes in vision, weakness, or numbness--stop doing the maneuver and call your health care provider. Supplies needed:  A bed or table.  A pillow. How to do the Epley maneuver 1. Sit on the edge of a bed or table with your back straight and your legs extended or hanging over the edge of the bed or table. 2. Turn your head halfway toward the affected ear or side as told by your health care provider. 3. Lie backward quickly with your head turned until you are lying flat on your back. You may want to position a pillow under your shoulders. 4. Hold this position for at least 30 seconds. If you feel dizzy or have symptoms of vertigo, continue to hold  the position until the symptoms stop. 5. Turn your head to the opposite direction until your unaffected ear is facing the floor. 6. Hold this position for at least 30 seconds. If you feel dizzy or have symptoms of vertigo, continue to hold the position until the symptoms stop. 7. Turn your whole body to the same side as your head so that you are positioned on your side. Your head will now be nearly facedown. Hold for at least 30 seconds. If you feel dizzy or have symptoms of vertigo, continue to hold the position until the symptoms stop. 8. Sit back up. You can repeat the maneuver in 24 hours if your vertigo does not go away.      Follow these instructions at home: For 24 hours after doing the Epley maneuver:  Keep your head in an upright position.  When lying down to sleep or rest, keep your head raised (elevated) with two or more  pillows.  Avoid excessive neck movements. Activity  Do not drive or use machinery if you feel dizzy.  After doing the Epley maneuver, return to your normal activities as told by your health care provider. Ask your health care provider what activities are safe for you. General instructions  Drink enough fluid to keep your urine pale yellow.  Do not drink alcohol.  Take over-the-counter and prescription medicines only as told by your health care provider.  Keep all follow-up visits as told by your health care provider. This is important. Preventing vertigo symptoms Ask your health care provider if there is anything you should do at home to prevent vertigo. He or she may recommend that you:  Keep your head elevated with two or more pillows while you sleep.  Do not sleep on the side of your affected ear.  Get up slowly from bed.  Avoid sudden movements during the day.  Avoid extreme head positions or movement, such as looking up or bending over. Contact a health care provider if:  Your vertigo gets worse.  You have other symptoms, including: ? Nausea. ? Vomiting. ? Headache. Get help right away if you:  Have vision changes.  Have a headache or neck pain that is severe or getting worse.  Cannot stop vomiting.  Have new numbness or weakness in any part of your body. Summary  Vertigo is the feeling that you or your surroundings are moving when they are not.  The Epley maneuver is an exercise that relieves symptoms of vertigo.  If the Epley maneuver is done correctly, it is considered safe and relieves vertigo quickly. This information is not intended to replace advice given to you by your health care provider. Make sure you discuss any questions you have with your health care provider. Document Revised: 09/30/2019 Document Reviewed: 09/30/2019 Elsevier Patient Education  2021 [employer].

## 2021-04-15 NOTE — Plan of Care (Signed)
End of Shift Summary:   Alert and oriented x4. VSS. Remained on room air, sats >95%. Denies pain. Nausea managed with prn and scheduled medications. No episodes of emesis this shift. Urine output adequate via urinal. IVF maintained. Remained free from falls or injury. Bed low and in locked position. Call bell within reach and able to use.    Problem: Education: Goal: Knowledge of General Education information will improve Description: Including pain rating scale, medication(s)/side effects and non-pharmacologic comfort measures Outcome: Progressing   Problem: Health Behavior/Discharge Planning: Goal: Ability to manage health-related needs will improve Outcome: Progressing   Problem: Activity: Goal: Risk for activity intolerance will decrease Outcome: Progressing   Problem: Safety: Goal: Ability to remain free from injury will improve Outcome: Progressing

## 2021-04-15 NOTE — Progress Notes (Signed)
Patient discharged home with wife Ronald Hunter. His IVs were removed prior to discharge. Patient left with all personal belongings including cell phone, clothing, and shoes. Patient and wife received discharge education regarding diagnosis, medication regimen,a nd follow up care. They both verbalized understanding of all education they received.

## 2021-04-15 NOTE — Progress Notes (Signed)
Physical Therapy Treatment Patient Details Name: Ronald Hunter MRN: [id number] DOB: 1951/02/01 Today's Date: 04/15/2021    History of Present Illness Ronald Hunter is a 58yoM c PMH: HTN, HLD who presented to the ED with sunnden insidious intractible nausea, vomitting and dizziness 4/27. Pt denies any LOC, denies any recent falls or head tramua, denies any prior similar epsidos. Pt was finishing up some yardwork when this occured. Pt reports having prior intermittent transiet orthostatic dizziness whence rising to standing too quickly, but these episodes are unsimilar to current episode. PMH HTN, back surgeries; fully independent at baseline without device, no balance impairment or falls history.    PT Comments    Pt reports residual vertigo following yesterday's treatment. He states that quick head turns continues to be an exacerbating factor to his vertigo.  Pt re-educated on findings with BPPV testing and potential need to redo Dix-Hallpike treatment. Pt refuses this treatment as he is expected to d/c and does not want to flare vertigo symptoms. Pt is educated on vestibular PT and need to continue services in order to treat s/s of BPPV. Pt reports understanding.   Follow Up Recommendations        Equipment Recommendations  None recommended by PT    Recommendations for Other Services       Precautions / Restrictions Precautions Precautions: Fall Restrictions Weight Bearing Restrictions: No    Mobility  Bed Mobility Overal bed mobility: Modified Independent             General bed mobility comments: Pt found sitting at EOB prior to entrance.    Transfers Overall transfer level: Modified independent                  Ambulation/Gait                 Stairs             Wheelchair Mobility    Modified Rankin (Stroke Patients Only)       Balance Overall balance assessment: Modified Independent                                           Cognition Arousal/Alertness: Awake/alert Behavior During Therapy: WFL for tasks assessed/performed Overall Cognitive Status: Within Functional Limits for tasks assessed                                        Exercises      General Comments        Pertinent Vitals/Pain Pain Assessment: No/denies pain    Home Living                      Prior Function            PT Goals (current goals can now be found in the care plan section) Acute Rehab PT Goals Patient Stated Goal: return to baseline tolerance of head/eye movement/use PT Goal Formulation: With patient Time For Goal Achievement: 04/28/21 Potential to Achieve Goals: Good    Frequency    7X/week      PT Plan Current plan remains appropriate    Co-evaluation              AM-PAC PT "6 Clicks" Mobility   Outcome Measure  End of Session     Patient left: with family/visitor present Nurse Communication: Mobility status PT Visit Diagnosis: Difficulty in walking, not elsewhere classified (R26.2);Unsteadiness on feet (R26.81);Other symptoms and signs involving the nervous system (R29.898);Dizziness and giddiness (R42)     Time: 4680-3212 PT Time Calculation (min) (ACUTE ONLY): 13 min  Charges:  $Therapeutic Activity: 8-22 mins                     11:31 AM, 04/15/21 Ronald Hunter A. Ronald Hunter PT, DPT Physical Therapist - [facility]    Ronald Hunter A Ronald Hunter 04/15/2021, 11:28 AM

## 2021-04-17 ENCOUNTER — Telehealth: Payer: Self-pay

## 2021-04-17 NOTE — Telephone Encounter (Signed)
Reviewed

## 2021-04-17 NOTE — Telephone Encounter (Signed)
Transition Care Management Follow-up Telephone Call  Date of discharge and from where: 04/15/21-[facility]  How have you been since you were released from the hospital? Glad to be home. Still having some dizziness but I am a little better today than yesterday.  Any questions or concerns? No  Items Reviewed:  Did the pt receive and understand the discharge instructions provided? Yes   Medications obtained and verified? Yes   Other? Yes   Any new allergies since your discharge? No   Dietary orders reviewed? Yes  Do you have support at home? Yes   Home Care and Equipment/Supplies: Were home health services ordered? no If so, what is the name of the agency? n/a  Has the agency set up a time to come to the patient's home? not applicable Were any new equipment or medical supplies ordered?  No What is the name of the medical supply agency? n/a Were you able to get the supplies/equipment? not applicable Do you have any questions related to the use of the equipment or supplies? n/a  Functional Questionnaire: (I = Independent and D = Dependent) ADLs: I  Bathing/Dressing- I  Meal Prep- I  Eating- I  Maintaining continence- I  Transferring/Ambulation- I  Managing Meds- I  Follow up appointments reviewed:   PCP Hospital f/u appt confirmed? Yes  Scheduled to see Dr. Josephina Gip on 04/19/2021 @ 11:30.  [facility] f/u appt confirmed? N/A   Are transportation arrangements needed? No   If their condition worsens, is the pt aware to call PCP or go to the Emergency Dept.? Yes  Was the patient provided with contact information for the PCP's office or ED? Yes  Was to pt encouraged to call back with questions or concerns? Yes

## 2021-04-18 ENCOUNTER — Ambulatory Visit: Payer: Medicare Other | Admitting: Family Medicine

## 2021-04-19 ENCOUNTER — Inpatient Hospital Stay: Payer: Medicare Other | Admitting: Family Medicine

## 2021-04-24 ENCOUNTER — Other Ambulatory Visit: Payer: Self-pay

## 2021-04-24 ENCOUNTER — Encounter: Payer: Self-pay | Admitting: Family Medicine

## 2021-04-24 ENCOUNTER — Ambulatory Visit (INDEPENDENT_AMBULATORY_CARE_PROVIDER_SITE_OTHER): Payer: Medicare Other | Admitting: Family Medicine

## 2021-04-24 DIAGNOSIS — J329 Chronic sinusitis, unspecified: Secondary | ICD-10-CM | POA: Diagnosis not present

## 2021-04-24 DIAGNOSIS — I10 Essential (primary) hypertension: Secondary | ICD-10-CM | POA: Diagnosis not present

## 2021-04-24 DIAGNOSIS — H8113 Benign paroxysmal vertigo, bilateral: Secondary | ICD-10-CM | POA: Diagnosis not present

## 2021-04-24 MED ORDER — LISINOPRIL 40 MG PO TABS: 40.0000 mg | ORAL_TABLET | Freq: Every day | ORAL | 3 refills | Status: DC

## 2021-04-24 NOTE — Assessment & Plan Note (Signed)
Generally stable with the change in his medicine.  He will continue lisinopril 40 mg once daily.  We will check a BMP today.  Plan to follow-up in 2 months to see what his blood pressure is doing.

## 2021-04-24 NOTE — Progress Notes (Signed)
[name], MD Phone: [phone]  Ronald Hunter is a 70 y.o. male who presents today for f/u.  Vertigo: Patient was hospitalized for vertiginous symptoms recently.  He was outside and bent over to pick up some sticks in his yard and then stood up and felt a little lightheaded and then felt spinning.  He subsequently felt nauseous.  He was able to sit down and once he sat down the spinning got worse.  He underwent evaluation in the hospital with MRI brain as well as CT angiogram head and neck with no cause for his symptoms.  He had an echo with a normal EF.  There was no significant valvular disease noted.  His potassium was found to be 2.3.  His magnesium level was found to be 1.6.  His potassium was repleted.  He was evaluated by physical therapy and they recommended outpatient therapy.  He notes no tinnitus or ear fullness.  He notes he does feel better than he did previously.  Notes if he turns his head to the right he will start to feel little dizziness.  He has been avoiding bending over.  He has not been driving.  He has occasionally taken the meclizine since discharge though has not needed the Zofran.  Chronic sinusitis: This was diagnosed on CT scan.  He was treated with Augmentin for 10 days.  He did not really have any sinusitis symptoms and notes no significant change with the antibiotics.  Hypertension: His blood pressure medication was changed while he was in the hospital.  They discontinued the hydrochlorothiazide given his hypokalemia.  They increase his lisinopril to 40 mg once daily.  He has not been checking his blood pressure at home.    Social History   Tobacco Use  Smoking Status Never Smoker  Smokeless Tobacco Never Used    Current Outpatient Medications on File Prior to Visit  Medication Sig Dispense Refill  . aspirin 81 MG tablet Take 81 mg by mouth daily.    . meclizine (ANTIVERT) 25 MG tablet Take 1 tablet (25 mg total) by mouth 3 (three) times daily as needed  for dizziness. 45 tablet 0  . Multiple Vitamin (MULTIVITAMIN WITH MINERALS) TABS Take 1 tablet by mouth daily.    . ondansetron (ZOFRAN) 4 MG tablet Take 1 tablet (4 mg total) by mouth every 8 (eight) hours as needed for nausea or vomiting. 20 tablet 0  . rosuvastatin (CRESTOR) 40 MG tablet Take 1 tablet (40 mg total) by mouth daily. 90 tablet 3   No current facility-administered medications on file prior to visit.     ROS see history of present illness  Objective  Physical Exam Vitals:   04/24/21 1128  BP: 130/80  Pulse: 73  Temp: 98.2 F (36.8 C)  SpO2: 98%    BP Readings from Last 3 Encounters:  04/24/21 130/80  04/15/21 (!) 143/89  10/19/20 140/80   Wt Readings from Last 3 Encounters:  04/24/21 238 lb 3.2 oz (108 kg)  04/12/21 231 lb 7.7 oz (105 kg)  10/19/20 236 lb 12.8 oz (107.4 kg)    Physical Exam Constitutional:      General: He is not in acute distress.    Appearance: He is not diaphoretic.  HENT:     Right Ear: Tympanic membrane normal.     Left Ear: Tympanic membrane normal.  Cardiovascular:     Rate and Rhythm: Normal rate and regular rhythm.     Heart sounds: Normal heart sounds.  Pulmonary:     Effort: Pulmonary effort is normal.     Breath sounds: Normal breath sounds.  Skin:    General: Skin is warm and dry.  Neurological:     Mental Status: He is alert.      Assessment/Plan: Please see individual problem list.  Problem List Items Addressed This Visit    Essential hypertension    Generally stable with the change in his medicine.  He will continue lisinopril 40 mg once daily.  We will check a BMP today.  Plan to follow-up in 2 months to see what his blood pressure is doing.      Relevant Medications   lisinopril (ZESTRIL) 40 MG tablet   Benign paroxysmal positional vertigo due to bilateral vestibular disorder    Patient with peripheral vertigo related to BPPV.  I discussed the mechanism of this issue.  We will refer him for vestibular  rehab.  He will avoid driving until he is free of vertigo.      Relevant Orders   Ambulatory referral to Physical Therapy   Hypomagnesemia    Plan to recheck today.      Chronic sinusitis    Status post 10-day course of Augmentin.  Patient is asymptomatic.  He will monitor.        Return in about 2 months (around 06/24/2021) for Hypertension, vertigo.  This visit occurred during the SARS-CoV-2 public health emergency.  Safety protocols were in place, including screening questions prior to the visit, additional usage of staff PPE, and extensive cleaning of exam room while observing appropriate contact time as indicated for disinfecting solutions.   I have spent 31 minutes in the care of this patient regarding history taking, documentation, chart review, placing orders, completion of exam.   [name], MD [place]

## 2021-04-24 NOTE — Assessment & Plan Note (Signed)
Plan to recheck today.

## 2021-04-24 NOTE — Assessment & Plan Note (Signed)
Status post 10-day course of Augmentin.  Patient is asymptomatic.  He will monitor.

## 2021-04-24 NOTE — Assessment & Plan Note (Signed)
Patient with peripheral vertigo related to BPPV.  I discussed the mechanism of this issue.  We will refer him for vestibular rehab.  He will avoid driving until he is free of vertigo.

## 2021-04-24 NOTE — Patient Instructions (Signed)
Nice to see you. If you do not hear anything from the physical therapist regarding vestibular rehab in the next week please let us know. I refilled your lisinopril.  This is for 40 mg tablets so you well only take 1 of these. Will contact you with your lab results.

## 2021-04-25 ENCOUNTER — Encounter: Payer: Self-pay | Admitting: Family Medicine

## 2021-04-25 LAB — BASIC METABOLIC PANEL
BUN/Creatinine Ratio: 18 (calc) (ref 6–22)
BUN: 11 mg/dL (ref 7–25)
CO2: 25 mmol/L (ref 20–32)
Calcium: 9.3 mg/dL (ref 8.6–10.3)
Chloride: 105 mmol/L (ref 98–110)
Creat: 0.62 mg/dL — ABNORMAL LOW (ref 0.70–1.25)
Glucose, Bld: 97 mg/dL (ref 65–99)
Potassium: 4.2 mmol/L (ref 3.5–5.3)
Sodium: 140 mmol/L (ref 135–146)

## 2021-04-25 LAB — MAGNESIUM: Magnesium: 2 mg/dL (ref 1.5–2.5)

## 2021-05-16 ENCOUNTER — Other Ambulatory Visit: Payer: Self-pay

## 2021-05-16 ENCOUNTER — Encounter: Payer: Self-pay | Admitting: Physical Therapy

## 2021-05-16 ENCOUNTER — Ambulatory Visit: Payer: Medicare Other | Attending: Family Medicine | Admitting: Physical Therapy

## 2021-05-16 DIAGNOSIS — R42 Dizziness and giddiness: Secondary | ICD-10-CM | POA: Diagnosis not present

## 2021-05-16 NOTE — Therapy (Signed)
[place] [facility] [address] [place], [place], [address] Phone: [phone]   Fax:  [phone]  Physical Therapy Evaluation  Patient Details  Name: Ronald Hunter MRN: [id number] Date of Birth: 10/24/1951 Referring Provider (PT): Dr. Caryl Bis   Encounter Date: 05/16/2021   PT End of Session - 05/16/21 0914    Visit Number 1    Number of Visits 13    Date for PT Re-Evaluation 08/08/21    PT Start Time 0914    PT Stop Time 1017    PT Time Calculation (min) 63 min    Equipment Utilized During Treatment Gait belt    Activity Tolerance Patient tolerated treatment well    Behavior During Therapy [facility] for tasks assessed/performed           Past Medical History:  Diagnosis Date  . Anxiety   . Arthritis   . Chronic back pain    stenosis/spondylosis  . History of bronchitis    as a child  . History of colon polyps   . History of kidney stones   . Hyperlipidemia    takes Crestor daily  . Hypertension    takes Metoprolol daily  . PONV (postoperative nausea and vomiting)    Nausea with one surgery    Past Surgical History:  Procedure Laterality Date  . COLONOSCOPY    . COLONOSCOPY     several  . COLONOSCOPY W/ POLYPECTOMY    . COLONOSCOPY WITH PROPOFOL N/A 08/26/2018   Procedure: COLONOSCOPY WITH PROPOFOL;  Surgeon: Lucilla Lame, MD;  Location: [facility] ENDOSCOPY;  Service: Endoscopy;  Laterality: N/A;  . cyst removed from under left arm    . KNEE ARTHROSCOPY     right   . KNEE ARTHROSCOPY W/ ACL RECONSTRUCTION    . LUMBAR LAMINECTOMY/DECOMPRESSION MICRODISCECTOMY  08/11/2012   Procedure: LUMBAR LAMINECTOMY/DECOMPRESSION MICRODISCECTOMY 1 LEVEL;  Surgeon: Hosie Spangle, MD;  Location: [facility]UR[facility] ORS;  Service: Neurosurgery;  Laterality: Right;  RIGHT L45 laminotomy and microdiskectomy  . LUMBAR LAMINECTOMY/DECOMPRESSION MICRODISCECTOMY Right 01/29/2015   Procedure: LUMBAR LAMINECTOMY/DECOMPRESSION MICRODISCECTOMY 1 LEVEL;   Surgeon: Hosie Spangle, MD;  Location: [facility] ORS;  Service: Neurosurgery;  Laterality: Right;  Right L45 Laminotomy and microdisketomy  . SHOULDER SURGERY     right   . TONSILLECTOMY      There were no vitals filed for this visit.    Subjective Assessment - 05/16/21 1622    Subjective Patient states that his dizziness symptoms have greatly improved since being in the hospital.  However, patient reports that he continues to get episodes of "fuzziness" that are brought on by head turns and quick movements.    Pertinent History Patient reports that he had sudden onset of vertigo on 04/12/2021. Patient reports he had been doing yard work and had gone to [employer] earlier that day. Then, he bent over to pick up some items and he began to have vertigo. He grabbed the fence so that he get to a spot where he could sit down safely. Patient went to the ER due to vertigo and nausea. Patient states he kept a towel over his eyes for about 4 days because he did not want to get sick.  Per physical therapy notes during hospitalization, patient was noted to have geotropic nystagmus and a positive left Dix-Hallpike test and underwent 1 Epley maneuver treatment. Patient reports he was given a medicine for dizziness and nausea when he left the hospital on Saturday and  he states he took the [place] until Weds. Since that time, he has taken it only 3 times.Patient states he did not drive for 2 weeks after release from the hospital.  Patient reports he has not had any further episodes of dizziness, but reports that he is a "little fuzzy".  Patient reports the fuzziness is always there in the background and states he is "not 100%." Patient reports that if he turns his head to the right in the car and if he turns his head while talking these movements can bring on his symptoms, and reports that the meclizine and slowing his movements help to ease his symptoms.  Patient reports that for the first 15 minutes in the morning  that he moves slowly and touches surfaces for support.  Patient states he has slowed down his movements to try to prevent onset of symptoms. Patient reports that he looked up in the yard and it brought on some dizziness and unsteadiness over the weekend.  Patient describes his dizziness as vertigo and unsteadiness.  Patient reports that his symptoms are better as compared to initial onset.  Patient's symptoms are positional, motion provoked and intermittent in nature.  Patient reports that he has had 2 prior back surgeries and states he does not want to fall.  Patient states that he is very careful when doing yard work.  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    Diagnostic tests MRI of brain 04/13/2021 IMPRESSION:  1. No acute intracranial abnormality.  2. Mild chronic microvascular ischemic disease for age.  3. Chronic right worse than left maxillary sinusitis.  CT angio head and neck on 04/13/2021 IMPRESSION:  1. Negative CTA for large vessel occlusion.  2. Mild for age atheromatous change about the carotid bifurcations  and carotid siphons without hemodynamically significant stenosis.  3. Chronic maxillary sinusitis, right worse than left.    Patient Stated Goals To have decreased dizziness symptoms    Currently in Pain? --   None stated             [facility] PT Assessment - 05/16/21 0001      Assessment   Medical Diagnosis BPPV due to bilateral vestibular disorder    Referring Provider (PT) Dr. Caryl Bis    Onset Date/Surgical Date 04/12/21    Prior Therapy Received Epley maneuver in hospital      Precautions   Precautions None      Restrictions   Weight Bearing Restrictions No      Balance Screen   Has the patient fallen in the past 6 months No    Has the patient had a decrease in activity level because of a fear of falling?  No    Is the patient reluctant to leave their home because of a fear of falling?  No      Home  [place] residence    Living Arrangements Spouse/significant other    Available Help at Discharge Family    Type of [place] to enter    Entrance Stairs-Number of Steps 2 at front and 4 at rear of home with rails      Prior Function   Level of Independence Independent with community mobility without device    Vocation Retired      Associate Professor   Overall Cognitive Status Within Functional Limits for tasks assessed  Standardized Balance Assessment   Standardized Balance Assessment Dynamic Gait Index      Dynamic Gait Index   Level Surface Normal    Change in Gait Speed Normal    Gait with Horizontal Head Turns Normal   pt reports mild symptoms with head turns   Gait with Vertical Head Turns Normal    Gait and Pivot Turn Normal    Step Over Obstacle Normal    Step Around Obstacles Normal    Steps Mild Impairment    Total Score 23            VESTIBULAR AND BALANCE EVALUATION   HISTORY:  Subjective history of current problem: Patient reports that he had sudden onset of vertigo on 04/12/2021. Patient reports he had been doing yard work and had gone to [employer] earlier that day. Then, he bent over to pick up some items and he began to have vertigo. He grabbed the fence so that he get to a spot where he could sit down safely. Patient went to the ER due to vertigo and nausea. Patient states he kept a towel over his eyes for about 4 days because he did not want to get sick.  Patient experienced symptoms of vertigo nausea and vomiting during his hospital stay per patient report.  Per physical therapy notes during hospitalization, patient was noted to have geotropic nystagmus and a positive left Dix-Hallpike test and underwent 1 Epley maneuver treatment. Patient reports he was given a medicine for dizziness and nausea when he left the hospital on Saturday and he states he took the [place] until Weds. Since that time, he has taken it  only 3 times.Patient states he did not drive for 2 weeks after release from the hospital.  Patient reports he has not had any further episodes of dizziness, but reports that he is a "little fuzzy".  Patient reports the fuzziness is always there in the background and states he is "not 100%." Patient reports that if he turns his head to the right in the car and if he turns his head while talking these movements can bring on his symptoms, and reports that the meclizine and slowing his movements help to ease his symptoms.  Patient reports that for the first 15 minutes in the morning that he moves slowly and touches surfaces for support.  Patient states he has slowed down his movements to try to prevent onset of symptoms. Patient reports that he looked up in the yard and it brought on some dizziness and unsteadiness over the weekend.  Patient describes his dizziness as vertigo and unsteadiness.  Patient reports that his symptoms are better as compared to initial onset.  Patient's symptoms are positional, motion provoked and intermittent in nature.  Patient reports that he has had 2 prior back surgeries and states he does not want to fall.  Patient states that he is very careful when doing yard work.  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  Progression of symptoms: better  History of similar episodes: no prior episodes; patient reports he has had transient episodes of lightheadedness when standing up quickly, but reports his symptoms of dizziness since 04/12/2021 are different.  Falls (yes/no): no Number of falls in past 6 months: 0  Auditory complaints (tinnitus, pain, drainage): chronic drainage in sinuses; denies except reports Sunday morning had in right ear 4-5 seconds of tinnitus.  Vision (last eye exam, diplopia, recent  changes): denies  EXAMINATION    MUSCULOSKELETAL SCREEN: Cervical Spine ROM: AROM WFL cervical flexion, extension  and right and left rotation  Gait: Patient arrives ambulating without AD. Patient ambulates with fair cadence with step through gait pattern with reciprocal arm swing and fair scanning of visual environment.   Balance: Deferred secondary to positive right Dix-Hallpike test  POSTURAL CONTROL TESTS:  Clinical Test of Sensory Interaction for Balance (CTSIB):deferred  OCULOMOTOR / VESTIBULAR TESTING: Oculomotor Exam- Room Light  Normal Abnormal Comments  Ocular Alignment N    Ocular ROM   Deferred secondary to positive right Dix-Hallpike test  Spontaneous Nystagmus N    Gaze evoked Nystagmus   Deferred secondary to positive right Dix-Hallpike test  Smooth Pursuit   Deferred secondary to positive right Dix-Hallpike test  Saccades   Deferred secondary to positive right Dix-Hallpike test  VOR   Deferred secondary to positive right Dix-Hallpike test  VOR Cancellation   Deferred secondary to positive right Dix-Hallpike test  Left Head Impulse   Deferred secondary to positive right Dix-Hallpike test  Right Head Impulse   Deferred secondary to positive right Dix-Hallpike test   BPPV TESTS:  Symptoms Duration Intensity Nystagmus  Left Dix-Hallpike None N/A N/A None observed  Right Dix-Hallpike "fuzzy" dizziness symptoms Less than 1 minute 3-4/10 None observed  Left Head Roll None N/A N/A None observed  Right Head Roll None N/A N/A None observed    FUNCTIONAL OUTCOME MEASURES:  Results Comments  DHI 16/100 low perception of handicap  ABC Scale 89.3% Decreased falls risk  DGI 23/24 Safe for community mobility  FOTO 67/100 Given the patient's risk adjustment variables, like-patients nationally had a FS score of 56/100 at intake   Dizziness Positional Status 55.9 (39-70 range)    Canalith Repositioning Maneuver: On inverted mat table, performed right Dix-Hallpike testing and it was potentially positive with no nystagmus noted, but patient reports reproduction of symptoms. Performed right  canalith repositioning maneuver (CRT). Repeated right CRT for a total of 2 maneuvers with retesting between maneuvers. Patient rated 3-4/10 dizziness on first maneuver and 2/10 with second maneuver. Patient describes his dizziness as "fuzziness" sensation.     PT Education - 05/16/21 0914    Education Details Discused BPPV and Epley maneuver and provided handout on BPPV from [email or web address]; discussed plan of care    Person(s) Educated Patient    Methods Explanation;Handout    Comprehension Verbalized understanding            PT Short Term Goals - 05/16/21 0915      PT SHORT TERM GOAL #1   Title Pt will be independent with HEP in order to decrease dizziness symptoms in order to improve function at home and for self-management.    Time 4    Period Weeks    Status New    Target Date 06/13/21      PT SHORT TERM GOAL #2   Time --    Period --    Status --    Target Date --      PT SHORT TERM GOAL #3   Time --    Period --    Status --    Target Date --      PT SHORT TERM GOAL #4   Time --    Period --    Status --    Target Date --             PT Long Term Goals - 05/16/21 1556  PT LONG TERM GOAL #1   Title Patient will have improved FOTO score of 4 points or greater in order to demonstrate improvements in patient's ADLs and functional performance.    Baseline scored 67/100 on 05/16/21    Time 12    Period Weeks    Status New    Target Date 08/08/21      PT LONG TERM GOAL #2   Title Patient will report 50% or greater improvement in his symptoms of dizziness and imbalance with provoking motions or positions.    Time 12    Period Weeks    Status New    Target Date 08/08/21                  Plan - 05/16/21 1601    Clinical Impression Statement Patient presents to the clinic with reports of continued episodes of dizziness which he describes as "fuzziness" since being discharged from the hospital.  Patient was admitted to the hospital on 04/12/2021 due  to sudden onset of vertigo.  Per hospital medical record, patient was noted to have geotropic nystagmus and a positive left Dix-Hallpike maneuver and received 1 Epley treatment maneuver.  Patient with negative left and right head roll test and negative left Dix-Hallpike test this date.  Patient with potentially positive right Dix-Hallpike test has this reproduce patient's symptoms of fuzziness, but no nystagmus was observed.  Performed to right Epley maneuvers.  Patient would benefit from continued vestibular PT services to address functional deficits and goals as stated on plan of care.    Personal Factors and Comorbidities Comorbidity 2;Comorbidity 3+    Comorbidities HTN, vertigo, hyperlipidemia    Examination-Activity Limitations Other   head turning   Stability/Clinical Decision Making Evolving/Moderate complexity    Clinical Decision Making Moderate    Rehab Potential Good    PT Frequency 1x / week    PT Duration 12 weeks    PT Treatment/Interventions Canalith Repostioning;Neuromuscular re-education;Balance training;Therapeutic exercise;Therapeutic activities;Patient/family education    PT Next Visit Plan Retest right DH and perform Epley maneuver if indicated    Consulted and Agree with Plan of Care Patient           Patient will benefit from skilled therapeutic intervention in order to improve the following deficits and impairments:  Dizziness,Decreased balance  Visit Diagnosis: Dizziness and giddiness     Problem List Patient Active Problem List   Diagnosis Date Noted  . Chronic sinusitis 04/24/2021  . Nausea   . Hypomagnesemia   . Hypokalemia 04/13/2021  . Vertigo 04/13/2021  . Benign paroxysmal positional vertigo due to bilateral vestibular disorder   . Intractable nausea and vomiting 04/12/2021  . Prostate cancer screening 10/19/2019  . Obesity (BMI 30.0-34.9) 10/19/2019  . Right Achilles tendinitis 10/19/2019  . Benign neoplasm of ascending colon   . Rectal polyp    . Benign neoplasm of transverse colon   . Family history of colon cancer in father 10/23/2017  . Essential hypertension 07/29/2017  . Hyperlipidemia 07/29/2017  . Chronic low back pain 07/29/2017  . HNP (herniated nucleus pulposus), lumbar 01/29/2015   Lady Deutscher PT, DPT [phone] Lady Deutscher 05/16/2021, 4:30 PM  [place] [facility] [address] [place], [place], [address] Phone: [phone]   Fax:  [phone]  Name: Ronald Hunter MRN: [id number] Date of Birth: 11/15/1951

## 2021-05-23 ENCOUNTER — Other Ambulatory Visit: Payer: Self-pay

## 2021-05-23 ENCOUNTER — Encounter: Payer: Self-pay | Admitting: Physical Therapy

## 2021-05-23 ENCOUNTER — Ambulatory Visit: Payer: Medicare Other | Attending: Family Medicine | Admitting: Physical Therapy

## 2021-05-23 DIAGNOSIS — R42 Dizziness and giddiness: Secondary | ICD-10-CM | POA: Insufficient documentation

## 2021-05-23 NOTE — Therapy (Signed)
[place] MAIN [facility] SERVICES [address] [place], [place], [address] Phone: [phone]   Fax:  [phone]  Physical Therapy Treatment  Patient Details  Name: KELDRIC POYER MRN: [id number] Date of Birth: 03-Feb-1951 Referring Provider (PT): Dr. Caryl Bis   Encounter Date: 05/23/2021   PT End of Session - 05/23/21 0854    Visit Number 2    Number of Visits 13    Date for PT Re-Evaluation 08/08/21    PT Start Time 0854    PT Stop Time 0951    PT Time Calculation (min) 57 min    Activity Tolerance Patient tolerated treatment well    Behavior During Therapy [facility] for tasks assessed/performed           Past Medical History:  Diagnosis Date  . Anxiety   . Arthritis   . Chronic back pain    stenosis/spondylosis  . History of bronchitis    as a child  . History of colon polyps   . History of kidney stones   . Hyperlipidemia    takes Crestor daily  . Hypertension    takes Metoprolol daily  . PONV (postoperative nausea and vomiting)    Nausea with one surgery    Past Surgical History:  Procedure Laterality Date  . COLONOSCOPY    . COLONOSCOPY     several  . COLONOSCOPY W/ POLYPECTOMY    . COLONOSCOPY WITH PROPOFOL N/A 08/26/2018   Procedure: COLONOSCOPY WITH PROPOFOL;  Surgeon: Lucilla Lame, MD;  Location: [facility] ENDOSCOPY;  Service: Endoscopy;  Laterality: N/A;  . cyst removed from under left arm    . KNEE ARTHROSCOPY     right   . KNEE ARTHROSCOPY W/ ACL RECONSTRUCTION    . LUMBAR LAMINECTOMY/DECOMPRESSION MICRODISCECTOMY  08/11/2012   Procedure: LUMBAR LAMINECTOMY/DECOMPRESSION MICRODISCECTOMY 1 LEVEL;  Surgeon: Hosie Spangle, MD;  Location: [place] NEURO ORS;  Service: Neurosurgery;  Laterality: Right;  RIGHT L45 laminotomy and microdiskectomy  . LUMBAR LAMINECTOMY/DECOMPRESSION MICRODISCECTOMY Right 01/29/2015   Procedure: LUMBAR LAMINECTOMY/DECOMPRESSION MICRODISCECTOMY 1 LEVEL;  Surgeon: Hosie Spangle, MD;  Location: [place]  NEURO ORS;  Service: Neurosurgery;  Laterality: Right;  Right L45 Laminotomy and microdisketomy  . SHOULDER SURGERY     right   . TONSILLECTOMY      There were no vitals filed for this visit.   Subjective Assessment - 05/23/21 0854    Subjective Patient states that he cannot tell a change in his symptoms yet. Patient reports that he still is careful with his movements. Patient reports when he turns his head, it is like "it has to play catch up" to the new position and he states that it gets there but it takes a bit. Patient reports it is the turning of his head that brings on these symptoms.    Pertinent History Patient reports that he had sudden onset of vertigo on 04/12/2021. Patient reports he had been doing yard work and had gone to [employer] earlier that day. Then, he bent over to pick up some items and he began to have vertigo. He grabbed the fence so that he get to a spot where he could sit down safely. Patient went to the ER due to vertigo and nausea. Patient states he kept a towel over his eyes for about 4 days because he did not want to get sick.  Per physical therapy notes during hospitalization, patient was noted to have geotropic nystagmus and a positive left Dix-Hallpike test and underwent 1  Epley maneuver treatment. Patient reports he was given a medicine for dizziness and nausea when he left the hospital on Saturday and he states he took the [place] until Weds. Since that time, he has taken it only 3 times.Patient states he did not drive for 2 weeks after release from the hospital.  Patient reports he has not had any further episodes of dizziness, but reports that he is a "little fuzzy".  Patient reports the fuzziness is always there in the background and states he is "not 100%." Patient reports that if he turns his head to the right in the car and if he turns his head while talking these movements can bring on his symptoms, and reports that the meclizine and slowing his movements help to  ease his symptoms.  Patient reports that for the first 15 minutes in the morning that he moves slowly and touches surfaces for support.  Patient states he has slowed down his movements to try to prevent onset of symptoms. Patient reports that he looked up in the yard and it brought on some dizziness and unsteadiness over the weekend.  Patient describes his dizziness as vertigo and unsteadiness.  Patient reports that his symptoms are better as compared to initial onset.  Patient's symptoms are positional, motion provoked and intermittent in nature.  Patient reports that he has had 2 prior back surgeries and states he does not want to fall.  Patient states that he is very careful when doing yard work.  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    Diagnostic tests MRI of brain 04/13/2021 IMPRESSION:  1. No acute intracranial abnormality.  2. Mild chronic microvascular ischemic disease for age.  3. Chronic right worse than left maxillary sinusitis.  CT angio head and neck on 04/13/2021 IMPRESSION:  1. Negative CTA for large vessel occlusion.  2. Mild for age atheromatous change about the carotid bifurcations  and carotid siphons without hemodynamically significant stenosis.  3. Chronic maxillary sinusitis, right worse than left.    Patient Stated Goals To have decreased dizziness symptoms          Neuromuscular Re-education:  Dix-Hallpike: Performed right Dix-Hallpike tests and it was negative with patient denying vertigo and no nystagmus observed.  VOR X 1 exercise:  Demonstrated and educated as to VOR X1.  Patient performed VOR X 1 horizontal in sitting 1 rep of 30 seconds and 1 rep of 1 minute each with verbal cues for technique.  Patient reports 3-4/10 dizziness.  Patient performed VOR X 1 horizontal in standing 2 reps of 1 minute each.  Patient reported 4.5/10 dizziness with standing VOR X1. Issued for HEP.   Ambulation with head  turns:  Patient performed 175' forwards and retro ambulation while scanning for targets in hallway with CGA. Patient reports that turning his head to the right recreates his dizziness symptoms.  Patient performed 175' trials of forwards ambulation with self-selected horizontal and vertical head turns with CGA. Patient reports horizontal head turns is more challenging and recreate his symptoms. Patient reports mild dizziness with these activities.   Airex pad:  On Airex pad, patient performed feet together and semi-tandem progressions with alternating lead leg with and without body turns and horizontal head turns with CGA.  Patient's balance challenged by activities on Airex pad and patient reports mild dizziness symptoms.   Access Code: [email or web address] URL: [email or web address] Date: 05/23/2021 Prepared by: Lady Deutscher  Exercises Feet  together with horizontal head turns - 1 x daily - 7 x weekly - 1 sets - 6 reps - 1 minute hold Semi-tandem stance with horizontal head turns - 1 x daily - 7 x weekly - 1 sets - 10 reps - 1 min hold Standing Gaze Stabilization with Head Rotation - 3 x daily - 7 x weekly - 1 sets - 3 reps - 1 minute hold    PT Education - 05/23/21 0854    Education Details Discussed home exercise program and provided [employer] HEP- VORX1 in standing and feet together and semi-tandem progressions standingn on pillow with horizontal head turns.    Person(s) Educated Patient    Methods Explanation;Demonstration;Verbal cues    Comprehension Verbalized understanding;Returned demonstration            PT Short Term Goals - 05/16/21 0915      PT SHORT TERM GOAL #1   Title Pt will be independent with HEP in order to decrease dizziness symptoms in order to improve function at home and for self-management.    Time 4    Period Weeks    Status New    Target Date 06/13/21      PT SHORT TERM GOAL #2   Time --    Period --    Status --    Target Date --      PT  SHORT TERM GOAL #3   Time --    Period --    Status --    Target Date --      PT SHORT TERM GOAL #4   Time --    Period --    Status --    Target Date --             PT Long Term Goals - 05/16/21 1556      PT LONG TERM GOAL #1   Title Patient will have improved FOTO score of 4 points or greater in order to demonstrate improvements in patient's ADLs and functional performance.    Baseline scored 67/100 on 05/16/21    Time 12    Period Weeks    Status New    Target Date 08/08/21      PT LONG TERM GOAL #2   Title Patient will report 50% or greater improvement in his symptoms of dizziness and imbalance with provoking motions or positions.    Time 12    Period Weeks    Status New    Target Date 08/08/21                 Plan - 05/23/21 1041    Clinical Impression Statement Patient with negative right Dix-Hallpike test this date. Patient reported recreation of dizziness symptoms with VOR X 1 with horizontal head turns and head turning activities on Airex pad and while walking. Patient challenged by feet together and semi-tandem stance with head turning activities on Airex pad. Patient issued exercises for home exercise program via [place] this date. Will plan on reviewing HEP next session and working on activities such as body wall rolls, hallway ball toss and ball toss over shoulder activities. Patient would benefit from continued PT services to further address goals and to try to reduce patient's subjective symptoms of dizziness.    Personal Factors and Comorbidities Comorbidity 2;Comorbidity 3+    Comorbidities HTN, vertigo, hyperlipidemia    Examination-Activity Limitations Other   head turning   Stability/Clinical Decision Making Evolving/Moderate complexity    Rehab Potential Good  PT Frequency 1x / week    PT Duration 12 weeks    PT Treatment/Interventions Canalith Repostioning;Neuromuscular re-education;Balance training;Therapeutic exercise;Therapeutic  activities;Patient/family education    PT Next Visit Plan Review feet together and semi-tandem stance with head turns on Airex pad; consider trying body wall rolls, ball toss over shoulder and hallway ball toss in next sessions.    PT Home Exercise Plan Access Code: [email or web address]  URL: [email or web address]  Date: 05/23/2021  Prepared by: Lady Deutscher    Exercises  Feet together with horizontal head turns - 1 x daily - 7 x weekly - 1 sets - 6 reps - 1 minute hold  Semi-tandem stance with horizontal head turns - 1 x daily - 7 x weekly - 1 sets - 10 reps - 1 min hold  Standing Gaze Stabilization with Head Rotation - 3 x daily - 7 x weekly - 1 sets - 3 reps - 1 minute hold    Consulted and Agree with Plan of Care Patient           Patient will benefit from skilled therapeutic intervention in order to improve the following deficits and impairments:  Dizziness,Decreased balance  Visit Diagnosis: Dizziness and giddiness     Problem List Patient Active Problem List   Diagnosis Date Noted  . Chronic sinusitis 04/24/2021  . Nausea   . Hypomagnesemia   . Hypokalemia 04/13/2021  . Vertigo 04/13/2021  . Benign paroxysmal positional vertigo due to bilateral vestibular disorder   . Intractable nausea and vomiting 04/12/2021  . Prostate cancer screening 10/19/2019  . Obesity (BMI 30.0-34.9) 10/19/2019  . Right Achilles tendinitis 10/19/2019  . Benign neoplasm of ascending colon   . Rectal polyp   . Benign neoplasm of transverse colon   . Family history of colon cancer in father 10/23/2017  . Essential hypertension 07/29/2017  . Hyperlipidemia 07/29/2017  . Chronic low back pain 07/29/2017  . HNP (herniated nucleus pulposus), lumbar 01/29/2015   Lady Deutscher PT, DPT [phone] Lady Deutscher 05/23/2021, 10:48 AM  [place] MAIN [facility] SERVICES [address] [place], [place], [address] Phone: [phone]   Fax:  [phone]  Name: GLENNON KOPKO MRN: [id number] Date of Birth: March 12, 1951

## 2021-05-30 ENCOUNTER — Other Ambulatory Visit: Payer: Self-pay

## 2021-05-30 ENCOUNTER — Encounter: Payer: Self-pay | Admitting: Physical Therapy

## 2021-05-30 ENCOUNTER — Ambulatory Visit: Payer: Medicare Other | Admitting: Physical Therapy

## 2021-05-30 DIAGNOSIS — R42 Dizziness and giddiness: Secondary | ICD-10-CM

## 2021-05-30 NOTE — Therapy (Signed)
[place] MAIN [facility] SERVICES [address] [place], [place], [address] Phone: [phone]   Fax:  [phone]  Physical Therapy Treatment  Patient Details  Name: Ronald Hunter MRN: [id number] Date of Birth: 06/19/1951 Referring Provider (PT): Dr. Caryl Bis   Encounter Date: 05/30/2021   PT End of Session - 05/30/21 1004     Visit Number 3    Number of Visits 13    Date for PT Re-Evaluation 08/08/21    PT Start Time 1004    PT Stop Time 1051    PT Time Calculation (min) 47 min    Equipment Utilized During Treatment --   pt requests no gait belt   Activity Tolerance Patient tolerated treatment well    Behavior During Therapy WFL for tasks assessed/performed             Past Medical History:  Diagnosis Date   Anxiety    Arthritis    Chronic back pain    stenosis/spondylosis   History of bronchitis    as a child   History of colon polyps    History of kidney stones    Hyperlipidemia    takes Crestor daily   Hypertension    takes Metoprolol daily   PONV (postoperative nausea and vomiting)    Nausea with one surgery    Past Surgical History:  Procedure Laterality Date   COLONOSCOPY     COLONOSCOPY     several   COLONOSCOPY W/ POLYPECTOMY     COLONOSCOPY WITH PROPOFOL N/A 08/26/2018   Procedure: COLONOSCOPY WITH PROPOFOL;  Surgeon: Lucilla Lame, MD;  Location: [facility] ENDOSCOPY;  Service: Endoscopy;  Laterality: N/A;   cyst removed from under left arm     KNEE ARTHROSCOPY     right    KNEE ARTHROSCOPY W/ ACL RECONSTRUCTION     LUMBAR LAMINECTOMY/DECOMPRESSION MICRODISCECTOMY  08/11/2012   Procedure: LUMBAR LAMINECTOMY/DECOMPRESSION MICRODISCECTOMY 1 LEVEL;  Surgeon: Hosie Spangle, MD;  Location: [place] NEURO ORS;  Service: Neurosurgery;  Laterality: Right;  RIGHT L45 laminotomy and microdiskectomy   LUMBAR LAMINECTOMY/DECOMPRESSION MICRODISCECTOMY Right 01/29/2015   Procedure: LUMBAR LAMINECTOMY/DECOMPRESSION MICRODISCECTOMY 1  LEVEL;  Surgeon: Hosie Spangle, MD;  Location: [place] NEURO ORS;  Service: Neurosurgery;  Laterality: Right;  Right L45 Laminotomy and microdisketomy   SHOULDER SURGERY     right    TONSILLECTOMY      There were no vitals filed for this visit.   Subjective Assessment - 05/30/21 1005     Subjective Patient states that he states his symptoms were a little better this past week. states he can see some improvement. states if he turns his head to the left okay but turning to the right feels a lag.    Pertinent History Patient reports that he had sudden onset of vertigo on 04/12/2021. Patient reports he had been doing yard work and had gone to [employer] earlier that day. Then, he bent over to pick up some items and he began to have vertigo. He grabbed the fence so that he get to a spot where he could sit down safely. Patient went to the ER due to vertigo and nausea. Patient states he kept a towel over his eyes for about 4 days because he did not want to get sick.  Per physical therapy notes during hospitalization, patient was noted to have geotropic nystagmus and a positive left Dix-Hallpike test and underwent 1 Epley maneuver treatment. Patient reports he was given a medicine for dizziness  and nausea when he left the hospital on Saturday and he states he took the [place] until Weds. Since that time, he has taken it only 3 times.Patient states he did not drive for 2 weeks after release from the hospital.  Patient reports he has not had any further episodes of dizziness, but reports that he is a "little fuzzy".  Patient reports the fuzziness is always there in the background and states he is "not 100%." Patient reports that if he turns his head to the right in the car and if he turns his head while talking these movements can bring on his symptoms, and reports that the meclizine and slowing his movements help to ease his symptoms.  Patient reports that for the first 15 minutes in the morning that he moves slowly  and touches surfaces for support.  Patient states he has slowed down his movements to try to prevent onset of symptoms. Patient reports that he looked up in the yard and it brought on some dizziness and unsteadiness over the weekend.  Patient describes his dizziness as vertigo and unsteadiness.  Patient reports that his symptoms are better as compared to initial onset.  Patient's symptoms are positional, motion provoked and intermittent in nature.  Patient reports that he has had 2 prior back surgeries and states he does not want to fall.  Patient states that he is very careful when doing yard work.  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    Diagnostic tests MRI of brain 04/13/2021 IMPRESSION:  1. No acute intracranial abnormality.  2. Mild chronic microvascular ischemic disease for age.  3. Chronic right worse than left maxillary sinusitis.  CT angio head and neck on 04/13/2021 IMPRESSION:  1. Negative CTA for large vessel occlusion.  2. Mild for age atheromatous change about the carotid bifurcations  and carotid siphons without hemodynamically significant stenosis.  3. Chronic maxillary sinusitis, right worse than left.    Patient Stated Goals To have decreased dizziness symptoms              Neuromuscular Re-education:  VOR X 1 exercise:  Patient performed VOR X 1 horizontal in standing with conflicting background 3 reps of 1 minute each. Patient demonstrating good technique.  Patient reports that when he is turning his head to the right it recreates a "lag" sensation with this activity.   Body Wall Rolls:  Patient performed 5 reps of supported, body wall rolls with eyes open.  Patient performed 5 reps of supported, body wall rolls with eyes closed. Patient reports 4-5/10 dizziness with this activity. Patient reports that rolling to the right is what recreates his symptoms and causes dizziness that lasts seconds.  Added body  wall rolls to HEP.  Diona Foley toss to self:  Patient performed static standing and then ambulating 150' trials while tossing ball to self horizontal while tracking ball with head and eyes with SBA.  Patient denies dizziness with this activity, but reports that he has to concentrate to perform this activity.   Ball toss over shoulder: Patient performed multiple 150' trials of forward and retro ambulation while tossing ball over one shoulder with return catch over opposite shoulder with CGA. Patient reports no dizziness with this activity.  Patient able to progress to VOR X 1 with conflicting background this date and added this progression to HEP. Patient did well with ball toss to self and ball toss over the  shoulder while ambulating activities without eve of imbalance and patient denies dizziness. Patient was challenged by body wall rolls activities especially with eyes closed and added this exercise to HEP. Patient would benefit from continued PT services to further address goals and subjective symptoms. Will plan on trying hallway ball toss and ambulating with quick turns next session.     PT Education - 05/30/21 1308     Education Details Added body wall rolls and conflicting background with VOR X1 progression to HEP    Person(s) Educated Patient    Methods Explanation;Handout;Demonstration;Verbal cues    Comprehension Verbalized understanding;Returned demonstration              PT Short Term Goals - 05/16/21 0915       PT SHORT TERM GOAL #1   Title Pt will be independent with HEP in order to decrease dizziness symptoms in order to improve function at home and for self-management.    Time 4    Period Weeks    Status New    Target Date 06/13/21      PT SHORT TERM GOAL #2   Time --    Period --    Status --    Target Date --      PT SHORT TERM GOAL #3   Time --    Period --    Status --    Target Date --      PT SHORT TERM GOAL #4   Time --    Period --    Status --     Target Date --               PT Long Term Goals - 05/16/21 1556       PT LONG TERM GOAL #1   Title Patient will have improved FOTO score of 4 points or greater in order to demonstrate improvements in patient's ADLs and functional performance.    Baseline scored 67/100 on 05/16/21    Time 12    Period Weeks    Status New    Target Date 08/08/21      PT LONG TERM GOAL #2   Title Patient will report 50% or greater improvement in his symptoms of dizziness and imbalance with provoking motions or positions.    Time 12    Period Weeks    Status New    Target Date 08/08/21               Plan - 06/02/21 1052     Clinical Impression Statement Patient able to progress to VOR X 1 with conflicting background this date and added this progression to HEP. Patient did well with ball toss to self and ball toss over the shoulder while ambulating activities without eve of imbalance and patient denies dizziness. Patient was challenged by body wall rolls activities especially with eyes closed and added this exercise to HEP. Patient would benefit from continued PT services to further address goals and subjective symptoms. Will plan on trying hallway ball toss and ambulating with quick turns next session.    Personal Factors and Comorbidities Comorbidity 2;Comorbidity 3+    Comorbidities HTN, vertigo, hyperlipidemia    Examination-Activity Limitations Other   head turning   Stability/Clinical Decision Making Evolving/Moderate complexity    Rehab Potential Good    PT Frequency 1x / week    PT Duration 12 weeks    PT Treatment/Interventions Canalith Repostioning;Neuromuscular re-education;Balance training;Therapeutic exercise;Therapeutic activities;Patient/family education    PT Next Visit  Plan Review feet together and semi-tandem stance with head turns on Airex pad; consider trying body wall rolls, ball toss over shoulder and hallway ball toss in next sessions.    PT Home Exercise Plan Access  Code: [email or web address]  URL: [email or web address]  Date: 05/23/2021  Prepared by: Lady Deutscher    Exercises  Feet together with horizontal head turns - 1 x daily - 7 x weekly - 1 sets - 6 reps - 1 minute hold  Semi-tandem stance with horizontal head turns - 1 x daily - 7 x weekly - 1 sets - 10 reps - 1 min hold  Standing Gaze Stabilization with Head Rotation - 3 x daily - 7 x weekly - 1 sets - 3 reps - 1 minute hold    Consulted and Agree with Plan of Care Patient                   Patient will benefit from skilled therapeutic intervention in order to improve the following deficits and impairments:     Visit Diagnosis: Dizziness and giddiness     Problem List Patient Active Problem List   Diagnosis Date Noted   Chronic sinusitis 04/24/2021   Nausea    Hypomagnesemia    Hypokalemia 04/13/2021   Vertigo 04/13/2021   Benign paroxysmal positional vertigo due to bilateral vestibular disorder    Intractable nausea and vomiting 04/12/2021   Prostate cancer screening 10/19/2019   Obesity (BMI 30.0-34.9) 10/19/2019   Right Achilles tendinitis 10/19/2019   Benign neoplasm of ascending colon    Rectal polyp    Benign neoplasm of transverse colon    Family history of colon cancer in father 10/23/2017   Essential hypertension 07/29/2017   Hyperlipidemia 07/29/2017   Chronic low back pain 07/29/2017   HNP (herniated nucleus pulposus), lumbar 01/29/2015   Lady Deutscher PT, DPT [phone] Lady Deutscher 05/30/2021, 1:09 PM  [place] MAIN [facility] SERVICES [address] [place], [place], [address] Phone: [phone]   Fax:  [phone]  Name: Ronald Hunter MRN: [id number] Date of Birth: 06-07-1951

## 2021-06-06 ENCOUNTER — Encounter: Payer: Self-pay | Admitting: Physical Therapy

## 2021-06-06 ENCOUNTER — Other Ambulatory Visit: Payer: Self-pay

## 2021-06-06 ENCOUNTER — Ambulatory Visit: Payer: Medicare Other | Admitting: Physical Therapy

## 2021-06-06 DIAGNOSIS — R42 Dizziness and giddiness: Secondary | ICD-10-CM | POA: Diagnosis not present

## 2021-06-07 NOTE — Therapy (Signed)
[place] MAIN [facility] SERVICES [address] [place], [place], [address] Phone: [phone]   Fax:  [phone]  Physical Therapy Treatment  Patient Details  Name: Ronald Hunter MRN: [id number] Date of Birth: 1951/01/16 Referring Provider (PT): Dr. Caryl Bis   Encounter Date: 06/06/2021   PT End of Session - 06/06/21 0906     Visit Number 4    Number of Visits 13    Date for PT Re-Evaluation 08/08/21    PT Start Time 0906    PT Stop Time 0952    PT Time Calculation (min) 46 min    Equipment Utilized During Treatment --   pt requests no gait belt   Activity Tolerance Patient tolerated treatment well    Behavior During Therapy WFL for tasks assessed/performed             Past Medical History:  Diagnosis Date   Anxiety    Arthritis    Chronic back pain    stenosis/spondylosis   History of bronchitis    as a child   History of colon polyps    History of kidney stones    Hyperlipidemia    takes Crestor daily   Hypertension    takes Metoprolol daily   PONV (postoperative nausea and vomiting)    Nausea with one surgery    Past Surgical History:  Procedure Laterality Date   COLONOSCOPY     COLONOSCOPY     several   COLONOSCOPY W/ POLYPECTOMY     COLONOSCOPY WITH PROPOFOL N/A 08/26/2018   Procedure: COLONOSCOPY WITH PROPOFOL;  Surgeon: Lucilla Lame, MD;  Location: [facility];  Service: Endoscopy;  Laterality: N/A;   cyst removed from under left arm     KNEE ARTHROSCOPY     right    KNEE ARTHROSCOPY W/ ACL RECONSTRUCTION     LUMBAR LAMINECTOMY/DECOMPRESSION MICRODISCECTOMY  08/11/2012   Procedure: LUMBAR LAMINECTOMY/DECOMPRESSION MICRODISCECTOMY 1 LEVEL;  Surgeon: Hosie Spangle, MD;  Location: [place] [facility];  Service: Neurosurgery;  Laterality: Right;  RIGHT L45 laminotomy and microdiskectomy   LUMBAR LAMINECTOMY/DECOMPRESSION MICRODISCECTOMY Right 01/29/2015   Procedure: LUMBAR LAMINECTOMY/DECOMPRESSION MICRODISCECTOMY 1  LEVEL;  Surgeon: Hosie Spangle, MD;  Location: [place] [facility];  Service: Neurosurgery;  Laterality: Right;  Right L45 Laminotomy and microdisketomy   SHOULDER SURGERY     right    TONSILLECTOMY      There were no vitals filed for this visit.   Subjective Assessment - 06/06/21 0910     Subjective Patient states he had a few days were he felt he regressed a little bit. Patient states a few episodes of that lagging feeling and fuzziness.    Pertinent History Patient reports that he had sudden onset of vertigo on 04/12/2021. Patient reports he had been doing yard work and had gone to [employer] earlier that day. Then, he bent over to pick up some items and he began to have vertigo. He grabbed the fence so that he get to a spot where he could sit down safely. Patient went to the ER due to vertigo and nausea. Patient states he kept a towel over his eyes for about 4 days because he did not want to get sick.  Per physical therapy notes during hospitalization, patient was noted to have geotropic nystagmus and a positive left Dix-Hallpike test and underwent 1 Epley maneuver treatment. Patient reports he was given a medicine for dizziness and nausea when he left the hospital on Saturday and he states  he took the [place] until Weds. Since that time, he has taken it only 3 times.Patient states he did not drive for 2 weeks after release from the hospital.  Patient reports he has not had any further episodes of dizziness, but reports that he is a "little fuzzy".  Patient reports the fuzziness is always there in the background and states he is "not 100%." Patient reports that if he turns his head to the right in the car and if he turns his head while talking these movements can bring on his symptoms, and reports that the meclizine and slowing his movements help to ease his symptoms.  Patient reports that for the first 15 minutes in the morning that he moves slowly and touches surfaces for support.  Patient states he has  slowed down his movements to try to prevent onset of symptoms. Patient reports that he looked up in the yard and it brought on some dizziness and unsteadiness over the weekend.  Patient describes his dizziness as vertigo and unsteadiness.  Patient reports that his symptoms are better as compared to initial onset.  Patient's symptoms are positional, motion provoked and intermittent in nature.  Patient reports that he has had 2 prior back surgeries and states he does not want to fall.  Patient states that he is very careful when doing yard work.  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    Diagnostic tests MRI of brain 04/13/2021 IMPRESSION:  1. No acute intracranial abnormality.  2. Mild chronic microvascular ischemic disease for age.  3. Chronic right worse than left maxillary sinusitis.  CT angio head and neck on 04/13/2021 IMPRESSION:  1. Negative CTA for large vessel occlusion.  2. Mild for age atheromatous change about the carotid bifurcations  and carotid siphons without hemodynamically significant stenosis.  3. Chronic maxillary sinusitis, right worse than left.    Patient Stated Goals To have decreased dizziness symptoms              Neuromuscular Re-education:  VOR X 1 exercise:  Patient performed walking VOR X 1 horizontal with conflicting background 6 reps of about 63' each.  Added this progression to home exercise program.   Hallway ball toss:  In hallway, worked on ball toss against one wall with alternating quick turns to toss ball against opposite wall while tracking with eyes and head. Patient reports that this activity reproduces that "lagging" feeling whenever he is turning to the right side.  Added to home exercise program.   Diona Foley toss over shoulder: Patient performed multiple 200' trials of forward and retro ambulation while tossing ball over one shoulder with return catch over opposite shoulder varying the ball  position to head, shoulder and waist level to promote head turning and tilting with CGA. Patient reports that he is getting that "lag" sensation when making right turns of his head.  Patient reports "mild fuzzy" feeling with retro ambulation with ball toss activity.   Quick Turns:  Patient performed 8 reps of walking about 8' with alternating quick turns left and right.  Then repeated activity making only right quick turns multiple reps. Patient reports sensation of "lagging" with right turns.      PT Education - 06/07/21 0631     Education Details discussed home exercise program    Person(s) Educated Patient    Methods Explanation    Comprehension Verbalized understanding  PT Short Term Goals - 05/16/21 0915       PT SHORT TERM GOAL #1   Title Pt will be independent with HEP in order to decrease dizziness symptoms in order to improve function at home and for self-management.    Time 4    Period Weeks    Status New    Target Date 06/13/21      PT SHORT TERM GOAL #2   Time --    Period --    Status --    Target Date --      PT SHORT TERM GOAL #3   Time --    Period --    Status --    Target Date --      PT SHORT TERM GOAL #4   Time --    Period --    Status --    Target Date --               PT Long Term Goals - 05/16/21 1556       PT LONG TERM GOAL #1   Title Patient will have improved FOTO score of 4 points or greater in order to demonstrate improvements in patient's ADLs and functional performance.    Baseline scored 67/100 on 05/16/21    Time 12    Period Weeks    Status New    Target Date 08/08/21      PT LONG TERM GOAL #2   Title Patient will report 50% or greater improvement in his symptoms of dizziness and imbalance with provoking motions or positions.    Time 12    Period Weeks    Status New    Target Date 08/08/21                Plan - 06/08/21 1529     Clinical Impression Statement Patient able to progress to  walking VOR X 1 with conflicting background this date. Patient reports the reproduction of the "lagging" sensation with activities involving right head turns and right body turns this date during the hallway ball toss, quick turn and ball toss over shoulder activities. Patient reports "mild fuzzy" sensation with retro ambulation with ball toss over shoulder. Patient demonstrates good balance without evidence of imbalance with ball toss over shoulder and quick turn activities. Will plan on working on progressions of activities that reproduce patient's symptoms such as right head and body turns next session. Patient would benefit from continued PT services to try to further reduce patient's subjective symptoms and goals as set on plan of care.    Personal Factors and Comorbidities Comorbidity 2;Comorbidity 3+    Comorbidities HTN, vertigo, hyperlipidemia    Examination-Activity Limitations Other   head turning   Stability/Clinical Decision Making Evolving/Moderate complexity    Rehab Potential Good    PT Frequency 1x / week    PT Duration 12 weeks    PT Treatment/Interventions Canalith Repostioning;Neuromuscular re-education;Balance training;Therapeutic exercise;Therapeutic activities;Patient/family education    PT Next Visit Plan Review feet together and semi-tandem stance with head turns on Airex pad; consider trying body wall rolls, ball toss over shoulder and hallway ball toss in next sessions.    PT Home Exercise Plan Access Code: [email or web address]  URL: [email or web address]  Date: 05/23/2021  Prepared by: Lady Deutscher    Exercises  Feet together with horizontal head turns - 1 x daily - 7 x weekly - 1 sets - 6 reps - 1 minute hold  Semi-tandem stance with horizontal head turns -  1 x daily - 7 x weekly - 1 sets - 10 reps - 1 min hold  Standing Gaze Stabilization with Head Rotation - 3 x daily - 7 x weekly - 1 sets - 3 reps - 1 minute hold    Consulted and Agree with Plan of Care Patient                   Patient will benefit from skilled therapeutic intervention in order to improve the following deficits and impairments:     Visit Diagnosis: Dizziness and giddiness     Problem List Patient Active Problem List   Diagnosis Date Noted   Chronic sinusitis 04/24/2021   Nausea    Hypomagnesemia    Hypokalemia 04/13/2021   Vertigo 04/13/2021   Benign paroxysmal positional vertigo due to bilateral vestibular disorder    Intractable nausea and vomiting 04/12/2021   Prostate cancer screening 10/19/2019   Obesity (BMI 30.0-34.9) 10/19/2019   Right Achilles tendinitis 10/19/2019   Benign neoplasm of ascending colon    Rectal polyp    Benign neoplasm of transverse colon    Family history of colon cancer in father 10/23/2017   Essential hypertension 07/29/2017   Hyperlipidemia 07/29/2017   Chronic low back pain 07/29/2017   HNP (herniated nucleus pulposus), lumbar 01/29/2015   Lady Deutscher PT, DPT [phone] Lady Deutscher 06/07/2021, 6:32 AM  [place] MAIN [facility] SERVICES [address] [place], [place], [address] Phone: [phone]   Fax:  [phone]  Name: Ronald Hunter MRN: [id number] Date of Birth: 30-Dec-1950

## 2021-06-20 ENCOUNTER — Ambulatory Visit: Payer: Medicare Other | Attending: Family Medicine | Admitting: Physical Therapy

## 2021-06-20 ENCOUNTER — Other Ambulatory Visit: Payer: Self-pay

## 2021-06-20 ENCOUNTER — Encounter: Payer: Self-pay | Admitting: Physical Therapy

## 2021-06-20 DIAGNOSIS — R42 Dizziness and giddiness: Secondary | ICD-10-CM | POA: Diagnosis not present

## 2021-06-20 NOTE — Therapy (Signed)
[place] MAIN [facility] SERVICES [address] [place], [place], [address] Phone: [phone]   Fax:  [phone]  Physical Therapy Treatment  Patient Details  Name: Ronald Hunter MRN: [id number] Date of Birth: 1951-08-14 Referring Provider (PT): Dr. Caryl Bis   Encounter Date: 06/20/2021   PT End of Session - 06/20/21 0859     Visit Number 5    Number of Visits 13    Date for PT Re-Evaluation 08/08/21    PT Start Time 0859    PT Stop Time 0948    PT Time Calculation (min) 49 min    Equipment Utilized During Treatment --   pt requests no gait belt   Activity Tolerance Patient tolerated treatment well    Behavior During Therapy WFL for tasks assessed/performed             Past Medical History:  Diagnosis Date   Anxiety    Arthritis    Chronic back pain    stenosis/spondylosis   History of bronchitis    as a child   History of colon polyps    History of kidney stones    Hyperlipidemia    takes Crestor daily   Hypertension    takes Metoprolol daily   PONV (postoperative nausea and vomiting)    Nausea with one surgery    Past Surgical History:  Procedure Laterality Date   COLONOSCOPY     COLONOSCOPY     several   COLONOSCOPY W/ POLYPECTOMY     COLONOSCOPY WITH PROPOFOL N/A 08/26/2018   Procedure: COLONOSCOPY WITH PROPOFOL;  Surgeon: Lucilla Lame, MD;  Location: [facility] ENDOSCOPY;  Service: Endoscopy;  Laterality: N/A;   cyst removed from under left arm     KNEE ARTHROSCOPY     right    KNEE ARTHROSCOPY W/ ACL RECONSTRUCTION     LUMBAR LAMINECTOMY/DECOMPRESSION MICRODISCECTOMY  08/11/2012   Procedure: LUMBAR LAMINECTOMY/DECOMPRESSION MICRODISCECTOMY 1 LEVEL;  Surgeon: Hosie Spangle, MD;  Location: [place] [facility];  Service: Neurosurgery;  Laterality: Right;  RIGHT L45 laminotomy and microdiskectomy   LUMBAR LAMINECTOMY/DECOMPRESSION MICRODISCECTOMY Right 01/29/2015   Procedure: LUMBAR LAMINECTOMY/DECOMPRESSION MICRODISCECTOMY 1  LEVEL;  Surgeon: Hosie Spangle, MD;  Location: [place] [facility];  Service: Neurosurgery;  Laterality: Right;  Right L45 Laminotomy and microdisketomy   SHOULDER SURGERY     right    TONSILLECTOMY      There were no vitals filed for this visit.   Subjective Assessment - 06/20/21 0857     Subjective Patient states he is still getting the lagging feeling at times when turning to the right.    Pertinent History Patient reports that he had sudden onset of vertigo on 04/12/2021. Patient reports he had been doing yard work and had gone to [employer] earlier that day. Then, he bent over to pick up some items and he began to have vertigo. He grabbed the fence so that he get to a spot where he could sit down safely. Patient went to the ER due to vertigo and nausea. Patient states he kept a towel over his eyes for about 4 days because he did not want to get sick.  Per physical therapy notes during hospitalization, patient was noted to have geotropic nystagmus and a positive left Dix-Hallpike test and underwent 1 Epley maneuver treatment. Patient reports he was given a medicine for dizziness and nausea when he left the hospital on Saturday and he states he took the [place] until Weds. Since that time, he  has taken it only 3 times.Patient states he did not drive for 2 weeks after release from the hospital.  Patient reports he has not had any further episodes of dizziness, but reports that he is a "little fuzzy".  Patient reports the fuzziness is always there in the background and states he is "not 100%." Patient reports that if he turns his head to the right in the car and if he turns his head while talking these movements can bring on his symptoms, and reports that the meclizine and slowing his movements help to ease his symptoms.  Patient reports that for the first 15 minutes in the morning that he moves slowly and touches surfaces for support.  Patient states he has slowed down his movements to try to prevent onset  of symptoms. Patient reports that he looked up in the yard and it brought on some dizziness and unsteadiness over the weekend.  Patient describes his dizziness as vertigo and unsteadiness.  Patient reports that his symptoms are better as compared to initial onset.  Patient's symptoms are positional, motion provoked and intermittent in nature.  Patient reports that he has had 2 prior back surgeries and states he does not want to fall.  Patient states that he is very careful when doing yard work.  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    Diagnostic tests MRI of brain 04/13/2021 IMPRESSION:  1. No acute intracranial abnormality.  2. Mild chronic microvascular ischemic disease for age.  3. Chronic right worse than left maxillary sinusitis.  CT angio head and neck on 04/13/2021 IMPRESSION:  1. Negative CTA for large vessel occlusion.  2. Mild for age atheromatous change about the carotid bifurcations  and carotid siphons without hemodynamically significant stenosis.  3. Chronic maxillary sinusitis, right worse than left.    Patient Stated Goals To have decreased dizziness symptoms             Neuromuscular Re-education:  Habituation Exercise: Performed 10 reps right nose towards knee habituation exercise in sitting.  Performed 10 reps head turning horizontal in sitting.  Patient reports that these exercises do not reproduce his symptoms.  In standing, patient performed turning head from left to center to right side and repeated 20 reps. Patient reports that he gets that lagging sensation from 10-12 o'clock in his visual field.  Patient reports reproduction of his dizziness symptoms of a lagging sensation and "fuzziness". Added standing with horizontal head turns habituation exercise for HEP.   Ball Sorting Activity: On firm surface, performed transferring multicolored balls from bin placed on chair to another bin placed 180 degrees on  the other side of patient while patient visually tracks the ball so that patient is required to turn 180 degrees Left to Right. Patient reports that if he keeps his eyes on the ball that this does not reproduce his symptoms. Repeated activity with patient looking out into the gym while turning left to right and patient reports that this reproduced his dizziness and the lagging/fuzziness sensation. Repeated the activity, at varying distances with conflicting background drapes in front of his visual field. Patient reports the more wide and deep the visual field is the more it recreated his symptoms.  Discussed performing similar activity at home in front of a window looking outside or standing on his patio looking out into his yard depending on the weather.     PT Education - 06/20/21 5631  Education Details discussed home exercise program and exercise technique during session    Person(s) Educated Patient    Methods Explanation    Comprehension Verbalized understanding              PT Short Term Goals - 05/16/21 0915       PT SHORT TERM GOAL #1   Title Pt will be independent with HEP in order to decrease dizziness symptoms in order to improve function at home and for self-management.    Time 4    Period Weeks    Status New    Target Date 06/13/21      PT SHORT TERM GOAL #2   Time --    Period --    Status --    Target Date --      PT SHORT TERM GOAL #3   Time --    Period --    Status --    Target Date --      PT SHORT TERM GOAL #4   Time --    Period --    Status --    Target Date --               PT Long Term Goals - 05/16/21 1556       PT LONG TERM GOAL #1   Title Patient will have improved FOTO score of 4 points or greater in order to demonstrate improvements in patient's ADLs and functional performance.    Baseline scored 67/100 on 05/16/21    Time 12    Period Weeks    Status New    Target Date 08/08/21      PT LONG TERM GOAL #2   Title Patient  will report 50% or greater improvement in his symptoms of dizziness and imbalance with provoking motions or positions.    Time 12    Period Weeks    Status New    Target Date 08/08/21                   Plan - 06/20/21 1610     Clinical Impression Statement Patient continues to report a lagging sensation and fuzzy headedness with turning his head from left rotation to the right rotation position.  Worked on habituation exercises and was unable to reproduce symptoms in sitting; however, patient reported reproduction of locking sensation when performing standing with horizontal head turns habituation exercise.  In addition performed ball sorting activity and patient reports that when his vision is fixated on the object it does not reproduce his symptoms however if he looks in the distance this recreates his dizziness symptoms.  Discussed adding these activities to his home exercise program.  We will consider repeating functional outcome measure testing next session.  Patient would continue to benefit from vestibular PT services to further address goals and functional deficits.    Personal Factors and Comorbidities Comorbidity 2;Comorbidity 3+    Comorbidities HTN, vertigo, hyperlipidemia    Examination-Activity Limitations Other   head turning   Stability/Clinical Decision Making Evolving/Moderate complexity    Rehab Potential Good    PT Frequency 1x / week    PT Duration 12 weeks    PT Treatment/Interventions Canalith Repostioning;Neuromuscular re-education;Balance training;Therapeutic exercise;Therapeutic activities;Patient/family education    PT Next Visit Plan Review feet together and semi-tandem stance with head turns on Airex pad; consider trying body wall rolls, ball toss over shoulder and hallway ball toss in next sessions.    PT Home Exercise Plan Access  Code: [email or web address]  URL: [email or web address]  Date: 05/23/2021  Prepared by: Lady Deutscher    Exercises  Feet  together with horizontal head turns - 1 x daily - 7 x weekly - 1 sets - 6 reps - 1 minute hold  Semi-tandem stance with horizontal head turns - 1 x daily - 7 x weekly - 1 sets - 10 reps - 1 min hold  Standing Gaze Stabilization with Head Rotation - 3 x daily - 7 x weekly - 1 sets - 3 reps - 1 minute hold; added standing with horizontal head turns habituation exercise    Consulted and Agree with Plan of Care Patient             Patient will benefit from skilled therapeutic intervention in order to improve the following deficits and impairments:  Dizziness, Decreased balance  Visit Diagnosis: Dizziness and giddiness     Problem List Patient Active Problem List   Diagnosis Date Noted   Chronic sinusitis 04/24/2021   Nausea    Hypomagnesemia    Hypokalemia 04/13/2021   Vertigo 04/13/2021   Benign paroxysmal positional vertigo due to bilateral vestibular disorder    Intractable nausea and vomiting 04/12/2021   Prostate cancer screening 10/19/2019   Obesity (BMI 30.0-34.9) 10/19/2019   Right Achilles tendinitis 10/19/2019   Benign neoplasm of ascending colon    Rectal polyp    Benign neoplasm of transverse colon    Family history of colon cancer in father 10/23/2017   Essential hypertension 07/29/2017   Hyperlipidemia 07/29/2017   Chronic low back pain 07/29/2017   HNP (herniated nucleus pulposus), lumbar 01/29/2015   Lady Deutscher PT, DPT [phone] Lady Deutscher 06/20/2021, 4:13 PM  [place] MAIN [facility] SERVICES [address] [place], [place], [address] Phone: [phone]   Fax:  [phone]  Name: Ronald Hunter MRN: [id number] Date of Birth: September 23, 1951

## 2021-07-04 ENCOUNTER — Ambulatory Visit (INDEPENDENT_AMBULATORY_CARE_PROVIDER_SITE_OTHER): Payer: Medicare Other | Admitting: Family Medicine

## 2021-07-04 ENCOUNTER — Other Ambulatory Visit: Payer: Self-pay

## 2021-07-04 DIAGNOSIS — I10 Essential (primary) hypertension: Secondary | ICD-10-CM | POA: Diagnosis not present

## 2021-07-04 DIAGNOSIS — H8113 Benign paroxysmal vertigo, bilateral: Secondary | ICD-10-CM | POA: Diagnosis not present

## 2021-07-04 NOTE — Patient Instructions (Signed)
Nice to see you. Please continue with the exercises for the vertigo. Please continue to monitor your blood pressure.  If it trends up please let us know.

## 2021-07-04 NOTE — Progress Notes (Signed)
[name], MD Phone: [phone]  Ronald Hunter is a 70 y.o. male who presents today for f/u.  HYPERTENSION Disease Monitoring Home BP Monitoring <130/80 Chest pain- no    Dyspnea- no Medications Compliance-  taking lisinopril 40 mg daily.  Edema- no  Vertigo: Patient did 6 rounds of physical therapy.  He notes he is quite a bit better from when he was discharged from the hospital.  At times he still feels a little fuzzy particularly when he turns from looking to the left to looking to the right.  Notes there is a little bit of a lag from the 10 o'clock position to the 12 o'clock position.  No dizziness.  No balance issues.  No falls.  He took meclizine a couple of times initially after discharge and notes it was hard to tell if it did very much.  Notes no ear fullness, persistent tinnitus, or hearing loss.  He does note occasionally being able to hear his heartbeat in his right ear when he lays down on the right side at night though if he lays down on the left side to start with does not notice this if he turns over later in the night.  He had negative brain MRI and CT angio head and neck imaging.   Social History   Tobacco Use  Smoking Status Never  Smokeless Tobacco Never    Current Outpatient Medications on File Prior to Visit  Medication Sig Dispense Refill   aspirin 81 MG tablet Take 81 mg by mouth daily.     lisinopril (ZESTRIL) 40 MG tablet Take 1 tablet (40 mg total) by mouth daily. 90 tablet 3   Multiple Vitamin (MULTIVITAMIN WITH MINERALS) TABS Take 1 tablet by mouth daily.     meclizine (ANTIVERT) 25 MG tablet Take 1 tablet (25 mg total) by mouth 3 (three) times daily as needed for dizziness. (Patient not taking: Reported on 07/04/2021) 45 tablet 0   ondansetron (ZOFRAN) 4 MG tablet Take 1 tablet (4 mg total) by mouth every 8 (eight) hours as needed for nausea or vomiting. (Patient not taking: Reported on 07/04/2021) 20 tablet 0   rosuvastatin (CRESTOR) 40 MG tablet Take  1 tablet (40 mg total) by mouth daily. 90 tablet 3   No current facility-administered medications on file prior to visit.     ROS see history of present illness  Objective  Physical Exam Vitals:   07/04/21 0837 07/04/21 0846  BP: 140/80 130/80  Pulse: 71   Temp: 98.6 F (37 C)   SpO2: 98%     BP Readings from Last 3 Encounters:  07/04/21 130/80  04/24/21 130/80  04/15/21 (!) 143/89   Wt Readings from Last 3 Encounters:  07/04/21 240 lb 3.2 oz (109 kg)  04/24/21 238 lb 3.2 oz (108 kg)  04/12/21 231 lb 7.7 oz (105 kg)    Physical Exam Constitutional:      General: He is not in acute distress.    Appearance: He is not diaphoretic.  HENT:     Right Ear: Tympanic membrane normal.     Left Ear: Tympanic membrane normal.  Cardiovascular:     Rate and Rhythm: Normal rate and regular rhythm.     Heart sounds: Normal heart sounds.  Pulmonary:     Effort: Pulmonary effort is normal.     Breath sounds: Normal breath sounds.  Skin:    General: Skin is warm and dry.  Neurological:     Mental Status: He is  alert.     Assessment/Plan: Please see individual problem list.  Problem List Items Addressed This Visit     Benign paroxysmal positional vertigo due to bilateral vestibular disorder    Much improved.  He still has a little bit of a fuzzy sensation which is likely residual from his prior vertigo.  He will continue exercises at home for this.  Discussed this likely will improve back to his baseline though he may have some residual vertiginous symptoms moving forward.  He will monitor.       Essential hypertension    Adequate control.  He will continue lisinopril 40 mg once daily.  We will follow-up in 4 months as appointment.  He will let us know if his blood pressure trends up at home.        Return in about 4 months (around 11/04/2021) for Hypertension.  This visit occurred during the SARS-CoV-2 public health emergency.  Safety protocols were in place,  including screening questions prior to the visit, additional usage of staff PPE, and extensive cleaning of exam room while observing appropriate contact time as indicated for disinfecting solutions.    [name], MD [place]

## 2021-07-04 NOTE — Assessment & Plan Note (Signed)
Adequate control.  He will continue lisinopril 40 mg once daily.  We will follow-up in 4 months as appointment.  He will let us know if his blood pressure trends up at home.

## 2021-07-04 NOTE — Assessment & Plan Note (Signed)
Much improved.  He still has a little bit of a fuzzy sensation which is likely residual from his prior vertigo.  He will continue exercises at home for this.  Discussed this likely will improve back to his baseline though he may have some residual vertiginous symptoms moving forward.  He will monitor.

## 2021-07-11 ENCOUNTER — Ambulatory Visit (INDEPENDENT_AMBULATORY_CARE_PROVIDER_SITE_OTHER): Payer: Medicare Other

## 2021-07-11 VITALS — Ht 72.0 in | Wt 240.0 lb

## 2021-07-11 DIAGNOSIS — Z0000 Encounter for general adult medical examination without abnormal findings: Secondary | ICD-10-CM | POA: Diagnosis not present

## 2021-07-11 NOTE — Progress Notes (Signed)
Subjective:   Ronald Hunter is a 70 y.o. male who presents for Medicare Annual/Subsequent preventive examination.  Review of Systems    No ROS.  Medicare Wellness Virtual Visit.  Visual/audio telehealth visit, UTA vital signs.   See social history for additional risk factors.   Cardiac Risk Factors include: advanced age (>76mn, >>26women);male gender     Objective:    Today's Vitals   07/11/21 0845  Weight: 240 lb (108.9 kg)  Height: 6' (1.829 m)   Body mass index is 32.55 kg/m.  Advanced Directives 07/11/2021 04/13/2021 04/12/2021 07/08/2020 07/08/2019 08/26/2018 01/28/2015  Does Patient Have a Medical Advance Directive? Yes No No No No No No  Type [place] - - - - - -  Does patient want to make changes to medical advance directive? No - Patient declined - - - - - -  Copy of [place] Chart? No - copy requested - - - - - -  Would patient like information on creating a medical advance directive? - No - Patient declined - No - Patient declined No - Patient declined - No - patient declined information  Pre-existing out of facility DNR order (yellow form or pink MOST form) - - - - - - -    Current Medications (verified) Outpatient Encounter Medications as of 07/11/2021  Medication Sig   aspirin 81 MG tablet Take 81 mg by mouth daily.   lisinopril (ZESTRIL) 40 MG tablet Take 1 tablet (40 mg total) by mouth daily.   meclizine (ANTIVERT) 25 MG tablet Take 1 tablet (25 mg total) by mouth 3 (three) times daily as needed for dizziness. (Patient not taking: Reported on 07/04/2021)   Multiple Vitamin (MULTIVITAMIN WITH MINERALS) TABS Take 1 tablet by mouth daily.   ondansetron (ZOFRAN) 4 MG tablet Take 1 tablet (4 mg total) by mouth every 8 (eight) hours as needed for nausea or vomiting. (Patient not taking: Reported on 07/04/2021)   rosuvastatin (CRESTOR) 40 MG tablet Take 1 tablet (40 mg total) by mouth daily.   No  facility-administered encounter medications on file as of 07/11/2021.    Allergies (verified) Patient has no known allergies.   History: Past Medical History:  Diagnosis Date   Anxiety    Arthritis    Chronic back pain    stenosis/spondylosis   History of bronchitis    as a child   History of colon polyps    History of kidney stones    Hyperlipidemia    takes Crestor daily   Hypertension    takes Metoprolol daily   PONV (postoperative nausea and vomiting)    Nausea with one surgery   Past Surgical History:  Procedure Laterality Date   COLONOSCOPY     COLONOSCOPY     several   COLONOSCOPY W/ POLYPECTOMY     COLONOSCOPY WITH PROPOFOL N/A 08/26/2018   Procedure: COLONOSCOPY WITH PROPOFOL;  Surgeon: WLucilla Lame MD;  Location: [facility] END[facility]COPY;  Service: Endoscopy;  Laterality: N/A;   cyst removed from under left arm     KNEE ARTHROSCOPY     right    KNEE ARTHROSCOPY W/ ACL RECONSTRUCTION     LUMBAR LAMINECTOMY/DECOMPRESSION MICRODISCECTOMY  08/11/2012   Procedure: LUMBAR LAMINECTOMY/DECOMPRESSION MICRODISCECTOMY 1 LEVEL;  Surgeon: RHosie Spangle MD;  [facility];  Service: Neurosurgery;  Laterality: Right;  RIGHT L45 laminotomy and microdiskectomy   LUMBAR LAMINECTOMY/DECOMPRESSION MICRODISCECTOMY Right 01/29/2015   Procedure: LUMBAR LAMINECTOMY/DECOMPRESSION  MICRODISCECTOMY 1 LEVEL;  Surgeon: Hosie Spangle, MD;  Location: [facility];  Service: Neurosurgery;  Laterality: Right;  Right L45 Laminotomy and microdisketomy   SHOULDER SURGERY     right    TONSILLECTOMY     Family History  Problem Relation Age of Onset   Hyperlipidemia Mother    Heart disease Mother    Congestive Heart Failure Mother    Colon cancer Father    Social History   Socioeconomic History   Marital status: Married    Spouse name: Not on file   Number of children: Not on file   Years of education: Not on file   Highest education level: Not on file  Occupational History     Comment: retired  Tobacco Use   Smoking status: Never   Smokeless tobacco: Never  Vaping Use   Vaping Use: Never used  Substance and Sexual Activity   Alcohol use: Yes    Alcohol/week: 16.0 standard drinks    Types: 14 Glasses of wine, 2 Shots of liquor per week   Drug use: No   Sexual activity: Yes  Other Topics Concern   Not on file  Social History Narrative   Not on file   Social Determinants of Health   Financial Resource Strain: Low Risk    Difficulty of Paying Living Expenses: Not hard at all  Food Insecurity: No Food Insecurity   Worried About Charity fundraiser in the Last Year: Never true   Ran Out of Food in the Last Year: Never true  Transportation Needs: No Transportation Needs   Lack of Transportation (Medical): No   Lack of Transportation (Non-Medical): No  Physical Activity: Insufficiently Active   Days of Exercise per Week: 7 days   Minutes of Exercise per Session: 20 min  Stress: No Stress Concern Present   Feeling of Stress : Not at all  Social Connections: Unknown   Frequency of Communication with Friends and Family: Not on file   Frequency of Social Gatherings with Friends and Family: Not on file   Attends Religious Services: Not on Electrical engineer or Organizations: Not on file   Attends Archivist Meetings: Not on file   Marital Status: Married    Tobacco Counseling Counseling given: Not Answered   Clinical Intake:  Pre-visit preparation completed: Yes        Diabetes: No  How often do you need to have someone help you when you read instructions, pamphlets, or other written materials from your doctor or pharmacy?: 1 - Never Interpreter Needed?: No      Activities of Daily Living In your present state of health, do you have any difficulty performing the following activities: 07/11/2021 04/13/2021  Hearing? N N  Vision? N Y  Comment - just for reading, left at home  Difficulty concentrating or making  decisions? N N  Walking or climbing stairs? Y N  Comment Chronic back pain. Drop foot. Paces self. -  Dressing or bathing? N N  Doing errands, shopping? N N  Preparing Food and eating ? N -  Using the Toilet? N -  In the past six months, have you accidently leaked urine? N -  Do you have problems with loss of bowel control? N -  Managing your Medications? N -  Managing your Finances? N -  Housekeeping or managing your Housekeeping? N -  Some recent data might be hidden    Patient Care Team: Caryl Bis,  Angela Adam, MD as PCP - General (Family Medicine)  Indicate any recent Medical Services you may have received from other than [facility] providers in the past year (date may be approximate).     Assessment:   This is a routine wellness examination for Ronald Hunter.  I connected with Ronald Hunter today by telephone and verified that I am speaking with the correct person using two identifiers. Location patient: home Location provider: work Persons participating in the virtual visit: patient, Marine scientist.    I discussed the limitations, risks, security and privacy concerns of performing an evaluation and management service by telephone and the availability of in person appointments. The patient expressed understanding and verbally consented to this telephonic visit.    Interactive audio and video telecommunications were attempted between this provider and patient, however failed, due to patient having technical difficulties OR patient did not have access to video capability.  We continued and completed visit with audio only.  Some vital signs may be absent or patient reported.   Hearing/Vision screen Hearing Screening - Comments:: Patient is able to hear conversational tones without difficulty.  No issues reported. Vision Screening - Comments:: Followed by [facility], Dr. George Ina Wears corrective lenses as needed     Dietary issues and exercise activities discussed: Current Exercise Habits: Home  exercise routine, Type of exercise: walking, Intensity: Mild Healthy diet  Good water intake   Goals Addressed               This Visit's Progress     Patient Stated     Follow up with Primary Care Provider (pt-stated)        Maintain Healthy Lifestyle Stay active Healthy diet       Depression Screen PHQ 2/9 Scores 07/11/2021 10/19/2020 07/08/2020 04/18/2020 10/19/2019 07/08/2019 07/29/2017  PHQ - 2 Score 0 0 0 0 0 0 0    Fall Risk Fall Risk  07/04/2021 10/19/2020 07/08/2020 04/18/2020 10/19/2019  Falls in the past year? 1 0 0 0 0  Number falls in past yr: 0 0 0 0 0  Follow up Falls evaluation completed Falls evaluation completed Falls evaluation completed Falls evaluation completed Falls evaluation completed    FALL RISK PREVENTION PERTAINING TO THE HOME: Adequate lighting in your home to reduce risk of falls? Yes   ASSISTIVE DEVICES UTILIZED TO PREVENT FALLS: Life alert? No  Use of a cane, walker or w/c? No   TIMED UP AND GO: Was the test performed? No .   Cognitive Function: Patient is alert and oriented x3.  Denies difficulty focusing, making decisions, memory loss.  Enjoys brain health activities.  MMSE/6CIT deferred. Normal by direct communication/observation.  MMSE - Mini Mental State Exam 07/08/2020  Not completed: Unable to complete        Immunizations Immunization History  Administered Date(s) Administered   Influenza, High Dose Seasonal PF 10/27/2018, 10/07/2019   Influenza,inj,Quad PF,6+ Mos 10/23/2017   Influenza-Unspecified 10/05/2020   PFIZER(Purple Top)SARS-COV-2 Vaccination 01/26/2020, 02/16/2020, 10/19/2020, 04/27/2021   Pneumococcal Conjugate-13 04/24/2018   Pneumococcal Polysaccharide-23 07/14/2019   Tdap 04/24/2018   Health Maintenance Health Maintenance  Topic Date Due   Zoster Vaccines- Shingrix (1 of 2) 10/11/2021 (Originally 02/10/2003)   INFLUENZA VACCINE  07/17/2021   COLONOSCOPY (Pts 45-58yr Insurance coverage will need to be  confirmed)  08/27/2023   TETANUS/TDAP  04/24/2028   COVID-19 Vaccine  Completed   Hepatitis C Screening  Completed   PNA vac Low Risk Adult  Completed   HPV VACCINES  Aged Out   Shingrix vaccine- plans to check with local pharmacy for date administered. Agrees to update immunization record.   DG Chest View 2 completed 04/12/21  Vision Screening: Recommended annual ophthalmology exams for early detection of glaucoma and other disorders of the eye.  Dental Screening: Recommended annual dental exams for proper oral hygiene  Community Resource Referral / Chronic Care Management: CRR required this visit?  No   CCM required this visit?  No      Plan:   Keep all routine maintenance appointments.   I have personally reviewed and noted the following in the patient's chart:   Medical and social history Use of alcohol, tobacco or illicit drugs  Current medications and supplements including opioid prescriptions. Patient is not currently taking opioid prescriptions. Functional ability and status Nutritional status Physical activity Advanced directives List of other physicians Hospitalizations, surgeries, and ER visits in previous 12 months Vitals Screenings to include cognitive, depression, and falls Referrals and appointments  In addition, I have reviewed and discussed with patient certain preventive protocols, quality metrics, and best practice recommendations. A written personalized care plan for preventive services as well as general preventive health recommendations were provided to patient via mychart.     Varney Biles, LPN   [phone]

## 2021-07-11 NOTE — Patient Instructions (Addendum)
[name] , Thank you for taking time to come for your Medicare Wellness Visit. I appreciate your ongoing commitment to your health goals. Please review the following plan we discussed and let me know if I can assist you in the future.   These are the goals we discussed:  Goals       Patient Stated     Follow up with Primary Care Provider (pt-stated)      Maintain Healthy Lifestyle Stay active Stay hydrated Healthy diet        This is a list of the screening recommended for you and due dates:  Health Maintenance  Topic Date Due   Zoster (Shingles) Vaccine (1 of 2) Never done   Flu Shot  07/17/2021   Colon Cancer Screening  08/27/2023   Tetanus Vaccine  04/24/2028   COVID-19 Vaccine  Completed   Hepatitis C Screening: USPSTF Recommendation to screen - Ages 18-79 yo.  Completed   Pneumonia vaccines  Completed   HPV Vaccine  Aged Out   Advanced directives:   Conditions/risks identified: none new  Follow up in one year for your annual wellness visit.   Preventive Care 68 Years and Older, Male Preventive care refers to lifestyle choices and visits with your health care provider that can promote health and wellness. What does preventive care include? A yearly physical exam. This is also called an annual well check. Dental exams once or twice a year. Routine eye exams. Ask your health care provider how often you should have your eyes checked. Personal lifestyle choices, including: Daily care of your teeth and gums. Regular physical activity. Eating a healthy diet. Avoiding tobacco and drug use. Limiting alcohol use. Practicing safe sex. Taking low doses of aspirin every day. Taking vitamin and mineral supplements as recommended by your health care provider. What happens during an annual well check? The services and screenings done by your health care provider during your annual well check will depend on your age, overall health, lifestyle risk factors, and family history  of disease. Counseling  Your health care provider may ask you questions about your: Alcohol use. Tobacco use. Drug use. Emotional well-being. Home and relationship well-being. Sexual activity. Eating habits. History of falls. Memory and ability to understand (cognition). Work and work Statistician. Screening  You may have the following tests or measurements: Height, weight, and BMI. Blood pressure. Lipid and cholesterol levels. These may be checked every 5 years, or more frequently if you are over 53 years old. Skin check. Lung cancer screening. You may have this screening every year starting at age 32 if you have a 30-pack-year history of smoking and currently smoke or have quit within the past 15 years. Fecal occult blood test (FOBT) of the stool. You may have this test every year starting at age 49. Flexible sigmoidoscopy or colonoscopy. You may have a sigmoidoscopy every 5 years or a colonoscopy every 10 years starting at age 81. Prostate cancer screening. Recommendations will vary depending on your family history and other risks. Hepatitis C blood test. Hepatitis B blood test. Sexually transmitted disease (STD) testing. Diabetes screening. This is done by checking your blood sugar (glucose) after you have not eaten for a while (fasting). You may have this done every 1-3 years. Abdominal aortic aneurysm (AAA) screening. You may need this if you are a current or former smoker. Osteoporosis. You may be screened starting at age 100 if you are at high risk. Talk with your health care provider about your  test results, treatment options, and if necessary, the need for more tests. Vaccines  Your health care provider may recommend certain vaccines, such as: Influenza vaccine. This is recommended every year. Tetanus, diphtheria, and acellular pertussis (Tdap, Td) vaccine. You may need a Td booster every 10 years. Zoster vaccine. You may need this after age 56. Pneumococcal 13-valent  conjugate (PCV13) vaccine. One dose is recommended after age 27. Pneumococcal polysaccharide (PPSV23) vaccine. One dose is recommended after age 59. Talk to your health care provider about which screenings and vaccines you need and how often you need them. This information is not intended to replace advice given to you by your health care provider. Make sure you discuss any questions you have with your health care provider. Document Released: 12/30/2015 Document Revised: 08/22/2016 Document Reviewed: 10/04/2015 Elsevier Interactive Patient Education  2017 [employer] Prevention in the Home Falls can cause injuries. They can happen to people of all ages. There are many things you can do to make your home safe and to help prevent falls. What can I do on the outside of my home? Regularly fix the edges of walkways and driveways and fix any cracks. Remove anything that might make you trip as you walk through a door, such as a raised step or threshold. Trim any bushes or trees on the path to your home. Use bright outdoor lighting. Clear any walking paths of anything that might make someone trip, such as rocks or tools. Regularly check to see if handrails are loose or broken. Make sure that both sides of any steps have handrails. Any raised decks and porches should have guardrails on the edges. Have any leaves, snow, or ice cleared regularly. Use sand or salt on walking paths during winter. Clean up any spills in your garage right away. This includes oil or grease spills. What can I do in the bathroom? Use night lights. Install grab bars by the toilet and in the tub and shower. Do not use towel bars as grab bars. Use non-skid mats or decals in the tub or shower. If you need to sit down in the shower, use a plastic, non-slip stool. Keep the floor dry. Clean up any water that spills on the floor as soon as it happens. Remove soap buildup in the tub or shower regularly. Attach bath mats  securely with double-sided non-slip rug tape. Do not have throw rugs and other things on the floor that can make you trip. What can I do in the bedroom? Use night lights. Make sure that you have a light by your bed that is easy to reach. Do not use any sheets or blankets that are too big for your bed. They should not hang down onto the floor. Have a firm chair that has side arms. You can use this for support while you get dressed. Do not have throw rugs and other things on the floor that can make you trip. What can I do in the kitchen? Clean up any spills right away. Avoid walking on wet floors. Keep items that you use a lot in easy-to-reach places. If you need to reach something above you, use a strong step stool that has a grab bar. Keep electrical cords out of the way. Do not use floor polish or wax that makes floors slippery. If you must use wax, use non-skid floor wax. Do not have throw rugs and other things on the floor that can make you trip. What can I do with my  stairs? Do not leave any items on the stairs. Make sure that there are handrails on both sides of the stairs and use them. Fix handrails that are broken or loose. Make sure that handrails are as long as the stairways. Check any carpeting to make sure that it is firmly attached to the stairs. Fix any carpet that is loose or worn. Avoid having throw rugs at the top or bottom of the stairs. If you do have throw rugs, attach them to the floor with carpet tape. Make sure that you have a light switch at the top of the stairs and the bottom of the stairs. If you do not have them, ask someone to add them for you. What else can I do to help prevent falls? Wear shoes that: Do not have high heels. Have rubber bottoms. Are comfortable and fit you well. Are closed at the toe. Do not wear sandals. If you use a stepladder: Make sure that it is fully opened. Do not climb a closed stepladder. Make sure that both sides of the stepladder  are locked into place. Ask someone to hold it for you, if possible. Clearly mark and make sure that you can see: Any grab bars or handrails. First and last steps. Where the edge of each step is. Use tools that help you move around (mobility aids) if they are needed. These include: Canes. Walkers. Scooters. Crutches. Turn on the lights when you go into a dark area. Replace any light bulbs as soon as they burn out. Set up your furniture so you have a clear path. Avoid moving your furniture around. If any of your floors are uneven, fix them. If there are any pets around you, be aware of where they are. Review your medicines with your doctor. Some medicines can make you feel dizzy. This can increase your chance of falling. Ask your doctor what other things that you can do to help prevent falls. This information is not intended to replace advice given to you by your health care provider. Make sure you discuss any questions you have with your health care provider. Document Released: 09/29/2009 Document Revised: 05/10/2016 Document Reviewed: 01/07/2015 Elsevier Interactive Patient Education  2017 [employer].

## 2021-09-12 ENCOUNTER — Encounter: Payer: Self-pay | Admitting: Family Medicine

## 2021-09-14 MED ORDER — GABAPENTIN 100 MG PO CAPS: 100.0000 mg | ORAL_CAPSULE | Freq: Every day | ORAL | 0 refills | Status: DC

## 2021-09-27 ENCOUNTER — Ambulatory Visit (INDEPENDENT_AMBULATORY_CARE_PROVIDER_SITE_OTHER): Payer: Medicare Other | Admitting: Dermatology

## 2021-09-27 ENCOUNTER — Other Ambulatory Visit: Payer: Self-pay

## 2021-09-27 DIAGNOSIS — L820 Inflamed seborrheic keratosis: Secondary | ICD-10-CM | POA: Diagnosis not present

## 2021-09-27 DIAGNOSIS — L578 Other skin changes due to chronic exposure to nonionizing radiation: Secondary | ICD-10-CM | POA: Diagnosis not present

## 2021-09-27 DIAGNOSIS — L821 Other seborrheic keratosis: Secondary | ICD-10-CM

## 2021-09-27 NOTE — Patient Instructions (Signed)

## 2021-09-27 NOTE — Progress Notes (Signed)
   New Patient Visit  Subjective  Ronald Hunter is a 70 y.o. male who presents for the following: Lesion  (On the scalp - changing is size and appearance; lesion on the back - irregular appearing, patient would like checked today).  The following portions of the chart were reviewed this encounter and updated as appropriate:   Tobacco  Allergies  Meds  Problems  Med Hx  Surg Hx  Fam Hx     Review of Systems:  No other skin or systemic complaints except as noted in HPI or Assessment and Plan.  Objective  Well appearing patient in no apparent distress; mood and affect are within normal limits.  A focused examination was performed including the face, scalp, and back. Relevant physical exam findings are noted in the Assessment and Plan.  R scalp x 1, R post shoulder x 2, R clavicle x 1 (4) Erythematous keratotic or waxy stuck-on papule or plaque.    Assessment & Plan  Inflamed seborrheic keratosis R scalp x 1, R post shoulder x 2, R clavicle x 1  Destruction of lesion - R scalp x 1, R post shoulder x 2, R clavicle x 1 Complexity: simple   Destruction method: cryotherapy   Informed consent: discussed and consent obtained   Timeout:  patient name, date of birth, surgical site, and procedure verified Lesion destroyed using liquid nitrogen: Yes   Region frozen until ice ball extended beyond lesion: Yes   Outcome: patient tolerated procedure well with no complications   Post-procedure details: wound care instructions given    Seborrheic Keratoses - Stuck-on, waxy, tan-brown papules and/or plaques  - Benign-appearing - Discussed benign etiology and prognosis. - Observe - Call for any changes  Actinic Damage - chronic, secondary to cumulative UV radiation exposure/sun exposure over time - diffuse scaly erythematous macules with underlying dyspigmentation - Recommend daily broad spectrum sunscreen SPF 30+ to sun-exposed areas, reapply every 2 hours as needed.  - Recommend staying  in the shade or wearing long sleeves, sun glasses (UVA+UVB protection) and wide brim hats (4-inch brim around the entire circumference of the hat). - Call for new or changing lesions.  Return for ISK recheck in 6-8 weeks.  Luther Redo, CMA, am acting as scribe for Sarina Ser, MD . Documentation: I have reviewed the above documentation for accuracy and completeness, and I agree with the above.  Sarina Ser, MD

## 2021-09-28 ENCOUNTER — Encounter: Payer: Self-pay | Admitting: Dermatology

## 2021-10-13 ENCOUNTER — Other Ambulatory Visit: Payer: Self-pay | Admitting: Family Medicine

## 2021-10-13 DIAGNOSIS — E785 Hyperlipidemia, unspecified: Secondary | ICD-10-CM

## 2021-10-17 DIAGNOSIS — Z23 Encounter for immunization: Secondary | ICD-10-CM | POA: Diagnosis not present

## 2021-11-06 ENCOUNTER — Encounter: Payer: Self-pay | Admitting: Family Medicine

## 2021-11-06 ENCOUNTER — Other Ambulatory Visit: Payer: Self-pay

## 2021-11-06 ENCOUNTER — Ambulatory Visit (INDEPENDENT_AMBULATORY_CARE_PROVIDER_SITE_OTHER): Payer: Medicare Other | Admitting: Family Medicine

## 2021-11-06 VITALS — BP 130/80 | HR 68 | Temp 97.9°F | Ht 72.0 in | Wt 241.4 lb

## 2021-11-06 DIAGNOSIS — I10 Essential (primary) hypertension: Secondary | ICD-10-CM | POA: Diagnosis not present

## 2021-11-06 DIAGNOSIS — M545 Low back pain, unspecified: Secondary | ICD-10-CM

## 2021-11-06 DIAGNOSIS — Z125 Encounter for screening for malignant neoplasm of prostate: Secondary | ICD-10-CM | POA: Diagnosis not present

## 2021-11-06 DIAGNOSIS — E785 Hyperlipidemia, unspecified: Secondary | ICD-10-CM

## 2021-11-06 DIAGNOSIS — H8113 Benign paroxysmal vertigo, bilateral: Secondary | ICD-10-CM

## 2021-11-06 DIAGNOSIS — G8929 Other chronic pain: Secondary | ICD-10-CM

## 2021-11-06 NOTE — Assessment & Plan Note (Signed)
No significant recurrence.  He will monitor.  He will continue his physical therapy exercises when he does get a recurrence.

## 2021-11-06 NOTE — Patient Instructions (Signed)
Nice to see you. We will get labs.

## 2021-11-06 NOTE — Assessment & Plan Note (Signed)
Adequate control.  He will continue lisinopril 40 mg once daily.  We will check a BMP.

## 2021-11-06 NOTE — Progress Notes (Signed)
[name], MD Phone: [phone]  Ronald Hunter is a 70 y.o. male who presents today for f/u.  HYPERTENSION Disease Monitoring Home BP Monitoring similar or better Chest pain- no    Dyspnea- no Medications Compliance-  taking lisinopril.  Edema- no BMET    Component Value Date/Time   NA 140 04/24/2021 1219   NA 140 06/16/2012 1449   K 4.2 04/24/2021 1219   K 3.8 06/16/2012 1449   CL 105 04/24/2021 1219   CL 106 06/16/2012 1449   CO2 25 04/24/2021 1219   CO2 25 06/16/2012 1449   GLUCOSE 97 04/24/2021 1219   GLUCOSE 103 (H) 06/16/2012 1449   BUN 11 04/24/2021 1219   BUN 15 06/16/2012 1449   CREATININE 0.62 (L) 04/24/2021 1219   CALCIUM 9.3 04/24/2021 1219   CALCIUM 8.7 06/16/2012 1449   GFRNONAA >60 04/15/2021 0454   GFRNONAA >60 06/16/2012 1449   GFRAA >90 01/28/2015 1150   GFRAA >60 06/16/2012 1449   HYPERLIPIDEMIA Symptoms Chest pain on exertion:  no   Medications: Compliance- taking crestor Right upper quadrant pain- no  Muscle aches- no Lipid Panel     Component Value Date/Time   CHOL 149 04/13/2021 0347   TRIG 46 04/13/2021 0347   HDL 43 04/13/2021 0347   CHOLHDL 3.5 04/13/2021 0347   VLDL 9 04/13/2021 0347   LDLCALC 97 04/13/2021 0347   LDLCALC 105 (H) 10/19/2020 0902   LDLDIRECT 116 (H) 04/18/2020 0831   Chronic back pain/nerve pain: Patient notes that the pain only bothers him at night.  He had a couple nights where the pain was really bad so he took one of his wife's gabapentin.  Notes this was quite beneficial.  He was subsequently prescribed gabapentin 100 mg once nightly by me.  He only takes it if he really needs it.  He is taking less than 10 doses since this was prescribed.  No drowsiness with this.  Describes the discomfort as pins-and-needles typically in his foot on the right side.  He reports chronic right foot drop as well.  Vertigo: No significant recurrence.  Notes rarely gets just the start of the vertiginous symptoms if he shifts his  head one direction quickly.  Social History   Tobacco Use  Smoking Status Never  Smokeless Tobacco Never    Current Outpatient Medications on File Prior to Visit  Medication Sig Dispense Refill   aspirin 81 MG tablet Take 81 mg by mouth daily.     gabapentin (NEURONTIN) 100 MG capsule Take 1 capsule (100 mg total) by mouth at bedtime. 90 capsule 0   lisinopril (ZESTRIL) 40 MG tablet Take 1 tablet (40 mg total) by mouth daily. 90 tablet 3   Multiple Vitamin (MULTIVITAMIN WITH MINERALS) TABS Take 1 tablet by mouth daily.     rosuvastatin (CRESTOR) 40 MG tablet Take 1 tablet by mouth once daily 90 tablet 3   No current facility-administered medications on file prior to visit.     ROS see history of present illness  Objective  Physical Exam Vitals:   11/06/21 0810  BP: 130/80  Pulse: 68  Temp: 97.9 F (36.6 C)  SpO2: 99%    BP Readings from Last 3 Encounters:  11/06/21 130/80  07/04/21 130/80  04/24/21 130/80   Wt Readings from Last 3 Encounters:  11/06/21 241 lb 6.4 oz (109.5 kg)  07/11/21 240 lb (108.9 kg)  07/04/21 240 lb 3.2 oz (109 kg)    Physical Exam Constitutional:  General: He is not in acute distress.    Appearance: He is not diaphoretic.  Cardiovascular:     Rate and Rhythm: Normal rate and regular rhythm.     Heart sounds: Normal heart sounds.  Pulmonary:     Effort: Pulmonary effort is normal.     Breath sounds: Normal breath sounds.  Skin:    General: Skin is warm and dry.  Neurological:     Mental Status: He is alert.     Comments: Right foot dorsiflexion 4+/5 strength, 5/5 strength left dorsiflexion, bilateral plantar flexion, quads, and hamstrings, sensation to light touch intact bilateral lower extremities     Assessment/Plan: Please see individual problem list.  Problem List Items Addressed This Visit     Benign paroxysmal positional vertigo due to bilateral vestibular disorder    No significant recurrence.  He will monitor.  He  will continue his physical therapy exercises when he does get a recurrence.      Chronic low back pain    Chronic issue.  Neuropathy has been a little worse.  The patient will continue gabapentin 100 mg by mouth at bedtime as needed.  He will monitor for drowsiness with this.      Essential hypertension - Primary    Adequate control.  He will continue lisinopril 40 mg once daily.  We will check a BMP.      Relevant Orders   Basic Metabolic Panel (BMET)   Comprehensive metabolic panel   Hyperlipidemia    Continue Crestor 40 mg once daily.      Relevant Orders   Lipid panel   Prostate cancer screening   Other Visit Diagnoses     Special screening for malignant neoplasm of prostate       Relevant Orders   PSA      ABN signed to have PSA completed. Patient noted we had the same conversation last year and he did not have to pay anything.    Return in about 6 months (around 05/06/2022) for Hypertension/hyperlipidemia, fasting labs 2 days ahead of time.  This visit occurred during the SARS-CoV-2 public health emergency.  Safety protocols were in place, including screening questions prior to the visit, additional usage of staff PPE, and extensive cleaning of exam room while observing appropriate contact time as indicated for disinfecting solutions.    [name], MD [place]

## 2021-11-06 NOTE — Assessment & Plan Note (Signed)
Chronic issue.  Neuropathy has been a little worse.  The patient will continue gabapentin 100 mg by mouth at bedtime as needed.  He will monitor for drowsiness with this.

## 2021-11-06 NOTE — Assessment & Plan Note (Signed)
Continue Crestor 40 mg once daily. 

## 2021-11-07 LAB — BASIC METABOLIC PANEL
BUN: 13 mg/dL (ref 7–25)
CO2: 26 mmol/L (ref 20–32)
Calcium: 9 mg/dL (ref 8.6–10.3)
Chloride: 105 mmol/L (ref 98–110)
Creat: 0.7 mg/dL (ref 0.70–1.35)
Glucose, Bld: 98 mg/dL (ref 65–99)
Potassium: 4.7 mmol/L (ref 3.5–5.3)
Sodium: 139 mmol/L (ref 135–146)

## 2021-11-07 LAB — PSA: PSA: 3.47 ng/mL (ref <=4.00)

## 2021-11-15 ENCOUNTER — Ambulatory Visit (INDEPENDENT_AMBULATORY_CARE_PROVIDER_SITE_OTHER): Payer: Medicare Other | Admitting: Dermatology

## 2021-11-15 ENCOUNTER — Other Ambulatory Visit: Payer: Self-pay

## 2021-11-15 DIAGNOSIS — L219 Seborrheic dermatitis, unspecified: Secondary | ICD-10-CM

## 2021-11-15 DIAGNOSIS — D225 Melanocytic nevi of trunk: Secondary | ICD-10-CM

## 2021-11-15 DIAGNOSIS — D485 Neoplasm of uncertain behavior of skin: Secondary | ICD-10-CM

## 2021-11-15 DIAGNOSIS — L719 Rosacea, unspecified: Secondary | ICD-10-CM | POA: Diagnosis not present

## 2021-11-15 DIAGNOSIS — L820 Inflamed seborrheic keratosis: Secondary | ICD-10-CM | POA: Diagnosis not present

## 2021-11-15 DIAGNOSIS — D239 Other benign neoplasm of skin, unspecified: Secondary | ICD-10-CM

## 2021-11-15 DIAGNOSIS — D229 Melanocytic nevi, unspecified: Secondary | ICD-10-CM

## 2021-11-15 DIAGNOSIS — D492 Neoplasm of unspecified behavior of bone, soft tissue, and skin: Secondary | ICD-10-CM

## 2021-11-15 HISTORY — DX: Other benign neoplasm of skin, unspecified: D23.9

## 2021-11-15 MED ORDER — KETOCONAZOLE 2 % EX CREA: 1.0000 "application " | TOPICAL_CREAM | CUTANEOUS | 11 refills | Status: AC

## 2021-11-15 MED ORDER — HYDROCORTISONE 2.5 % EX CREA: TOPICAL_CREAM | CUTANEOUS | 6 refills | Status: AC

## 2021-11-15 NOTE — Patient Instructions (Addendum)

## 2021-11-15 NOTE — Progress Notes (Signed)
Follow-Up Visit   Subjective  Ronald Hunter is a 70 y.o. male who presents for the following: ISK f/u (One on back didn't resolve, 8wk f/u) and Upper body skin exam. The patient presents for Upper Body Skin Exam (UBSE) for skin cancer screening and mole check.  The patient has spots, moles and lesions to be evaluated, some may be new or changing and the patient has concerns that these could be cancer.  The following portions of the chart were reviewed this encounter and updated as appropriate:   Tobacco  Allergies  Meds  Problems  Med Hx  Surg Hx  Fam Hx     Review of Systems:  No other skin or systemic complaints except as noted in HPI or Assessment and Plan.  Objective  Well appearing patient in no apparent distress; mood and affect are within normal limits.  All skin waist up examined.  R post shoulder 0.7cm actively bleeding keratotic pap     L mid to low back paraspinal, 2.0 cm lat to spine 1.2cm irregular brown macule       L mid to low back lateral, 11.0cm lat to spina 0.8cm irregular brown macule       R scalp x 1, L temporal within hairline x 1 Total = 2 Residual rrythematous keratotic waxy stuck-on papule or plaque.   face Erythema face  face Pink patches with greasy scale.   back Brown macules back some slightly irregular      Assessment & Plan   Melanocytic Nevi - Tan-brown and/or pink-flesh-colored symmetric macules and papules - Benign appearing on exam today - Observation - Call clinic for new or changing moles - Recommend daily use of broad spectrum spf 30+ sunscreen to sun-exposed areas.    Neoplasm of skin (3) R post shoulder Epidermal / dermal shaving  Lesion diameter (cm):  0.7 Informed consent: discussed and consent obtained   Timeout: patient name, date of birth, surgical site, and procedure verified   Procedure prep:  Patient was prepped and draped in usual sterile fashion Prep type:  Isopropyl alcohol Anesthesia:  the lesion was anesthetized in a standard fashion   Anesthetic:  1% lidocaine w/ epinephrine 1-100,000 buffered w/ 8.4% NaHCO3 Instrument used: flexible razor blade   Hemostasis achieved with: pressure, aluminum chloride and electrodesiccation   Outcome: patient tolerated procedure well   Post-procedure details: sterile dressing applied and wound care instructions given   Dressing type: bandage and bacitracin    Specimen 1 - Surgical pathology Differential Diagnosis: D48.5 ISK vs other  Check Margins: No 0.7cm actively bleeding keratotic pap  L mid to low back paraspinal, 2.0 cm lat to spine Epidermal / dermal shaving  Lesion diameter (cm):  1.2 Informed consent: discussed and consent obtained   Timeout: patient name, date of birth, surgical site, and procedure verified   Procedure prep:  Patient was prepped and draped in usual sterile fashion Prep type:  Isopropyl alcohol Anesthesia: the lesion was anesthetized in a standard fashion   Anesthetic:  1% lidocaine w/ epinephrine 1-100,000 buffered w/ 8.4% NaHCO3 Instrument used: flexible razor blade   Hemostasis achieved with: pressure, aluminum chloride and electrodesiccation   Outcome: patient tolerated procedure well   Post-procedure details: sterile dressing applied and wound care instructions given   Dressing type: bandage and bacitracin    Specimen 2 - Surgical pathology Differential Diagnosis: D48.5 Nevus vs Dysplastic Nevus  Check Margins: yes 1.2cm irregular brown macule  L mid to low back lateral, 11.0cm  lat to spine Epidermal / dermal shaving  Lesion diameter (cm):  0.8 Informed consent: discussed and consent obtained   Timeout: patient name, date of birth, surgical site, and procedure verified   Procedure prep:  Patient was prepped and draped in usual sterile fashion Prep type:  Isopropyl alcohol Anesthesia: the lesion was anesthetized in a standard fashion   Anesthetic:  1% lidocaine w/ epinephrine 1-100,000  buffered w/ 8.4% NaHCO3 Instrument used: flexible razor blade   Hemostasis achieved with: pressure, aluminum chloride and electrodesiccation   Outcome: patient tolerated procedure well   Post-procedure details: sterile dressing applied and wound care instructions given   Dressing type: bandage and bacitracin    Specimen 3 - Surgical pathology Differential Diagnosis: D48.5 Nevus vs Dysplastic Nevus  Check Margins: yes 0.8cm irregular brown macule  Inflamed seborrheic keratosis R scalp x 1, L temporal within hairline x 1 Total = 2 Destruction of lesion - R scalp x 1, L temporal within hairline x 1 Total = 2 Complexity: simple   Destruction method: cryotherapy   Informed consent: discussed and consent obtained   Timeout:  patient name, date of birth, surgical site, and procedure verified Lesion destroyed using liquid nitrogen: Yes   Region frozen until ice ball extended beyond lesion: Yes   Outcome: patient tolerated procedure well with no complications   Post-procedure details: wound care instructions given    Rosacea face Rosacea is a chronic progressive skin condition usually affecting the face of adults, causing redness and/or acne bumps. It is treatable but not curable. It sometimes affects the eyes (ocular rosacea) as well. It may respond to topical and/or systemic medication and can flare with stress, sun exposure, alcohol, exercise and some foods.  Daily application of broad spectrum spf 30+ sunscreen to face is recommended to reduce flares. Will initiate treatment at next visit.  Seborrheic dermatitis face Seborrheic Dermatitis  -  is a chronic persistent rash characterized by pinkness and scaling most commonly of the mid face but also can occur on the scalp (dandruff), ears; mid chest, mid back and groin.  It tends to be exacerbated by stress and cooler weather.  People who have neurologic disease may experience new onset or exacerbation of existing seborrheic dermatitis.  The  condition is not curable but treatable and can be controlled.  Start Ketoconazole 2% cr 3x/wk, Monday, Wednesday, Friday Start HC 2.5% cr 3x/wk Tuesday, Thursday and Saturday  Topical steroids (such as triamcinolone, fluocinolone, fluocinonide, mometasone, clobetasol, halobetasol, betamethasone, hydrocortisone) can cause thinning and lightening of the skin if they are used for too long in the same area. Your physician has selected the right strength medicine for your problem and area affected on the body. Please use your medication only as directed by your physician to prevent side effects.    ketoconazole (NIZORAL) 2 % cream - face Apply 1 application topically 3 (three) times a week. Apply to scaly areas on face 3 nights weekly, Monday, Wednesday and Fridays  hydrocortisone 2.5 % cream - face Apply topically 3 (three) times a week. Apply to scaly areas on face 3 nights weekly, Tuesday, Thursday and Saturdays  Nevus many slightly irregular See photos back Benign-appearing.  Observation.  Call clinic for new or changing moles.  Recommend daily use of broad spectrum spf 30+ sunscreen to sun-exposed areas.    Return in about 1 year (around 11/15/2022) for UBSE.  I, [name], RMA, am acting as scribe for Sarina Ser, MD . Documentation: I have reviewed the above  documentation for accuracy and completeness, and I agree with the above.  Sarina Ser, MD

## 2021-11-21 ENCOUNTER — Encounter: Payer: Self-pay | Admitting: Dermatology

## 2021-11-23 ENCOUNTER — Telehealth: Payer: Self-pay

## 2021-11-23 NOTE — Telephone Encounter (Signed)
-----   Message from Ralene Bathe, MD sent at 11/18/2021  1:03 PM EST ----- Diagnosis 1. Skin , right post shoulder SEBORRHEIC KERATOSIS, INFLAMED 2. Skin , left mid to low back paraspinal, 2.0 cm lat to spine DYSPLASTIC COMPOUND NEVUS WITH SEVERE ATYPIA, PERIPHERAL MARGIN INVOLVED, SEE DESCRIPTION 3. Skin , left mid to low back lateral, 11.0cm lat to spine DYSPLASTIC COMPOUND NEVUS WITH MODERATE ATYPIA, PERIPHERAL AND DEEP MARGINS INVOLVED  1- benign inflamed keratosis No further treatment needed 2- Severe dysplastic Schedule surgery 3- moderate dysplastic Recheck next visit

## 2021-11-23 NOTE — Telephone Encounter (Signed)
Advised pt of bx results and scheduled pt for surgery./sh 

## 2022-01-16 ENCOUNTER — Other Ambulatory Visit: Payer: Self-pay

## 2022-01-16 ENCOUNTER — Ambulatory Visit (INDEPENDENT_AMBULATORY_CARE_PROVIDER_SITE_OTHER): Payer: Medicare Other | Admitting: Dermatology

## 2022-01-16 ENCOUNTER — Encounter: Payer: Self-pay | Admitting: Dermatology

## 2022-01-16 DIAGNOSIS — Z86018 Personal history of other benign neoplasm: Secondary | ICD-10-CM | POA: Diagnosis not present

## 2022-01-16 DIAGNOSIS — D239 Other benign neoplasm of skin, unspecified: Secondary | ICD-10-CM

## 2022-01-16 DIAGNOSIS — D225 Melanocytic nevi of trunk: Secondary | ICD-10-CM | POA: Diagnosis not present

## 2022-01-16 DIAGNOSIS — D229 Melanocytic nevi, unspecified: Secondary | ICD-10-CM

## 2022-01-16 DIAGNOSIS — L988 Other specified disorders of the skin and subcutaneous tissue: Secondary | ICD-10-CM | POA: Diagnosis not present

## 2022-01-16 DIAGNOSIS — D235 Other benign neoplasm of skin of trunk: Secondary | ICD-10-CM | POA: Diagnosis not present

## 2022-01-16 MED ORDER — MUPIROCIN 2 % EX OINT: 1.0000 "application " | TOPICAL_OINTMENT | Freq: Every day | CUTANEOUS | 0 refills | Status: DC

## 2022-01-16 NOTE — Progress Notes (Signed)
Follow-Up Visit   Subjective  Ronald Hunter is a 71 y.o. male who presents for the following: bx proven Severe dysplastic nevus (L mid to low back paraspinal, 2.0cm lat to spine, pt presents for excision).  The following portions of the chart were reviewed this encounter and updated as appropriate:   Tobacco   Allergies   Meds   Problems   Med Hx   Surg Hx   Fam Hx      Review of Systems:  No other skin or systemic complaints except as noted in HPI or Assessment and Plan.  Objective  Well appearing patient in no apparent distress; mood and affect are within normal limits.  A focused examination was performed including back. Relevant physical exam findings are noted in the Assessment and Plan.  L mid to low back parspinal, 2.0cm lat to spine Pink bx site 1.8 x 1.0cm  L mid to low back lateral, 11.0cm lat to spine Pink bx site  back Slightly irregular brown macules back   Assessment & Plan  Dysplastic nevus - severe - biopsy proven L mid to low back parspinal, 2.0cm lat to spine  Excised today  Start Mupirocin oint qd to excision site  Skin excision - L mid to low back parspinal, 2.0cm lat to spine  Lesion length (cm):  1.8 Lesion width (cm):  1 Margin per side (cm):  0.2 Total excision diameter (cm):  2.2 Informed consent: discussed and consent obtained   Timeout: patient name, date of birth, surgical site, and procedure verified   Procedure prep:  Patient was prepped and draped in usual sterile fashion Prep type:  Isopropyl alcohol and povidone-iodine Anesthesia: the lesion was anesthetized in a standard fashion   Anesthetic:  1% lidocaine w/ epinephrine 1-100,000 buffered w/ 8.4% NaHCO3 (6.0cc lido w/ epi, 6.0cc bupivicaine, Total 12.0cc) Instrument used: #15 blade   Hemostasis achieved with: pressure   Hemostasis achieved with comment:  Electrocautery Outcome: patient tolerated procedure well with no complications   Post-procedure details: sterile dressing applied  and wound care instructions given   Dressing type: pressure dressing and bacitracin (Mupirocin)    Skin repair - L mid to low back parspinal, 2.0cm lat to spine Complexity:  Complex Final length (cm):  5 Reason for type of repair: reduce tension to allow closure, reduce the risk of dehiscence, infection, and necrosis, reduce subcutaneous dead space and avoid a hematoma, allow closure of the large defect, preserve normal anatomy, preserve normal anatomical and functional relationships and enhance both functionality and cosmetic results   Undermining: area extensively undermined   Undermining comment:  Undermining Defect 2.2cm Subcutaneous layers (deep stitches):  Suture size:  2-0 Suture type: Vicryl (polyglactin 910)   Subcutaneous suture technique: Inverted Dermal. Fine/surface layer approximation (top stitches):  Suture size:  2-0 Suture type: nylon   Stitches: simple running   Suture removal (days):  7 Hemostasis achieved with: pressure Outcome: patient tolerated procedure well with no complications   Post-procedure details: sterile dressing applied and wound care instructions given   Dressing type: pressure dressing and bacitracin (Mupirocin)    mupirocin ointment (BACTROBAN) 2 % - L mid to low back parspinal, 2.0cm lat to spine Apply 1 application topically daily. Qd to excision site  Specimen 1 - Surgical pathology Differential Diagnosis: D48.5 Bx proven Severe Dysplastic Nevus  Check Margins: yes Pink bx site 1.8 x 1.0cm  History of dysplastic nevus L mid to low back lateral, 11.0cm lat to spine Bx proven Clear.  Observe for recurrence. Call clinic for new or changing lesions.  Recommend regular skin exams, daily broad-spectrum spf 30+ sunscreen use, and photoprotection.    Nevus - irregular back Will plan biopsies on f/u  Return in about 1 week (around 01/23/2022) for suture removal, and bx's.  I, [name], RMA, am acting as scribe for Sarina Ser, MD  . Documentation: I have reviewed the above documentation for accuracy and completeness, and I agree with the above.  Sarina Ser, MD

## 2022-01-16 NOTE — Patient Instructions (Signed)

## 2022-01-17 ENCOUNTER — Telehealth: Payer: Self-pay

## 2022-01-17 ENCOUNTER — Encounter: Payer: Self-pay | Admitting: Dermatology

## 2022-01-17 NOTE — Telephone Encounter (Signed)
Left pt message to call if any problems after yesterdays surgery./sh 

## 2022-01-23 ENCOUNTER — Other Ambulatory Visit: Payer: Self-pay

## 2022-01-23 ENCOUNTER — Encounter: Payer: Self-pay | Admitting: Dermatology

## 2022-01-23 ENCOUNTER — Ambulatory Visit (INDEPENDENT_AMBULATORY_CARE_PROVIDER_SITE_OTHER): Payer: Medicare Other | Admitting: Dermatology

## 2022-01-23 DIAGNOSIS — Z4802 Encounter for removal of sutures: Secondary | ICD-10-CM | POA: Diagnosis not present

## 2022-01-23 DIAGNOSIS — D225 Melanocytic nevi of trunk: Secondary | ICD-10-CM

## 2022-01-23 DIAGNOSIS — Z86018 Personal history of other benign neoplasm: Secondary | ICD-10-CM | POA: Diagnosis not present

## 2022-01-23 DIAGNOSIS — D485 Neoplasm of uncertain behavior of skin: Secondary | ICD-10-CM

## 2022-01-23 NOTE — Patient Instructions (Signed)

## 2022-01-23 NOTE — Progress Notes (Signed)
Follow-Up Visit   Subjective  Ronald Hunter is a 71 y.o. male who presents for the following: Suture removal/post op (left mid to low back paraspinal, 2.0 cm lat to spine - pathology proven margins free severely dysplastic nevus ) and Irregular nevi (On the back - patient is here for biopsies today).  The following portions of the chart were reviewed this encounter and updated as appropriate:   Tobacco   Allergies   Meds   Problems   Med Hx   Surg Hx   Fam Hx      Review of Systems:  No other skin or systemic complaints except as noted in HPI or Assessment and Plan.  Objective  Well appearing patient in no apparent distress; mood and affect are within normal limits.  A focused examination was performed including the trunk. Relevant physical exam findings are noted in the Assessment and Plan.  left mid to low back paraspinal, 2.0 cm lat to spine Healing excision site.  R infrascapular 0.7 cm irregular brown macule.  L mid back 6.0 cm lat to spine 0.8 cm irregular brown macule.  L mid back 7.0 cm lat to spine and inf to L mid back 6.0 cm lat to spine 1.1 cm irregular brown macule.      Assessment & Plan  History of dysplastic nevus left mid to low back paraspinal, 2.0 cm lat to spine  Encounter for Removal of Sutures - Incision site at the left mid to low back paraspinal, 2.0 cm lat to spine is clean, dry and intact - Wound cleansed, sutures removed, wound cleansed and steri strips applied.  - Discussed pathology results showing margins free severely dysplastic nevus.  - Patient advised to keep steri-strips dry until they fall off. - Scars remodel for a full year. - Once steri-strips fall off, patient can apply over-the-counter silicone scar cream each night to help with scar remodeling if desired. - Patient advised to call with any concerns or if they notice any new or changing lesions.   Neoplasm of uncertain behavior of skin (3) R infrascapular  Epidermal / dermal  shaving  Lesion diameter (cm):  0.7 Informed consent: discussed and consent obtained   Timeout: patient name, date of birth, surgical site, and procedure verified   Procedure prep:  Patient was prepped and draped in usual sterile fashion Prep type:  Isopropyl alcohol Anesthesia: the lesion was anesthetized in a standard fashion   Anesthetic:  1% lidocaine w/ epinephrine 1-100,000 buffered w/ 8.4% NaHCO3 Instrument used: flexible razor blade   Hemostasis achieved with: pressure, aluminum chloride and electrodesiccation   Outcome: patient tolerated procedure well   Post-procedure details: sterile dressing applied and wound care instructions given   Dressing type: bandage and petrolatum    Specimen 1 - Surgical pathology Differential Diagnosis: D48.5 r/o dysplastic nevus   Check Margins: No  L mid back 6.0 cm lat to spine  Epidermal / dermal shaving  Lesion diameter (cm):  0.8 Informed consent: discussed and consent obtained   Timeout: patient name, date of birth, surgical site, and procedure verified   Procedure prep:  Patient was prepped and draped in usual sterile fashion Prep type:  Isopropyl alcohol Anesthesia: the lesion was anesthetized in a standard fashion   Anesthetic:  1% lidocaine w/ epinephrine 1-100,000 buffered w/ 8.4% NaHCO3 Instrument used: flexible razor blade   Hemostasis achieved with: pressure, aluminum chloride and electrodesiccation   Outcome: patient tolerated procedure well   Post-procedure details: sterile dressing applied  and wound care instructions given   Dressing type: bandage and petrolatum    Specimen 2 - Surgical pathology Differential Diagnosis: D48.5 r/o dysplastic nevus  Check Margins: No  L mid back 7.0 cm lat to spine and inf to L mid back 6.0 cm lat to spine  Epidermal / dermal shaving  Lesion diameter (cm):  1.1 Informed consent: discussed and consent obtained   Timeout: patient name, date of birth, surgical site, and procedure  verified   Procedure prep:  Patient was prepped and draped in usual sterile fashion Prep type:  Isopropyl alcohol Anesthesia: the lesion was anesthetized in a standard fashion   Anesthetic:  1% lidocaine w/ epinephrine 1-100,000 buffered w/ 8.4% NaHCO3 Instrument used: flexible razor blade   Hemostasis achieved with: pressure, aluminum chloride and electrodesiccation   Outcome: patient tolerated procedure well   Post-procedure details: sterile dressing applied and wound care instructions given   Dressing type: bandage and petrolatum    Specimen 3 - Surgical pathology Differential Diagnosis: D48.5 r/o dysplastic nevus  Check Margins: No   Return for follow up in 4-8 weeks possible biopsies.  Luther Redo, CMA, am acting as scribe for Sarina Ser, MD . Documentation: I have reviewed the above documentation for accuracy and completeness, and I agree with the above.  Sarina Ser, MD

## 2022-01-26 ENCOUNTER — Telehealth: Payer: Self-pay

## 2022-01-26 NOTE — Telephone Encounter (Signed)
-----   Message from Ralene Bathe, MD sent at 01/25/2022  5:36 PM EST ----- Diagnosis 1. Skin , right infrascapular DYSPLASTIC NEVUS WITH MODERATE TO SEVERE ATYPIA, DEEP MARGIN INVOLVED, SEE DESCRIPTION 2. Skin , left mid back 6.0 cm lat to spine DYSPLASTIC COMPOUND NEVUS WITH MODERATE ATYPIA, PERIPHERAL MARGIN INVOLVED 3. Skin , left mid back 7.0 cm lat to spine and inf to left mid back 6.0 cm lat to spine DYSPLASTIC COMPOUND NEVUS WITH SEVERE ATYPIA, DEEP MARGIN INVOLVED, SEE DESCRIPTION  1- Moderate to severe dysplastic Schedule surgery 2- moderate dysplastic Recheck next visit 3- Severe dysplastic Schedule surgery

## 2022-01-26 NOTE — Telephone Encounter (Signed)
Error

## 2022-01-26 NOTE — Telephone Encounter (Signed)
Patient informed of pathology results and surgeries scheduled.

## 2022-01-30 ENCOUNTER — Other Ambulatory Visit: Payer: Self-pay

## 2022-01-30 ENCOUNTER — Telehealth: Payer: Self-pay

## 2022-01-30 ENCOUNTER — Ambulatory Visit (INDEPENDENT_AMBULATORY_CARE_PROVIDER_SITE_OTHER): Payer: Medicare Other | Admitting: Dermatology

## 2022-01-30 DIAGNOSIS — L988 Other specified disorders of the skin and subcutaneous tissue: Secondary | ICD-10-CM | POA: Diagnosis not present

## 2022-01-30 DIAGNOSIS — D225 Melanocytic nevi of trunk: Secondary | ICD-10-CM

## 2022-01-30 DIAGNOSIS — D239 Other benign neoplasm of skin, unspecified: Secondary | ICD-10-CM

## 2022-01-30 DIAGNOSIS — D235 Other benign neoplasm of skin of trunk: Secondary | ICD-10-CM

## 2022-01-30 DIAGNOSIS — D492 Neoplasm of unspecified behavior of bone, soft tissue, and skin: Secondary | ICD-10-CM

## 2022-01-30 MED ORDER — DOXYCYCLINE MONOHYDRATE 100 MG PO TABS: 100.0000 mg | ORAL_TABLET | Freq: Two times a day (BID) | ORAL | 0 refills | Status: AC

## 2022-01-30 NOTE — Progress Notes (Signed)
Follow-Up Visit   Subjective  Ronald Hunter is a 71 y.o. male who presents for the following: Severe dysplastic nevus bx proven (L mid back 7.0cm lat to spine and inf to L mid back 6.0cm lat to spine) and Dysplastic nevus moderate to severe atypia, bx proven (R infrascapular).  The following portions of the chart were reviewed this encounter and updated as appropriate:   Tobacco   Allergies   Meds   Problems   Med Hx   Surg Hx   Fam Hx      Review of Systems:  No other skin or systemic complaints except as noted in HPI or Assessment and Plan.  Objective  Well appearing patient in no apparent distress; mood and affect are within normal limits.  A focused examination was performed including back. Relevant physical exam findings are noted in the Assessment and Plan.  L mid back 7.0cm lat to spine and inf to L mid back 6.0cm lat to spine Pink bx site 1.3 x 0.9cm  R infrascapular Pink bx site   Assessment & Plan  Neoplasm of skin L mid back 7.0cm lat to spine and inf to L mid back 6.0cm lat to spine  Skin excision  Lesion length (cm):  1.3 Lesion width (cm):  0.9 Margin per side (cm):  0.2 Total excision diameter (cm):  1.7 Informed consent: discussed and consent obtained   Timeout: patient name, date of birth, surgical site, and procedure verified   Procedure prep:  Patient was prepped and draped in usual sterile fashion Prep type:  Isopropyl alcohol and povidone-iodine Anesthesia: the lesion was anesthetized in a standard fashion   Anesthetic:  1% lidocaine w/ epinephrine 1-100,000 buffered w/ 8.4% NaHCO3 (6cc lido w/ epi, 3cc bupivicaine, Total of 9cc) Instrument used: #15 blade   Hemostasis achieved with: pressure   Hemostasis achieved with comment:  Electrocautery Outcome: patient tolerated procedure well with no complications   Post-procedure details: sterile dressing applied and wound care instructions given   Dressing type: bandage, pressure dressing and bacitracin  (Mupirocin)    Skin repair Complexity:  Complex Final length (cm):  4 Reason for type of repair: reduce tension to allow closure, reduce the risk of dehiscence, infection, and necrosis, reduce subcutaneous dead space and avoid a hematoma, allow closure of the large defect, preserve normal anatomy, preserve normal anatomical and functional relationships and enhance both functionality and cosmetic results   Undermining: area extensively undermined   Undermining comment:  Undermining Defect Subcutaneous layers (deep stitches):  Suture size:  2-0 Suture type: Vicryl (polyglactin 910)   Subcutaneous suture technique: Inverted Dermal. Fine/surface layer approximation (top stitches):  Suture size:  2-0 Suture type: nylon   Stitches: horizontal mattress and simple running   Suture removal (days):  7 Hemostasis achieved with: pressure Outcome: patient tolerated procedure well with no complications   Post-procedure details: sterile dressing applied and wound care instructions given   Dressing type: bandage, pressure dressing and bacitracin (Mupirocin)    doxycycline (ADOXA) 100 MG tablet Take 1 tablet (100 mg total) by mouth 2 (two) times daily for 5 days. Take with food and drink  Specimen 1 - Surgical pathology Differential Diagnosis: D48.5 Bx proven severe dysplastic nevus  Check Margins: yes Pink bx site 1.3 x 0.9cm  Severe Atypia, bx proven  Excised today  Start Mupirocin oint qd to excision site Start Doxycycline 100mg  1 po bid with food and drink for 5 days  Doxycycline should be taken with food to  prevent nausea. Do not lay down for 30 minutes after taking. Be cautious with sun exposure and use good sun protection while on this medication. Pregnant women should not take this medication.     Dysplastic nevus R infrascapular Moderate to severe atypia, bx proven Pt scheduled for excison  Related Medications mupirocin ointment (BACTROBAN) 2 % Apply 1 application topically  daily. Qd to excision site  Return in about 1 week (around 02/06/2022) for suture removal.  I, [name], RMA, am acting as scribe for Sarina Ser, MD . Documentation: I have reviewed the above documentation for accuracy and completeness, and I agree with the above.  Sarina Ser, MD

## 2022-01-30 NOTE — Telephone Encounter (Signed)
Pt doing fine after today's surgery./sh 

## 2022-01-30 NOTE — Patient Instructions (Addendum)
Wound Care Instructions  Cleanse wound gently with soap and water once a day then pat dry with clean gauze. Apply a thing coat of Petrolatum (petroleum jelly, "Vaseline") over the wound (unless you have an allergy to this). We recommend that you use a new, sterile tube of Vaseline. Do not pick or remove scabs. Do not remove the yellow or white "healing tissue" from the base of the wound.  Cover the wound with fresh, clean, nonstick gauze and secure with paper tape. You may use Band-Aids in place of gauze and tape if the would is small enough, but would recommend trimming much of the tape off as there is often too much. Sometimes Band-Aids can irritate the skin.  You should call the office for your biopsy report after 1 week if you have not already been contacted.  If you experience any problems, such as abnormal amounts of bleeding, swelling, significant bruising, significant pain, or evidence of infection, please call the office immediately.  FOR ADULT SURGERY PATIENTS: If you need something for pain relief you may take 1 extra strength Tylenol (acetaminophen) AND 2 Ibuprofen (200mg  each) together every 4 hours as needed for pain. (do not take these if you are allergic to them or if you have a reason you should not take them.) Typically, you may only need pain medication for 1 to 3 days.     Doxycycline should be taken with food to prevent nausea. Do not lay down for 30 minutes after taking. Be cautious with sun exposure and use good sun protection while on this medication. Pregnant women should not take this medication.    If You Need Anything After Your Visit  If you have any questions or concerns for your doctor, please call our main line at [phone] and press option 4 to reach your doctor's medical assistant. If no one answers, please leave a voicemail as directed and we will return your call as soon as possible. Messages left after 4 pm will be answered the following business day.   You  may also send [place] a message via [place]. We typically respond to MyChart messages within 1-2 business days.  For prescription refills, please ask your pharmacy to contact our office. Our fax number is [phone].  If you have an urgent issue when the clinic is closed that cannot wait until the next business day, you can page your doctor at the number below.    Please note that while we do our best to be available for urgent issues outside of office hours, we are not available 24/7.   If you have an urgent issue and are unable to reach [place], you may choose to seek medical care at your doctor's office, retail clinic, urgent care center, or emergency room.  If you have a medical emergency, please immediately call 911 or go to the emergency department.  Pager Numbers  - Dr. Nehemiah Massed: [phone]  - Dr. Laurence Ferrari: [phone]  - Dr. Nicole Kindred: [phone]  In the event of inclement weather, please call our main line at [phone] for an update on the status of any delays or closures.  Dermatology Medication Tips: Please keep the boxes that topical medications come in in order to help keep track of the instructions about where and how to use these. Pharmacies typically print the medication instructions only on the boxes and not directly on the medication tubes.   If your medication is too expensive, please contact our office at [phone] option 4 or send [place] a  message through [place].   We are unable to tell what your co-pay for medications will be in advance as this is different depending on your insurance coverage. However, we may be able to find a substitute medication at lower cost or fill out paperwork to get insurance to cover a needed medication.   If a prior authorization is required to get your medication covered by your insurance company, please allow [place] 1-2 business days to complete this process.  Drug prices often vary depending on where the prescription is filled and some  pharmacies may offer cheaper prices.  The website www.goodrx.com contains coupons for medications through different pharmacies. The prices here do not account for what the cost may be with help from insurance (it may be cheaper with your insurance), but the website can give you the price if you did not use any insurance.  - You can print the associated coupon and take it with your prescription to the pharmacy.  - You may also stop by our office during regular business hours and pick up a GoodRx coupon card.  - If you need your prescription sent electronically to a different pharmacy, notify our office through [facility] or by phone at [phone] option 4.     Si Usted Necesita Algo Despus de Su Visita  Tambin puede enviarnos un mensaje a travs de Pharmacist, community. Por lo general respondemos a los mensajes de MyChart en el transcurso de 1 a 2 das hbiles.  Para renovar recetas, por favor pida a su farmacia que se ponga en contacto con nuestra oficina. Harland Dingwall de fax es [place] [phone].  Si tiene un asunto urgente cuando la clnica est cerrada y que no puede esperar hasta el siguiente da hbil, puede llamar/localizar a su doctor(a) al nmero que aparece a continuacin.   Por favor, tenga en cuenta que aunque hacemos todo lo posible para estar disponibles para asuntos urgentes fuera del horario de [place], no estamos disponibles las 24 horas del da, los 7 das de la [place].   Si tiene un problema urgente y no puede comunicarse con nosotros, puede optar por buscar atencin mdica  en el consultorio de su doctor(a), en una clnica privada, en un centro de atencin urgente o en una sala de emergencias.  Si tiene Engineering geologist, por favor llame inmediatamente al 911 o vaya a la sala de emergencias.  Nmeros de bper  - Dr. Nehemiah Massed: [phone]  - Dra. Moye: [phone]  - Dra. Nicole Kindred: [phone]  En caso de inclemencias del [place], por favor llame a Johnsie Kindred  principal al [phone] para una actualizacin sobre el [place] de cualquier retraso o cierre.  Consejos para la medicacin en dermatologa: Por favor, guarde las cajas en las que vienen los medicamentos de uso tpico para ayudarle a seguir las instrucciones sobre dnde y cmo usarlos. Las farmacias generalmente imprimen las instrucciones del medicamento slo en las cajas y no directamente en los tubos del [place].   Si su medicamento es muy caro, por favor, pngase en contacto con Zigmund Daniel llamando al [phone] y presione la opcin 4 o envenos un mensaje a travs de Pharmacist, community.   No podemos decirle cul ser su copago por los medicamentos por adelantado ya que esto es diferente dependiendo de la cobertura de su seguro. Sin embargo, es posible que podamos encontrar un medicamento sustituto a Electrical engineer un formulario para que el seguro cubra el medicamento que se considera necesario.   Si se requiere [employer]  autorizacin previa para que su compaa de seguros [place] su medicamento, por favor permtanos de 1 a 2 das hbiles para completar este proceso.  Los precios de los medicamentos varan con frecuencia dependiendo del Environmental consultant de dnde se surte la receta y alguna farmacias pueden ofrecer precios ms baratos.  El sitio web www.goodrx.com tiene cupones para medicamentos de Airline pilot. Los precios aqu no tienen en cuenta lo que podra costar con la ayuda del seguro (puede ser ms barato con su seguro), pero el sitio web puede darle el precio si no utiliz Research scientist (physical sciences).  - Puede imprimir el cupn correspondiente y llevarlo con su receta a la farmacia.  - Tambin puede pasar por nuestra oficina durante el horario de atencin regular y Charity fundraiser una tarjeta de cupones de GoodRx.  - Si necesita que su receta se enve electrnicamente a una farmacia diferente, informe a nuestra oficina a travs de MyChart de [place] o por telfono llamando al [phone] y presione la opcin  4.

## 2022-01-31 ENCOUNTER — Encounter: Payer: Self-pay | Admitting: Dermatology

## 2022-02-06 ENCOUNTER — Other Ambulatory Visit: Payer: Self-pay

## 2022-02-06 ENCOUNTER — Ambulatory Visit (INDEPENDENT_AMBULATORY_CARE_PROVIDER_SITE_OTHER): Payer: Medicare Other

## 2022-02-06 DIAGNOSIS — Z4802 Encounter for removal of sutures: Secondary | ICD-10-CM

## 2022-02-06 DIAGNOSIS — D239 Other benign neoplasm of skin, unspecified: Secondary | ICD-10-CM

## 2022-02-06 DIAGNOSIS — Z86018 Personal history of other benign neoplasm: Secondary | ICD-10-CM

## 2022-02-06 NOTE — Patient Instructions (Signed)

## 2022-02-06 NOTE — Progress Notes (Signed)
° °  Follow-Up Visit   Subjective  Ronald Hunter is a 71 y.o. male who presents for the following: Dysplastic nevus margins free, bx proven (left mid back 7.0cm lat to spine and inf to L mid back 6.0 cm lat to spine, pt presents for suture removal/).   The following portions of the chart were reviewed this encounter and updated as appropriate:       Review of Systems:  No other skin or systemic complaints except as noted in HPI or Assessment and Plan.  Objective  Well appearing patient in no apparent distress; mood and affect are within normal limits.  A focused examination was performed including back. Relevant physical exam findings are noted in the Assessment and Plan.  left mid back 7.0cm lat to spine and inf to L mid back 6.0 cm lat to spine Healing excision site    Assessment & Plan  Dysplastic nevus left mid back 7.0cm lat to spine and inf to L mid back 6.0 cm lat to spine  Dysplastic nevus margins free bx proven  Encounter for Removal of Sutures - Incision site at the left mid back 7.0cm lat to spine and inf to L mid back 6.0 cm lat to spine is clean, dry and intact - Wound cleansed, sutures removed, wound cleansed and steri strips applied.  - Discussed pathology results showing Dysplastic nevus margins free  - Patient advised to keep steri-strips dry until they fall off. - Scars remodel for a full year. - Once steri-strips fall off, patient can apply over-the-counter silicone scar cream each night to help with scar remodeling if desired. - Patient advised to call with any concerns or if they notice any new or changing lesions.   Related Medications mupirocin ointment (BACTROBAN) 2 % Apply 1 application topically daily. Qd to excision site   Return for as scheduled for surgery.  I, Tavish Gettis, RMA, am acting as Education administrator for [employer], CMA .

## 2022-02-28 ENCOUNTER — Ambulatory Visit: Payer: Medicare Other | Admitting: Dermatology

## 2022-03-13 ENCOUNTER — Other Ambulatory Visit: Payer: Self-pay

## 2022-03-13 ENCOUNTER — Telehealth: Payer: Self-pay

## 2022-03-13 ENCOUNTER — Ambulatory Visit (INDEPENDENT_AMBULATORY_CARE_PROVIDER_SITE_OTHER): Payer: Medicare Other | Admitting: Dermatology

## 2022-03-13 ENCOUNTER — Encounter: Payer: Self-pay | Admitting: Dermatology

## 2022-03-13 DIAGNOSIS — T8130XA Disruption of wound, unspecified, initial encounter: Secondary | ICD-10-CM | POA: Diagnosis not present

## 2022-03-13 DIAGNOSIS — D225 Melanocytic nevi of trunk: Secondary | ICD-10-CM

## 2022-03-13 DIAGNOSIS — D235 Other benign neoplasm of skin of trunk: Secondary | ICD-10-CM | POA: Diagnosis not present

## 2022-03-13 DIAGNOSIS — D239 Other benign neoplasm of skin, unspecified: Secondary | ICD-10-CM

## 2022-03-13 DIAGNOSIS — D2261 Melanocytic nevi of right upper limb, including shoulder: Secondary | ICD-10-CM | POA: Diagnosis not present

## 2022-03-13 DIAGNOSIS — D229 Melanocytic nevi, unspecified: Secondary | ICD-10-CM

## 2022-03-13 MED ORDER — DOXYCYCLINE HYCLATE 100 MG PO TABS: ORAL_TABLET | ORAL | 0 refills | Status: DC

## 2022-03-13 NOTE — Progress Notes (Signed)
? ?Follow-Up Visit ?  ?Subjective  ?Ronald Hunter is a 71 y.o. male who presents for the following: Moderate to severe dysplastic nevus bx proven (R infrascapular, pt presents for excsion). ?The patient has spots, moles and lesions to be evaluated, some may be new or changing and the patient has concerns that these could be cancer. ? ?The following portions of the chart were reviewed this encounter and updated as appropriate:  ? Tobacco  Allergies  Meds  Problems  Med Hx  Surg Hx  Fam Hx   ?  ?Review of Systems:  No other skin or systemic complaints except as noted in HPI or Assessment and Plan. ? ?Objective  ?Well appearing patient in no apparent distress; mood and affect are within normal limits. ? ?A focused examination was performed including back. Relevant physical exam findings are noted in the Assessment and Plan. ? ?R infrascapular ?Pink bx site 1.2 x 1.0cm ? ?L mid back 7.0 cm lat to spine and inf to L mid back 6.0 cm lat to spine ?Suture above skin in excision site.  ? ?back ?Slightly irregular brow macules back ? ? ?Assessment & Plan  ?Dysplastic nevus ?R infrascapular ? ?Moderate to severe, bx proven ?Excised today ?Start Mupirocin oint qd to excision site (pt has at home) ? ?Start Doxycycline '100mg'$  po BID x 7 days. Doxycycline should be taken with food to prevent nausea. Do not lay down for 30 minutes after taking. Be cautious with sun exposure and use good sun protection while on this medication. Pregnant women should not take this medication.  ? ? ?Skin excision - R infrascapular ? ?Lesion length (cm):  1.2 ?Lesion width (cm):  1 ?Margin per side (cm):  0.2 ?Total excision diameter (cm):  1.6 ?Informed consent: discussed and consent obtained   ?Timeout: patient name, date of birth, surgical site, and procedure verified   ?Procedure prep:  Patient was prepped and draped in usual sterile fashion ?Prep type:  Isopropyl alcohol and povidone-iodine ?Anesthesia: the lesion was anesthetized in a  standard fashion   ?Anesthetic:  1% lidocaine w/ epinephrine 1-100,000 buffered w/ 8.4% NaHCO3 (9cc lido w/ epi, 3cc bupivicaine, Total of 12cc) ?Instrument used: #15 blade   ?Hemostasis achieved with: pressure   ?Hemostasis achieved with comment:  Electrocautery ?Outcome: patient tolerated procedure well with no complications   ?Post-procedure details: sterile dressing applied and wound care instructions given   ?Dressing type: bandage and pressure dressing (Mupirocin)   ? ?Skin repair - R infrascapular ?Complexity:  Complex ?Final length (cm):  4.5 ?Reason for type of repair: reduce tension to allow closure, reduce the risk of dehiscence, infection, and necrosis, reduce subcutaneous dead space and avoid a hematoma, allow closure of the large defect, preserve normal anatomy, preserve normal anatomical and functional relationships and enhance both functionality and cosmetic results   ?Undermining: area extensively undermined   ?Undermining comment:  Undermining Defect 1.6cm ?Subcutaneous layers (deep stitches):  ?Suture size:  2-0 ?Suture type: Vicryl (polyglactin 910)   ?Subcutaneous suture technique: Inverted Dermal. ?Fine/surface layer approximation (top stitches):  ?Suture size:  2-0 ?Suture type: nylon   ?Stitches: simple running   ?Suture removal (days):  7 ?Hemostasis achieved with: pressure ?Outcome: patient tolerated procedure well with no complications   ?Post-procedure details: sterile dressing applied and wound care instructions given   ?Dressing type: bandage, pressure dressing and bacitracin (Mupirocin)   ? ?doxycycline (VIBRA-TABS) 100 MG tablet - R infrascapular ?Take one tab po BID with food for seven days. ? ?  Specimen 1 - Surgical pathology ?Differential Diagnosis: D48.5 Moderate to severe dysplastic nevus bx proven ? ?Check Margins: yes ?Pink bx site 1.2 x 1.0cm ?[id number] ? ?Related Medications ?mupirocin ointment (BACTROBAN) 2 % ?Apply 1 application topically daily. Qd to excision  site ? ?Spitting suture, initial encounter ?L mid back 7.0 cm lat to spine and inf to L mid back 6.0 cm lat to spine ? ?At site of severely dysplastic nevus, Excised 01/30/22 - plan spitting suture removal at post op appointment.  ? ?Nevus ?back ? ?Plan bx and shave removal at f/u appt ? ? ?Return in about 1 week (around 03/20/2022) for suture removal, bx's and spitting sutures. ? ?I, [name], RMA, am acting as scribe for Sarina Ser, MD . ?Documentation: I have reviewed the above documentation for accuracy and completeness, and I agree with the above. ? ?Sarina Ser, MD ? ?

## 2022-03-13 NOTE — Patient Instructions (Addendum)
Wound Care Instructions ? ?On the day following your surgery, you should begin doing daily dressing changes: ?Remove the old dressing and discard it. ?Cleanse the wound gently with tap water. This may be done in the shower or by placing a wet gauze pad directly on the wound and letting it soak for several minutes. ?It is important to gently remove any dried blood from the wound in order to encourage healing. This may be done by gently rolling a moistened Q-tip on the dried blood. Do not pick at the wound. ?If the wound should start to bleed, continue cleaning the wound, then place a moist gauze pad on the wound and hold pressure for a few minutes.  ?Make sure you then dry the skin surrounding the wound completely or the tape will not stick to the skin. Do not use cotton balls on the wound. ?After the wound is clean and dry, apply the ointment gently with a Q-tip. ?Cut a non-stick pad to fit the size of the wound. Lay the pad flush to the wound. If the wound is draining, you may want to reinforce it with a small amount of gauze on top of the non-stick pad for a little added compression to the area. ?Use the tape to seal the area completely. ?Select from the following with respect to your individual situation: ?If your wound has been stitched closed: continue the above steps 1-8 at least daily until your sutures are removed. ?If your wound has been left open to heal: continue steps 1-8 at least daily for the first 3-4 weeks. ?We would like for you to take a few extra precautions for at least the next week. ?Sleep with your head elevated on pillows if our wound is on your head. ?Do not bend over or lift heavy items to reduce the chance of elevated blood pressure to the wound ?Do not participate in particularly strenuous activities. ? ? ?Below is a list of dressing supplies you might need.  ?Cotton-tipped applicators - Q-tips ?Gauze pads (2x2 and/or 4x4) - All-Purpose Sponges ?Non-stick dressing material - Telfa ?Tape -  Paper or Hypafix ?New and clean tube of petroleum jelly - Vaseline  ? ? ?Comments on Post-Operative Period ?Slight swelling and redness often appear around the wound. This is normal and will disappear within several days following the surgery. ?The healing wound will drain a brownish-red-yellow discharge during healing. This is a normal phase of wound healing. As the wound begins to heal, the drainage may increase in amount. Again, this drainage is normal. ?Notify us if the drainage becomes persistently bloody, excessively swollen, or intensely painful or develops a foul odor or red streaks.  ?If you should experience mild discomfort during the healing phase, you may take an aspirin-free medication such as Tylenol (acetaminophen). Notify us if the discomfort is severe or persistent. Avoid alcoholic beverages when taking pain medicine. ? ?In Case of Wound Hemorrhage ?A wound hemorrhage is when the bandage suddenly becomes soaked with bright red blood and flows profusely. If this happens, sit down or lie down with your head elevated. If the wound has a dressing on it, do not remove the dressing. Apply pressure to the existing gauze. If the wound is not covered, use a gauze pad to apply pressure and continue applying the pressure for 20 minutes without peeking. DO NOT COVER THE WOUND WITH A LARGE TOWEL OR WASH CLOTH. Release your hand from the wound site but do not remove the dressing. If the bleeding has stopped,   gently clean around the wound. Leave the dressing in place for 24 hours if possible. This wait time allows the blood vessels to close off so that you do not spark a new round of bleeding by disrupting the newly clotted blood vessels with an immediate dressing change. If the bleeding does not subside, continue to hold pressure. If matters are out of your control, contact an After Hours clinic or go to the Emergency Room. ? ?Doxycycline should be taken with food to prevent nausea. Do not lay down for 30 minutes  after taking. Be cautious with sun exposure and use good sun protection while on this medication. Pregnant women should not take this medication.   ? ?If You Need Anything After Your Visit ? ?If you have any questions or concerns for your doctor, please call our main line at [phone] and press option 4 to reach your doctor's medical assistant. If no one answers, please leave a voicemail as directed and we will return your call as soon as possible. Messages left after 4 pm will be answered the following business day.  ? ?You may also send [place] a message via MyChart. We typically respond to MyChart messages within 1-2 business days. ? ?For prescription refills, please ask your pharmacy to contact our office. Our fax number is [phone]. ? ?If you have an urgent issue when the clinic is closed that cannot wait until the next business day, you can page your doctor at the number below.   ? ?Please note that while we do our best to be available for urgent issues outside of office hours, we are not available 24/7.  ? ?If you have an urgent issue and are unable to reach [place], you may choose to seek medical care at your doctor's office, retail clinic, urgent care center, or emergency room. ? ?If you have a medical emergency, please immediately call 911 or go to the emergency department. ? ?Pager Numbers ? ?- Dr. Nehemiah Massed: [phone] ? ?- Dr. Laurence Ferrari: [phone] ? ?- Dr. Nicole Kindred: [phone] ? ?In the event of inclement weather, please call our main line at [phone] for an update on the status of any delays or closures. ? ?Dermatology Medication Tips: ?Please keep the boxes that topical medications come in in order to help keep track of the instructions about where and how to use these. Pharmacies typically print the medication instructions only on the boxes and not directly on the medication tubes.  ? ?If your medication is too expensive, please contact our office at [phone] option 4 or send [place] a message  through [place].  ? ?We are unable to tell what your co-pay for medications will be in advance as this is different depending on your insurance coverage. However, we may be able to find a substitute medication at lower cost or fill out paperwork to get insurance to cover a needed medication.  ? ?If a prior authorization is required to get your medication covered by your insurance company, please allow [place] 1-2 business days to complete this process. ? ?Drug prices often vary depending on where the prescription is filled and some pharmacies may offer cheaper prices. ? ?The website www.goodrx.com contains coupons for medications through different pharmacies. The prices here do not account for what the cost may be with help from insurance (it may be cheaper with your insurance), but the website can give you the price if you did not use any insurance.  ?- You can print the associated coupon and take it with  your prescription to the pharmacy.  ?- You may also stop by our office during regular business hours and pick up a GoodRx coupon card.  ?- If you need your prescription sent electronically to a different pharmacy, notify our office through [facility] or by phone at [phone] option 4. ? ? ? ? ?Si Usted Necesita Algo Despu?s de Su Visita ? ?Tambi?n puede enviarnos un mensaje a trav?s de MyChart. Por lo general respondemos a los mensajes de MyChart en el transcurso de 1 a 2 d?as h?biles. ? ?Para renovar recetas, por favor pida a su farmacia que se ponga en contacto con nuestra oficina. Nuestro n?mero de fax es el [phone]. ? ?Si tiene un asunto urgente cuando la cl?nica est? cerrada y que no puede esperar hasta el siguiente d?a h?bil, puede llamar/localizar a su doctor(a) al n?mero que aparece a continuaci?n.  ? ?Por favor, tenga en cuenta que aunque hacemos todo lo posible para estar disponibles para asuntos urgentes fuera del horario de oficina, no estamos disponibles las 24 horas del d?a, los 7 d?as de  la semana.  ? ?Si tiene un problema urgente y no puede comunicarse con nosotros, puede optar por buscar atenci?n m?dica  en el consultorio de su doctor(a), en una cl?nica privada, en un centro de atenci?

## 2022-03-13 NOTE — Telephone Encounter (Signed)
Patient doing fine after surgery today./sh ?

## 2022-03-15 ENCOUNTER — Encounter: Payer: Self-pay | Admitting: Dermatology

## 2022-03-20 ENCOUNTER — Ambulatory Visit (INDEPENDENT_AMBULATORY_CARE_PROVIDER_SITE_OTHER): Payer: Medicare Other | Admitting: Dermatology

## 2022-03-20 ENCOUNTER — Encounter: Payer: Self-pay | Admitting: Dermatology

## 2022-03-20 DIAGNOSIS — D225 Melanocytic nevi of trunk: Secondary | ICD-10-CM

## 2022-03-20 DIAGNOSIS — D229 Melanocytic nevi, unspecified: Secondary | ICD-10-CM

## 2022-03-20 DIAGNOSIS — L821 Other seborrheic keratosis: Secondary | ICD-10-CM

## 2022-03-20 DIAGNOSIS — L578 Other skin changes due to chronic exposure to nonionizing radiation: Secondary | ICD-10-CM

## 2022-03-20 DIAGNOSIS — Z86018 Personal history of other benign neoplasm: Secondary | ICD-10-CM

## 2022-03-20 DIAGNOSIS — D485 Neoplasm of uncertain behavior of skin: Secondary | ICD-10-CM

## 2022-03-20 DIAGNOSIS — L820 Inflamed seborrheic keratosis: Secondary | ICD-10-CM

## 2022-03-20 NOTE — Progress Notes (Signed)
? ?Follow-Up Visit ?  ?Subjective  ?Ronald Hunter is a 71 y.o. male who presents for the following: Post op/suture removal (R infra scapular - pathology proven margins free severely dysplastic nevus, patient is here today for suture removal. ). Other irregular nevi on the back that need to be biopsied today. The patient has spots, moles and lesions to be evaluated, some may be new or changing. ? ?The following portions of the chart were reviewed this encounter and updated as appropriate:  ? Tobacco  Allergies  Meds  Problems  Med Hx  Surg Hx  Fam Hx   ?  ?Review of Systems:  No other skin or systemic complaints except as noted in HPI or Assessment and Plan. ? ?Objective  ?Well appearing patient in no apparent distress; mood and affect are within normal limits. ? ?A focused examination was performed including the trunk and extremities. Relevant physical exam findings are noted in the Assessment and Plan. ? ?Mid back spinal sup ?1.1 cm irregular brown macule ? ? ? ? ?Mid back spinal inf ?0.6 cm irregular brown macule ? ?R mid back 12.5 cm lat to spine inf ?0.6 cm irregular brown macule ? ?R mid back 12.5 cm lat to spine sup ?0.6 cm irregular brown macule ? ?Back x 5 (5) ?Erythematous stuck-on, waxy papule or plaque ? ?R infra scapular ?Healing excision site. ? ? ?Assessment & Plan  ?Neoplasm of uncertain behavior of skin (4) ?Mid back spinal sup ?Epidermal / dermal shaving ? ?Lesion diameter (cm):  1.1 ?Informed consent: discussed and consent obtained   ?Timeout: patient name, date of birth, surgical site, and procedure verified   ?Procedure prep:  Patient was prepped and draped in usual sterile fashion ?Prep type:  Isopropyl alcohol ?Anesthesia: the lesion was anesthetized in a standard fashion   ?Anesthetic:  1% lidocaine w/ epinephrine 1-100,000 buffered w/ 8.4% NaHCO3 ?Instrument used: flexible razor blade   ?Hemostasis achieved with: pressure, aluminum chloride and electrodesiccation   ?Outcome: patient  tolerated procedure well   ?Post-procedure details: sterile dressing applied and wound care instructions given   ?Dressing type: bandage and petrolatum   ? ?Specimen 1 - Surgical pathology ?Differential Diagnosis: D48.5 r/o dysplastic nevus ?Check Margins: No ? ?Mid back spinal inf ?Epidermal / dermal shaving ? ?Lesion diameter (cm):  0.6 ?Informed consent: discussed and consent obtained   ?Timeout: patient name, date of birth, surgical site, and procedure verified   ?Procedure prep:  Patient was prepped and draped in usual sterile fashion ?Prep type:  Isopropyl alcohol ?Anesthesia: the lesion was anesthetized in a standard fashion   ?Anesthetic:  1% lidocaine w/ epinephrine 1-100,000 buffered w/ 8.4% NaHCO3 ?Instrument used: flexible razor blade   ?Hemostasis achieved with: pressure, aluminum chloride and electrodesiccation   ?Outcome: patient tolerated procedure well   ?Post-procedure details: sterile dressing applied and wound care instructions given   ?Dressing type: bandage and petrolatum   ? ?Specimen 2 - Surgical pathology ?Differential Diagnosis: D48.5 r/o dysplastic nevus ?Check Margins: No ? ?R mid back 12.5 cm lat to spine inf ?Epidermal / dermal shaving ? ?Lesion diameter (cm):  0.6 ?Informed consent: discussed and consent obtained   ?Timeout: patient name, date of birth, surgical site, and procedure verified   ?Procedure prep:  Patient was prepped and draped in usual sterile fashion ?Prep type:  Isopropyl alcohol ?Anesthesia: the lesion was anesthetized in a standard fashion   ?Anesthetic:  1% lidocaine w/ epinephrine 1-100,000 buffered w/ 8.4% NaHCO3 ?Instrument used: flexible razor blade   ?  Hemostasis achieved with: pressure, aluminum chloride and electrodesiccation   ?Outcome: patient tolerated procedure well   ?Post-procedure details: sterile dressing applied and wound care instructions given   ?Dressing type: bandage and petrolatum   ? ?Specimen 3 - Surgical pathology ?Differential Diagnosis: D48.5  r/o dysplastic nevus  ?Check Margins: No ? ?R mid back 12.5 cm lat to spine sup ?Epidermal / dermal shaving ? ?Lesion diameter (cm):  0.6 ?Informed consent: discussed and consent obtained   ?Timeout: patient name, date of birth, surgical site, and procedure verified   ?Procedure prep:  Patient was prepped and draped in usual sterile fashion ?Prep type:  Isopropyl alcohol ?Anesthesia: the lesion was anesthetized in a standard fashion   ?Anesthetic:  1% lidocaine w/ epinephrine 1-100,000 buffered w/ 8.4% NaHCO3 ?Instrument used: flexible razor blade   ?Hemostasis achieved with: pressure, aluminum chloride and electrodesiccation   ?Outcome: patient tolerated procedure well   ?Post-procedure details: sterile dressing applied and wound care instructions given   ?Dressing type: bandage and petrolatum   ? ?Specimen 4 - Surgical pathology ?Differential Diagnosis: D48.5 r/o dysplastic nevus  ?Check Margins: No ? ?Inflamed seborrheic keratosis (5) ?Back x 5 ?Destruction of lesion - Back x 5 ?Complexity: simple   ?Destruction method: cryotherapy   ?Informed consent: discussed and consent obtained   ?Timeout:  patient name, date of birth, surgical site, and procedure verified ?Lesion destroyed using liquid nitrogen: Yes   ?Region frozen until ice ball extended beyond lesion: Yes   ?Outcome: patient tolerated procedure well with no complications   ?Post-procedure details: wound care instructions given   ? ?History of dysplastic nevus ?R infra scapular ?Encounter for Removal of Sutures ?- Incision site at the R infra scapular is clean, dry and intact ?- Wound cleansed, sutures removed, wound cleansed and steri strips applied.  ?- Discussed pathology results showing a margins free severely dysplastic nevus.  ?- Patient advised to keep steri-strips dry until they fall off. ?- Scars remodel for a full year. ?- Once steri-strips fall off, patient can apply over-the-counter silicone scar cream each night to help with scar remodeling  if desired. ?- Patient advised to call with any concerns or if they notice any new or changing lesions. ? ?Seborrheic Keratoses ?- Stuck-on, waxy, tan-brown papules and/or plaques  ?- Benign-appearing ?- Discussed benign etiology and prognosis. ?- Observe ?- Call for any changes ? ?Actinic Damage ?- chronic, secondary to cumulative UV radiation exposure/sun exposure over time ?- diffuse scaly erythematous macules with underlying dyspigmentation ?- Recommend daily broad spectrum sunscreen SPF 30+ to sun-exposed areas, reapply every 2 hours as needed.  ?- Recommend staying in the shade or wearing long sleeves, sun glasses (UVA+UVB protection) and wide brim hats (4-inch brim around the entire circumference of the hat). ?- Call for new or changing lesions. ? ?Melanocytic Nevi ?- Tan-brown and/or pink-flesh-colored symmetric macules and papules ?- Benign appearing on exam today ?- Observation ?- Call clinic for new or changing moles ?- Recommend daily use of broad spectrum spf 30+ sunscreen to sun-exposed areas.  ? ?Return for appointment as scheduled. ? ?I, Rudell Cobb, CMA, am acting as scribe for Sarina Ser, MD . ?Documentation: I have reviewed the above documentation for accuracy and completeness, and I agree with the above. ? ?Sarina Ser, MD ? ?

## 2022-03-20 NOTE — Patient Instructions (Signed)

## 2022-03-22 ENCOUNTER — Telehealth: Payer: Self-pay

## 2022-03-22 NOTE — Telephone Encounter (Signed)
-----   Message from Ralene Bathe, MD sent at 03/21/2022  6:20 PM EDT ----- ?Diagnosis ?1. Skin , mid back spinal sup ?DYSPLASTIC COMPOUND NEVUS WITH MODERATE ATYPIA, DEEP MARGIN INVOLVED ?2. Skin , mid back spinal inf ?DYSPLASTIC COMPOUND NEVUS WITH MODERATE ATYPIA, DEEP MARGIN INVOLVED ?3. Skin , right mid back 12.5 cm lat to spine inf ?DYSPLASTIC NEVUS WITH MODERATE TO SEVERE ATYPIA, CLOSE TO MARGIN, SEE DESCRIPTION ?4. Skin , right mid back 12.5 cm lat to spine sup ?DYSPLASTIC COMPOUND NEVUS WITH SEVERE ATYPIA, DEEP MARGIN INVOLVED, SEE DESCRIPTION ? ?1&2 - both Moderate dysplastic ?Recheck next visit ?3- moderate to severe dysplastic ?Margin clear but "close to margin" ?Recheck next visit ?May need additional procedure ?4- Severe dysplastic ?Schedule surgery ?

## 2022-03-22 NOTE — Telephone Encounter (Signed)
Patient informed of pathology results and appointment scheduled.  °

## 2022-03-27 ENCOUNTER — Other Ambulatory Visit: Payer: Self-pay | Admitting: Family Medicine

## 2022-03-27 DIAGNOSIS — I10 Essential (primary) hypertension: Secondary | ICD-10-CM

## 2022-04-10 ENCOUNTER — Ambulatory Visit (INDEPENDENT_AMBULATORY_CARE_PROVIDER_SITE_OTHER): Payer: Medicare Other | Admitting: Dermatology

## 2022-04-10 ENCOUNTER — Encounter: Payer: Self-pay | Admitting: Dermatology

## 2022-04-10 DIAGNOSIS — D235 Other benign neoplasm of skin of trunk: Secondary | ICD-10-CM

## 2022-04-10 DIAGNOSIS — D225 Melanocytic nevi of trunk: Secondary | ICD-10-CM | POA: Diagnosis not present

## 2022-04-10 DIAGNOSIS — D239 Other benign neoplasm of skin, unspecified: Secondary | ICD-10-CM

## 2022-04-10 MED ORDER — DOXYCYCLINE HYCLATE 100 MG PO TABS: ORAL_TABLET | ORAL | 0 refills | Status: DC

## 2022-04-10 NOTE — Patient Instructions (Signed)
Wound Care Instructions ? ?On the day following your surgery, you should begin doing daily dressing changes: ?Remove the old dressing and discard it. ?Cleanse the wound gently with tap water. This may be done in the shower or by placing a wet gauze pad directly on the wound and letting it soak for several minutes. ?It is important to gently remove any dried blood from the wound in order to encourage healing. This may be done by gently rolling a moistened Q-tip on the dried blood. Do not pick at the wound. ?If the wound should start to bleed, continue cleaning the wound, then place a moist gauze pad on the wound and hold pressure for a few minutes.  ?Make sure you then dry the skin surrounding the wound completely or the tape will not stick to the skin. Do not use cotton balls on the wound. ?After the wound is clean and dry, apply the ointment gently with a Q-tip. ?Cut a non-stick pad to fit the size of the wound. Lay the pad flush to the wound. If the wound is draining, you may want to reinforce it with a small amount of gauze on top of the non-stick pad for a little added compression to the area. ?Use the tape to seal the area completely. ?Select from the following with respect to your individual situation: ?If your wound has been stitched closed: continue the above steps 1-8 at least daily until your sutures are removed. ?If your wound has been left open to heal: continue steps 1-8 at least daily for the first 3-4 weeks. ?We would like for you to take a few extra precautions for at least the next week. ?Sleep with your head elevated on pillows if our wound is on your head. ?Do not bend over or lift heavy items to reduce the chance of elevated blood pressure to the wound ?Do not participate in particularly strenuous activities. ? ? ?Below is a list of dressing supplies you might need.  ?Cotton-tipped applicators - Q-tips ?Gauze pads (2x2 and/or 4x4) - All-Purpose Sponges ?Non-stick dressing material - Telfa ?Tape -  Paper or Hypafix ?New and clean tube of petroleum jelly - Vaseline  ? ? ?Comments on Post-Operative Period ?Slight swelling and redness often appear around the wound. This is normal and will disappear within several days following the surgery. ?The healing wound will drain a brownish-red-yellow discharge during healing. This is a normal phase of wound healing. As the wound begins to heal, the drainage may increase in amount. Again, this drainage is normal. ?Notify us if the drainage becomes persistently bloody, excessively swollen, or intensely painful or develops a foul odor or red streaks.  ?If you should experience mild discomfort during the healing phase, you may take an aspirin-free medication such as Tylenol (acetaminophen). Notify us if the discomfort is severe or persistent. Avoid alcoholic beverages when taking pain medicine. ? ?In Case of Wound Hemorrhage ?A wound hemorrhage is when the bandage suddenly becomes soaked with bright red blood and flows profusely. If this happens, sit down or lie down with your head elevated. If the wound has a dressing on it, do not remove the dressing. Apply pressure to the existing gauze. If the wound is not covered, use a gauze pad to apply pressure and continue applying the pressure for 20 minutes without peeking. DO NOT COVER THE WOUND WITH A LARGE TOWEL OR WASH CLOTH. Release your hand from the wound site but do not remove the dressing. If the bleeding has stopped,   gently clean around the wound. Leave the dressing in place for 24 hours if possible. This wait time allows the blood vessels to close off so that you do not spark a new round of bleeding by disrupting the newly clotted blood vessels with an immediate dressing change. If the bleeding does not subside, continue to hold pressure. If matters are out of your control, contact an After Hours clinic or go to the Emergency Room. ? ? ?If You Need Anything After Your Visit ? ?If you have any questions or concerns for  your doctor, please call our main line at [phone] and press option 4 to reach your doctor's medical assistant. If no one answers, please leave a voicemail as directed and we will return your call as soon as possible. Messages left after 4 pm will be answered the following business day.  ? ?You may also send [place] a message via MyChart. We typically respond to MyChart messages within 1-2 business days. ? ?For prescription refills, please ask your pharmacy to contact our office. Our fax number is [phone]. ? ?If you have an urgent issue when the clinic is closed that cannot wait until the next business day, you can page your doctor at the number below.   ? ?Please note that while we do our best to be available for urgent issues outside of office hours, we are not available 24/7.  ? ?If you have an urgent issue and are unable to reach [place], you may choose to seek medical care at your doctor's office, retail clinic, urgent care center, or emergency room. ? ?If you have a medical emergency, please immediately call 911 or go to the emergency department. ? ?Pager Numbers ? ?- Dr. Nehemiah Massed: [phone] ? ?- Dr. Laurence Ferrari: [phone] ? ?- Dr. Nicole Kindred: [phone] ? ?In the event of inclement weather, please call our main line at [phone] for an update on the status of any delays or closures. ? ?Dermatology Medication Tips: ?Please keep the boxes that topical medications come in in order to help keep track of the instructions about where and how to use these. Pharmacies typically print the medication instructions only on the boxes and not directly on the medication tubes.  ? ?If your medication is too expensive, please contact our office at [phone] option 4 or send [place] a message through [place].  ? ?We are unable to tell what your co-pay for medications will be in advance as this is different depending on your insurance coverage. However, we may be able to find a substitute medication at lower cost or fill out  paperwork to get insurance to cover a needed medication.  ? ?If a prior authorization is required to get your medication covered by your insurance company, please allow [place] 1-2 business days to complete this process. ? ?Drug prices often vary depending on where the prescription is filled and some pharmacies may offer cheaper prices. ? ?The website www.goodrx.com contains coupons for medications through different pharmacies. The prices here do not account for what the cost may be with help from insurance (it may be cheaper with your insurance), but the website can give you the price if you did not use any insurance.  ?- You can print the associated coupon and take it with your prescription to the pharmacy.  ?- You may also stop by our office during regular business hours and pick up a GoodRx coupon card.  ?- If you need your prescription sent electronically to a different pharmacy, notify our office  through [facility] or by phone at [phone] option 4. ? ? ? ? ?Si Usted Necesita Algo Despu?s de Su Visita ? ?Tambi?n puede enviarnos un mensaje a trav?s de MyChart. Por lo general respondemos a los mensajes de MyChart en el transcurso de 1 a 2 d?as h?biles. ? ?Para renovar recetas, por favor pida a su farmacia que se ponga en contacto con nuestra oficina. Nuestro n?mero de fax es el [phone]. ? ?Si tiene un asunto urgente cuando la cl?nica est? cerrada y que no puede esperar hasta el siguiente d?a h?bil, puede llamar/localizar a su doctor(a) al n?mero que aparece a continuaci?n.  ? ?Por favor, tenga en cuenta que aunque hacemos todo lo posible para estar disponibles para asuntos urgentes fuera del horario de oficina, no estamos disponibles las 24 horas del d?a, los 7 d?as de la semana.  ? ?Si tiene un problema urgente y no puede comunicarse con nosotros, puede optar por buscar atenci?n m?dica  en el consultorio de su doctor(a), en una cl?nica privada, en un centro de atenci?n urgente o en una sala de  emergencias. ? ?Si tiene Engineer, maintenance (IT) m?dica, por favor llame inmediatamente al 911 o vaya a la sala de emergencias. ? ?N?meros de b?per ? ?- Dr. Nehemiah Massed: [phone] ? ?- Dra. Moye: [phone] ? ?- Dra.

## 2022-04-10 NOTE — Progress Notes (Signed)
? ?Follow-Up Visit ?  ?Subjective  ?Ronald Hunter is a 71 y.o. male who presents for the following: Procedure (Biopsy proven severe dysplastic nevus of right mid back 12.5 cm lat to spine sup - [facility] today). ? ?The following portions of the chart were reviewed this encounter and updated as appropriate:  ? Tobacco  Allergies  Meds  Problems  Med Hx  Surg Hx  Fam Hx   ?  ?Review of Systems:  No other skin or systemic complaints except as noted in HPI or Assessment and Plan. ? ?Objective  ?Well appearing patient in no apparent distress; mood and affect are within normal limits. ? ?A focused examination was performed including back. Relevant physical exam findings are noted in the Assessment and Plan. ? ?Right mid back 12.5 cm lat to spine inf, Right mid back 12.5 cm lat to spine sup ?Healing biopsy sites ? ? ?Assessment & Plan  ?Dysplastic nevus (2) ?Right mid back 12.5 cm lat to spine inf; Right mid back 12.5 cm lat to spine sup ? ?Start Doxycycline 100 mg 1 po q with food and plenty of fluid x 7 days ? ?Mupirocin ointment qd with dressing changes ? ?Will plan shave removal to right mid back 12.5 cm lat to spine inf at post op appointment ? ?Skin excision - Right mid back 12.5 cm lat to spine sup ? ?Lesion length (cm):  1.2 ?Lesion width (cm):  0.7 ?Margin per side (cm):  0.2 ?Total excision diameter (cm):  1.6 ?Informed consent: discussed and consent obtained   ?Timeout: patient name, date of birth, surgical site, and procedure verified   ?Procedure prep:  Patient was prepped and draped in usual sterile fashion ?Prep type:  Isopropyl alcohol and povidone-iodine ?Anesthesia: the lesion was anesthetized in a standard fashion   ?Anesthetic:  1% lidocaine w/ epinephrine 1-100,000 buffered w/ 8.4% NaHCO3 ?Instrument used: #15 blade   ?Hemostasis achieved with: pressure   ?Hemostasis achieved with comment:  Electrocautery ?Outcome: patient tolerated procedure well with no complications   ?Post-procedure details:  sterile dressing applied and wound care instructions given   ?Dressing type: bandage and pressure dressing (mupirocin)   ? ?Skin repair - Right mid back 12.5 cm lat to spine sup ?Complexity:  Complex ?Final length (cm):  3.5 ?Reason for type of repair: reduce tension to allow closure, reduce the risk of dehiscence, infection, and necrosis, reduce subcutaneous dead space and avoid a hematoma, allow closure of the large defect, preserve normal anatomy, preserve normal anatomical and functional relationships and enhance both functionality and cosmetic results   ?Undermining: area extensively undermined   ?Undermining comment:  Undermining defect 1.2 cm ?Subcutaneous layers (deep stitches):  ?Suture size:  2-0 ?Suture type: Vicryl (polyglactin 910)   ?Subcutaneous suture technique: inverted dermal. ?Fine/surface layer approximation (top stitches):  ?Suture size:  3-0 ?Suture type: nylon   ?Stitches: simple running   ?Suture removal (days):  7 ?Hemostasis achieved with: suture and pressure ?Outcome: patient tolerated procedure well with no complications   ?Post-procedure details: sterile dressing applied and wound care instructions given   ?Dressing type: bandage and pressure dressing (mupirocin)   ? ?Specimen 1 - Surgical pathology ?Differential Diagnosis: Biopsy proven severe dysplastic nevus ?Check Margins: Yes ?[id number] ? ?Related Medications ?mupirocin ointment (BACTROBAN) 2 % ?Apply 1 application topically daily. Qd to excision site ? ?doxycycline (VIBRA-TABS) 100 MG tablet ?Take one tab po BID with food for seven days. ? ?Return in about 1 week (around 04/17/2022) for suture removal. ? ?  I, Ashok Cordia, CMA, am acting as scribe for Sarina Ser, MD . ?Documentation: I have reviewed the above documentation for accuracy and completeness, and I agree with the above. ? ?Sarina Ser, MD ? ?

## 2022-04-16 ENCOUNTER — Other Ambulatory Visit: Payer: Self-pay | Admitting: Family Medicine

## 2022-04-16 DIAGNOSIS — I10 Essential (primary) hypertension: Secondary | ICD-10-CM

## 2022-04-17 ENCOUNTER — Encounter: Payer: Self-pay | Admitting: Dermatology

## 2022-04-17 ENCOUNTER — Ambulatory Visit (INDEPENDENT_AMBULATORY_CARE_PROVIDER_SITE_OTHER): Payer: Medicare Other | Admitting: Dermatology

## 2022-04-17 DIAGNOSIS — Z4802 Encounter for removal of sutures: Secondary | ICD-10-CM

## 2022-04-17 DIAGNOSIS — L988 Other specified disorders of the skin and subcutaneous tissue: Secondary | ICD-10-CM | POA: Diagnosis not present

## 2022-04-17 DIAGNOSIS — Z86018 Personal history of other benign neoplasm: Secondary | ICD-10-CM

## 2022-04-17 DIAGNOSIS — D235 Other benign neoplasm of skin of trunk: Secondary | ICD-10-CM

## 2022-04-17 DIAGNOSIS — D239 Other benign neoplasm of skin, unspecified: Secondary | ICD-10-CM

## 2022-04-17 NOTE — Patient Instructions (Signed)
Electrodesiccation and Curettage (?Scrape and Burn?) Wound Care Instructions ? ?Leave the original bandage on for 24 hours if possible.  If the bandage becomes soaked or soiled before that time, it is OK to remove it and examine the wound.  A small amount of post-operative bleeding is normal.  If excessive bleeding occurs, remove the bandage, place gauze over the site and apply continuous pressure (no peeking) over the area for 30 minutes. If this does not work, please call our clinic as soon as possible or page your doctor if it is after hours.  ? ?Once a day, cleanse the wound with soap and water. It is fine to shower. If a thick crust develops you may use a Q-tip dipped into dilute hydrogen peroxide (mix 1:1 with water) to dissolve it.  Hydrogen peroxide can slow the healing process, so use it only as needed.   ? ?After washing, apply petroleum jelly (Vaseline) or an antibiotic ointment if your doctor prescribed one for you, followed by a bandage.   ? ?For best healing, the wound should be covered with a layer of ointment at all times. If you are not able to keep the area covered with a bandage to hold the ointment in place, this may mean re-applying the ointment several times a day.  Continue this wound care until the wound has healed and is no longer open. It may take several weeks for the wound to heal and close. ? ?Itching and mild discomfort is normal during the healing process. ? ?If you have any discomfort, you can take Tylenol (acetaminophen) or ibuprofen as directed on the bottle. (Please do not take these if you have an allergy to them or cannot take them for another reason). ? ?Some redness, tenderness and white or yellow material in the wound is normal healing.  If the area becomes very sore and red, or develops a thick yellow-green material (pus), it may be infected; please notify us.   ? ?Wound healing continues for up to one year following surgery. It is not unusual to experience pain in the scar  from time to time during the interval.  If the pain becomes severe or the scar thickens, you should notify the office.   ? ?A slight amount of redness in a scar is expected for the first six months.  After six months, the redness will fade and the scar will soften and fade.  The color difference becomes less noticeable with time.  If there are any problems, return for a post-op surgery check at your earliest convenience. ? ?To improve the appearance of the scar, you can use silicone scar gel, cream, or sheets (such as Mederma or Serica) every night for up to one year. These are available over the counter (without a prescription). ? ?Please call our office at [phone] for any questions or concerns. ? ?If You Need Anything After Your Visit ? ?If you have any questions or concerns for your doctor, please call our main line at [phone] and press option 4 to reach your doctor's medical assistant. If no one answers, please leave a voicemail as directed and we will return your call as soon as possible. Messages left after 4 pm will be answered the following business day.  ? ?You may also send [place] a message via MyChart. We typically respond to MyChart messages within 1-2 business days. ? ?For prescription refills, please ask your pharmacy to contact our office. Our fax number is [phone]. ? ?If you have an  urgent issue when the clinic is closed that cannot wait until the next business day, you can page your doctor at the number below.   ? ?Please note that while we do our best to be available for urgent issues outside of office hours, we are not available 24/7.  ? ?If you have an urgent issue and are unable to reach [place], you may choose to seek medical care at your doctor's office, retail clinic, urgent care center, or emergency room. ? ?If you have a medical emergency, please immediately call 911 or go to the emergency department. ? ?Pager Numbers ? ?- Dr. Nehemiah Massed: [phone] ? ?- Dr. Laurence Ferrari: [phone] ? ?-  Dr. Nicole Kindred: [phone] ? ?In the event of inclement weather, please call our main line at [phone] for an update on the status of any delays or closures. ? ?Dermatology Medication Tips: ?Please keep the boxes that topical medications come in in order to help keep track of the instructions about where and how to use these. Pharmacies typically print the medication instructions only on the boxes and not directly on the medication tubes.  ? ?If your medication is too expensive, please contact our office at [phone] option 4 or send [place] a message through [place].  ? ?We are unable to tell what your co-pay for medications will be in advance as this is different depending on your insurance coverage. However, we may be able to find a substitute medication at lower cost or fill out paperwork to get insurance to cover a needed medication.  ? ?If a prior authorization is required to get your medication covered by your insurance company, please allow [place] 1-2 business days to complete this process. ? ?Drug prices often vary depending on where the prescription is filled and some pharmacies may offer cheaper prices. ? ?The website www.goodrx.com contains coupons for medications through different pharmacies. The prices here do not account for what the cost may be with help from insurance (it may be cheaper with your insurance), but the website can give you the price if you did not use any insurance.  ?- You can print the associated coupon and take it with your prescription to the pharmacy.  ?- You may also stop by our office during regular business hours and pick up a GoodRx coupon card.  ?- If you need your prescription sent electronically to a different pharmacy, notify our office through [facility] or by phone at [phone] option 4. ? ? ? ? ?Si Usted Necesita Algo Despu?s de Su Visita ? ?Tambi?n puede enviarnos un mensaje a trav?s de MyChart. Por lo general respondemos a los mensajes de MyChart en el  transcurso de 1 a 2 d?as h?biles. ? ?Para renovar recetas, por favor pida a su farmacia que se ponga en contacto con nuestra oficina. Nuestro n?mero de fax es el [phone]. ? ?Si tiene un asunto urgente cuando la cl?nica est? cerrada y que no puede esperar hasta el siguiente d?a h?bil, puede llamar/localizar a su doctor(a) al n?mero que aparece a continuaci?n.  ? ?Por favor, tenga en cuenta que aunque hacemos todo lo posible para estar disponibles para asuntos urgentes fuera del horario de oficina, no estamos disponibles las 24 horas del d?a, los 7 d?as de la semana.  ? ?Si tiene un problema urgente y no puede comunicarse con nosotros, puede optar por buscar atenci?n m?dica  en el consultorio de su doctor(a), en una cl?nica privada, en un centro de atenci?n urgente o en una sala de emergencias. ? ?  Si tiene una emergencia m?dica, por favor llame inmediatamente al 911 o vaya a la sala de emergencias. ? ?N?meros de b?per ? ?- Dr. Nehemiah Massed: [phone] ? ?- Dra. Moye: [phone] ? ?- Dra. Nicole Kindred: [phone] ? ?En caso de inclemencias del tiempo, por favor llame a nuestra l?nea principal al [phone] para una actualizaci?n sobre el estado de cualquier retraso o cierre. ? ?Consejos para la medicaci?n en dermatolog?a: ?Por favor, guarde las cajas en las que vienen los medicamentos de uso t?pico para ayudarle a seguir las instrucciones sobre d?nde y c?mo usarlos. Las farmacias generalmente imprimen las instrucciones del medicamento s?lo en las cajas y no directamente en los tubos del [place].  ? ?Si su medicamento es muy caro, por favor, p?ngase en contacto con Zigmund Daniel llamando al [phone] y presione la opci?n 4 o env?enos un mensaje a trav?s de MyChart.  ? ?No podemos decirle cu?l ser? su copago por los medicamentos por adelantado ya que esto es diferente dependiendo de la cobertura de su seguro. Sin embargo, es posible que podamos encontrar un medicamento sustituto a Electrical engineer un  formulario para que el seguro cubra el medicamento que se considera necesario.  ? ?Si se requiere Ardelia Mems autorizaci?n previa para que su compa??a de seguros [place] su medicamento, por favor perm?tanos de 1 a 2 d?as

## 2022-04-17 NOTE — Progress Notes (Signed)
? ?  Follow-Up Visit ?  ?Subjective  ?Ronald Hunter is a 71 y.o. male who presents for the following: Post op./suture removal (For margins free severely dysplastic nevus of the right mid back 12.5 cm lat to spine sup) and Bx proven moderate to severely dysplastic nevus (Of the right mid back 12.5 cm lat to spine inf - patient is here today for shave removal). ? ?The following portions of the chart were reviewed this encounter and updated as appropriate:  ? Tobacco  Allergies  Meds  Problems  Med Hx  Surg Hx  Fam Hx   ?  ?Review of Systems:  No other skin or systemic complaints except as noted in HPI or Assessment and Plan. ? ?Objective  ?Well appearing patient in no apparent distress; mood and affect are within normal limits. ? ?A focused examination was performed including the trunk. Relevant physical exam findings are noted in the Assessment and Plan. ? ?right mid back 12.5 cm lat to spine inf ?Pink biopsy site.  ? ?right mid back 12.5 cm lat to spine sup ?Healing excision site. ? ? ?Assessment & Plan  ?Dysplastic nevus ?right mid back 12.5 cm lat to spine inf ? ?Epidermal / dermal shaving - right mid back 12.5 cm lat to spine inf ? ?Lesion diameter (cm):  1.3 ?Informed consent: discussed and consent obtained   ?Timeout: patient name, date of birth, surgical site, and procedure verified   ?Procedure prep:  Patient was prepped and draped in usual sterile fashion ?Prep type:  Isopropyl alcohol ?Anesthesia: the lesion was anesthetized in a standard fashion   ?Anesthetic:  1% lidocaine w/ epinephrine 1-100,000 buffered w/ 8.4% NaHCO3 ?Instrument used: flexible razor blade   ?Hemostasis achieved with: pressure, aluminum chloride and electrodesiccation   ?Outcome: patient tolerated procedure well   ?Post-procedure details: sterile dressing applied and wound care instructions given   ?Dressing type: bandage and petrolatum   ? ?Specimen 1 - Surgical pathology ?Differential Diagnosis: D48.5 bx proven moderate to  severely dysplastic nevus  ?[id number] ?Check Margins: YES  ? ?Related Medications ?mupirocin ointment (BACTROBAN) 2 % ?Apply 1 application topically daily. Qd to excision site ? ?doxycycline (VIBRA-TABS) 100 MG tablet ?Take one tab po BID with food for seven days. ? ?History of dysplastic nevus ?right mid back 12.5 cm lat to spine sup ? ?Encounter for Removal of Sutures ?- Incision site at the right mid back 12.5 cm lat to spine sup is clean, dry and intact ?- Wound cleansed, sutures removed, wound cleansed and steri strips applied.  ?- Discussed pathology results showing margins free severely dysplastic nevus.  ?- Patient advised to keep steri-strips dry until they fall off. ?- Scars remodel for a full year. ?- Once steri-strips fall off, patient can apply over-the-counter silicone scar cream each night to help with scar remodeling if desired. ?- Patient advised to call with any concerns or if they notice any new or changing lesions. ? ?Return in about 6 months (around 10/18/2022) for TBSE. ? ?I, Rudell Cobb, CMA, am acting as scribe for Sarina Ser, MD . ?Documentation: I have reviewed the above documentation for accuracy and completeness, and I agree with the above. ? ?Sarina Ser, MD ? ?

## 2022-04-19 ENCOUNTER — Telehealth: Payer: Self-pay

## 2022-04-19 NOTE — Telephone Encounter (Signed)
-----   Message from Ralene Bathe, MD sent at 04/19/2022  9:15 AM EDT ----- ?Diagnosis ?Skin , right mid back 12.5 cm lat to spine inf ?NO RESIDUAL DYSPLASTIC NEVUS, MARGINS FREE ? ?Site of moderate to severe dysplastic ?Margins clear ?Recheck on fu ?

## 2022-04-19 NOTE — Telephone Encounter (Signed)
LM on VM please return my call  

## 2022-04-19 NOTE — Telephone Encounter (Signed)
Advised pt of bx result/sh ?

## 2022-04-25 ENCOUNTER — Encounter: Payer: Self-pay | Admitting: Dermatology

## 2022-05-07 ENCOUNTER — Other Ambulatory Visit (INDEPENDENT_AMBULATORY_CARE_PROVIDER_SITE_OTHER): Payer: Medicare Other

## 2022-05-07 DIAGNOSIS — E785 Hyperlipidemia, unspecified: Secondary | ICD-10-CM | POA: Diagnosis not present

## 2022-05-07 DIAGNOSIS — I10 Essential (primary) hypertension: Secondary | ICD-10-CM | POA: Diagnosis not present

## 2022-05-08 LAB — COMPREHENSIVE METABOLIC PANEL
AG Ratio: 1.9 (calc) (ref 1.0–2.5)
ALT: 30 U/L (ref 9–46)
AST: 24 U/L (ref 10–35)
Albumin: 4.4 g/dL (ref 3.6–5.1)
Alkaline phosphatase (APISO): 70 U/L (ref 35–144)
BUN/Creatinine Ratio: 24 (calc) — ABNORMAL HIGH (ref 6–22)
BUN: 16 mg/dL (ref 7–25)
CO2: 26 mmol/L (ref 20–32)
Calcium: 9.2 mg/dL (ref 8.6–10.3)
Chloride: 107 mmol/L (ref 98–110)
Creat: 0.68 mg/dL — ABNORMAL LOW (ref 0.70–1.35)
Globulin: 2.3 g/dL (calc) (ref 1.9–3.7)
Glucose, Bld: 103 mg/dL — ABNORMAL HIGH (ref 65–99)
Potassium: 4.3 mmol/L (ref 3.5–5.3)
Sodium: 141 mmol/L (ref 135–146)
Total Bilirubin: 0.3 mg/dL (ref 0.2–1.2)
Total Protein: 6.7 g/dL (ref 6.1–8.1)

## 2022-05-08 LAB — LIPID PANEL
Cholesterol: 172 mg/dL (ref <200)
HDL: 47 mg/dL (ref >=40)
LDL Cholesterol (Calc): 106 mg/dL (calc) — ABNORMAL HIGH
Non-HDL Cholesterol (Calc): 125 mg/dL (calc) (ref <130)
Total CHOL/HDL Ratio: 3.7 (calc) (ref <5.0)
Triglycerides: 97 mg/dL (ref <150)

## 2022-05-09 ENCOUNTER — Ambulatory Visit: Payer: Medicare Other | Admitting: Family Medicine

## 2022-05-23 ENCOUNTER — Ambulatory Visit (INDEPENDENT_AMBULATORY_CARE_PROVIDER_SITE_OTHER): Payer: Medicare Other | Admitting: Family Medicine

## 2022-05-23 ENCOUNTER — Encounter: Payer: Self-pay | Admitting: Family Medicine

## 2022-05-23 DIAGNOSIS — E785 Hyperlipidemia, unspecified: Secondary | ICD-10-CM

## 2022-05-23 DIAGNOSIS — E669 Obesity, unspecified: Secondary | ICD-10-CM | POA: Diagnosis not present

## 2022-05-23 DIAGNOSIS — I10 Essential (primary) hypertension: Secondary | ICD-10-CM

## 2022-05-23 DIAGNOSIS — G8929 Other chronic pain: Secondary | ICD-10-CM | POA: Diagnosis not present

## 2022-05-23 DIAGNOSIS — J069 Acute upper respiratory infection, unspecified: Secondary | ICD-10-CM | POA: Diagnosis not present

## 2022-05-23 DIAGNOSIS — M545 Low back pain, unspecified: Secondary | ICD-10-CM | POA: Diagnosis not present

## 2022-05-23 DIAGNOSIS — M79644 Pain in right finger(s): Secondary | ICD-10-CM | POA: Diagnosis not present

## 2022-05-23 NOTE — Assessment & Plan Note (Signed)
LDL is slightly above goal.  Discussed the option of continuing with Crestor 40 mg on its own or adding Zetia.  Patient prefers to stay on the same dosing he is currently on.  We will continue to monitor his LDL.

## 2022-05-23 NOTE — Patient Instructions (Signed)
Nice to see you. Please continue work on healthy diet and exercise. Please contact your orthopedic physician for your finger.

## 2022-05-23 NOTE — Assessment & Plan Note (Signed)
Improving.  Discussed that the patient would likely continue to improve over the next several weeks.  If he does not he will let us know.

## 2022-05-23 NOTE — Assessment & Plan Note (Signed)
Related to a distant injury.  Discussed the option of getting an x-ray today versus seeing his orthopedist.  He prefers to see his orthopedist.  He will contact them for an appointment.

## 2022-05-23 NOTE — Assessment & Plan Note (Signed)
Chronic nerve pain in his right foot.  Discussed he could continue the brace and can continue gabapentin as needed for this.  He will let me know when he needs a refill.

## 2022-05-23 NOTE — Assessment & Plan Note (Signed)
Encouraged continued healthy diet and exercise.

## 2022-05-23 NOTE — Assessment & Plan Note (Signed)
Well-controlled.  Continue lisinopril 40 mg once daily.  Labs reviewed.

## 2022-05-23 NOTE — Progress Notes (Signed)
[name], MD Phone: [phone]  Ronald Hunter is a 71 y.o. male who presents today for f/u.  HYPERTENSION Disease Monitoring Home BP Monitoring 120-130/70-80 Chest pain- no    Dyspnea- no Medications Compliance-  taking lisinopril.  Edema- no BMET    Component Value Date/Time   NA 141 05/07/2022 0745   NA 140 06/16/2012 1449   K 4.3 05/07/2022 0745   K 3.8 06/16/2012 1449   CL 107 05/07/2022 0745   CL 106 06/16/2012 1449   CO2 26 05/07/2022 0745   CO2 25 06/16/2012 1449   GLUCOSE 103 (H) 05/07/2022 0745   GLUCOSE 103 (H) 06/16/2012 1449   BUN 16 05/07/2022 0745   BUN 15 06/16/2012 1449   CREATININE 0.68 (L) 05/07/2022 0745   CALCIUM 9.2 05/07/2022 0745   CALCIUM 8.7 06/16/2012 1449   GFRNONAA >60 04/15/2021 0454   GFRNONAA >60 06/16/2012 1449   GFRAA >90 01/28/2015 1150   GFRAA >60 06/16/2012 1449   HYPERLIPIDEMIA Symptoms Chest pain on exertion:  no   Medications: Compliance- taking crestor Right upper quadrant pain- no  Muscle aches- no Lipid Panel     Component Value Date/Time   CHOL 172 05/07/2022 0745   TRIG 97 05/07/2022 0745   HDL 47 05/07/2022 0745   CHOLHDL 3.7 05/07/2022 0745   VLDL 9 04/13/2021 0347   LDLCALC 106 (H) 05/07/2022 0745   LDLDIRECT 116 (H) 04/18/2020 0831   Chronic back pain/nerve pain: Patient notes he continues to have some nerve pain in his right foot.  He was taking gabapentin 100 mg as needed at night typically 3 times a week.  He started wearing a brace on his foot and notes that helps quite a bit though his heel will end up feeling like it is on fire in the middle the night and he will take it off and subsequently he will be fine.  Right index finger pain: Patient reports he got this caught his dog kennel quite sometime ago.  He felt as though he broke it though notes he did not get it evaluated.  He has had intermittent discomfort since then.  He wonders if he should see his orthopedist.  Upper respiratory infection:  Patient reports he was sick starting a couple of weeks ago.  He is progressively improving though does continue to have some cough and blowing mucus out of his nose in the morning.  Skin lesions: He saw dermatology and had several skin lesions removed.  He had a moderate to severe dysplastic nevus though he had follow-up treatment for this with negative margins.  Social History   Tobacco Use  Smoking Status Never  Smokeless Tobacco Never    Current Outpatient Medications on File Prior to Visit  Medication Sig Dispense Refill   aspirin 81 MG tablet Take 81 mg by mouth daily.     gabapentin (NEURONTIN) 100 MG capsule Take 1 capsule (100 mg total) by mouth at bedtime. 90 capsule 0   hydrocortisone 2.5 % cream Apply topically 3 (three) times a week. Apply to scaly areas on face 3 nights weekly, Tuesday, Thursday and Saturdays 30 g 6   lisinopril (ZESTRIL) 40 MG tablet Take 1 tablet by mouth once daily 90 tablet 0   Multiple Vitamin (MULTIVITAMIN WITH MINERALS) TABS Take 1 tablet by mouth daily.     rosuvastatin (CRESTOR) 40 MG tablet Take 1 tablet by mouth once daily 90 tablet 3   No current facility-administered medications on file prior to visit.  ROS see history of present illness  Objective  Physical Exam Vitals:   05/23/22 0926 05/23/22 0950  BP: (!) 160/80 130/80  Pulse: 75   Temp: 98.2 F (36.8 C)   SpO2: 99%     BP Readings from Last 3 Encounters:  05/23/22 130/80  11/06/21 130/80  07/04/21 130/80   Wt Readings from Last 3 Encounters:  05/23/22 235 lb 9.6 oz (106.9 kg)  11/06/21 241 lb 6.4 oz (109.5 kg)  07/11/21 240 lb (108.9 kg)    Physical Exam Constitutional:      General: He is not in acute distress.    Appearance: He is not diaphoretic.  Cardiovascular:     Rate and Rhythm: Normal rate and regular rhythm.     Heart sounds: Normal heart sounds.  Pulmonary:     Effort: Pulmonary effort is normal.     Breath sounds: Normal breath sounds.  Skin:     General: Skin is warm and dry.  Neurological:     Mental Status: He is alert.     Assessment/Plan: Please see individual problem list.  Problem List Items Addressed This Visit     Chronic low back pain (Chronic)    Chronic nerve pain in his right foot.  Discussed he could continue the brace and can continue gabapentin as needed for this.  He will let me know when he needs a refill.       Essential hypertension (Chronic)    Well-controlled.  Continue lisinopril 40 mg once daily.  Labs reviewed.       Relevant Orders   Basic Metabolic Panel (BMET)   Hyperlipidemia (Chronic)    LDL is slightly above goal.  Discussed the option of continuing with Crestor 40 mg on its own or adding Zetia.  Patient prefers to stay on the same dosing he is currently on.  We will continue to monitor his LDL.       Obesity (BMI 30.0-34.9) (Chronic)    Encouraged continued healthy diet and exercise.       Finger pain, right    Related to a distant injury.  Discussed the option of getting an x-ray today versus seeing his orthopedist.  He prefers to see his orthopedist.  He will contact them for an appointment.       URI (upper respiratory infection)    Improving.  Discussed that the patient would likely continue to improve over the next several weeks.  If he does not he will let us know.         Return in about 6 months (around 11/22/2022) for Hypertension, labs 2 days prior.   [name], MD [place]

## 2022-07-02 ENCOUNTER — Encounter: Payer: Self-pay | Admitting: Family Medicine

## 2022-07-03 ENCOUNTER — Other Ambulatory Visit: Payer: Self-pay

## 2022-07-03 MED ORDER — GABAPENTIN 100 MG PO CAPS
100.0000 mg | ORAL_CAPSULE | Freq: Every day | ORAL | 0 refills | Status: DC
Start: 2022-07-03 — End: 2022-11-23

## 2022-07-12 ENCOUNTER — Ambulatory Visit (INDEPENDENT_AMBULATORY_CARE_PROVIDER_SITE_OTHER): Payer: Medicare Other

## 2022-07-12 VITALS — Ht 72.0 in | Wt 235.0 lb

## 2022-07-12 DIAGNOSIS — Z0000 Encounter for general adult medical examination without abnormal findings: Secondary | ICD-10-CM | POA: Diagnosis not present

## 2022-07-12 NOTE — Patient Instructions (Addendum)
  [name] , Thank you for taking time to come for your Medicare Wellness Visit. I appreciate your ongoing commitment to your health goals. Please review the following plan we discussed and let me know if I can assist you in the future.   These are the goals we discussed:  Goals       Patient Stated     Follow up with Primary Care Provider (pt-stated)      Maintain Healthy Lifestyle Stay active         This is a list of the screening recommended for you and due dates:  Health Maintenance  Topic Date Due   Zoster (Shingles) Vaccine (1 of 2) 10/12/2022*   Flu Shot  07/17/2022   Colon Cancer Screening  08/27/2023   Tetanus Vaccine  04/24/2028   Pneumonia Vaccine  Completed   COVID-19 Vaccine  Completed   Hepatitis C Screening: USPSTF Recommendation to screen - Ages 18-79 yo.  Completed   HPV Vaccine  Aged Out  *Topic was postponed. The date shown is not the original due date.

## 2022-07-12 NOTE — Progress Notes (Signed)
Subjective:   Ronald Hunter is a 71 y.o. male who presents for Medicare Annual/Subsequent preventive examination.  Review of Systems    No ROS.  Medicare Wellness Virtual Visit.  Visual/audio telehealth visit, UTA vital signs.   See social history for additional risk factors.   Cardiac Risk Factors include: advanced age (>48mn, >>80women);male gender;hypertension     Objective:    Today's Vitals   07/12/22 0906  Weight: 235 lb (106.6 kg)  Height: 6' (1.829 m)   Body mass index is 31.87 kg/m.     07/12/2022    9:17 AM 07/11/2021    8:50 AM 04/13/2021    3:04 AM 04/12/2021    6:06 PM 07/08/2020    8:46 AM 07/08/2019    8:31 AM 08/26/2018    7:09 AM  Advanced Directives  Does Patient Have a Medical Advance Directive? Yes Yes No No No No No  Type of [place] will       Does patient want to make changes to medical advance directive? No - Patient declined No - Patient declined       Copy of [place] Chart? No - copy requested No - copy requested       Would patient like information on creating a medical advance directive?   No - Patient declined  No - Patient declined No - Patient declined     Current Medications (verified) Outpatient Encounter Medications as of 07/12/2022  Medication Sig   aspirin 81 MG tablet Take 81 mg by mouth daily.   gabapentin (NEURONTIN) 100 MG capsule Take 1 capsule (100 mg total) by mouth at bedtime.   hydrocortisone 2.5 % cream Apply topically 3 (three) times a week. Apply to scaly areas on face 3 nights weekly, Tuesday, Thursday and Saturdays   lisinopril (ZESTRIL) 40 MG tablet Take 1 tablet by mouth once daily   Multiple Vitamin (MULTIVITAMIN WITH MINERALS) TABS Take 1 tablet by mouth daily.   rosuvastatin (CRESTOR) 40 MG tablet Take 1 tablet by mouth once daily   No facility-administered encounter medications on file as of 07/12/2022.     Allergies (verified) Patient has no known allergies.   History: Past Medical History:  Diagnosis Date   Anxiety    Arthritis    Chronic back pain    stenosis/spondylosis   Dysplastic nevus 11/15/2021   L mid to low back paraspinal, 2.0 cm lat to spine, Severe, Excised 01/16/22   Dysplastic nevus 11/15/2021   L mid to low back lateral, 11.0cm lat to spinal, moderate atypia   Dysplastic nevus 01/23/2022   R infrascapular - mod to severe, Excised 03/13/22   Dysplastic nevus 01/23/2022   L mid back 7.0 cm lat to spine and inf to L mid back 6.0 cm lat to spine - severe, Excised 01/30/22   Dysplastic nevus 01/23/2022   L mid back 6.0 cm lat to spine - moderate   Dysplastic nevus 02/17/2022   mid back spinal sup - mod   Dysplastic nevus 02/17/2022   mid back spinal inf - mod   Dysplastic nevus 02/17/2022   right mid back 12.5 cm lat to spine inf - mod to severe   Dysplastic nevus 02/17/2022   right mid back 12.5 cm lat to spine sup - severe - Shave removal 04/10/2022   History of bronchitis    as a child   History of colon polyps  History of kidney stones    Hyperlipidemia    takes Crestor daily   Hypertension    takes Metoprolol daily   PONV (postoperative nausea and vomiting)    Nausea with one surgery   Past Surgical History:  Procedure Laterality Date   COLONOSCOPY     COLONOSCOPY     several   COLONOSCOPY W/ POLYPECTOMY     COLONOSCOPY WITH PROPOFOL N/A 08/26/2018   Procedure: COLONOSCOPY WITH PROPOFOL;  Surgeon: Lucilla Lame, MD;  Location: [facility] ENDOSCOPY;  Service: Endoscopy;  Laterality: N/A;   cyst removed from under left arm     KNEE ARTHROSCOPY     right    KNEE ARTHROSCOPY W/ ACL RECONSTRUCTION     LUMBAR LAMINECTOMY/DECOMPRESSION MICRODISCECTOMY  08/11/2012   Procedure: LUMBAR LAMINECTOMY/DECOMPRESSION MICRODISCECTOMY 1 LEVEL;  Surgeon: Hosie Spangle, MD;  Location: [place] [facility];  Service: Neurosurgery;  Laterality: Right;  RIGHT L45 laminotomy and  microdiskectomy   LUMBAR LAMINECTOMY/DECOMPRESSION MICRODISCECTOMY Right 01/29/2015   Procedure: LUMBAR LAMINECTOMY/DECOMPRESSION MICRODISCECTOMY 1 LEVEL;  Surgeon: Hosie Spangle, MD;  Location: [place] [facility];  Service: Neurosurgery;  Laterality: Right;  Right L45 Laminotomy and microdisketomy   SHOULDER SURGERY     right    TONSILLECTOMY     Family History  Problem Relation Age of Onset   Hyperlipidemia Mother    Heart disease Mother    Congestive Heart Failure Mother    Colon cancer Father    Social History   Socioeconomic History   Marital status: Married    Spouse name: Not on file   Number of children: Not on file   Years of education: Not on file   Highest education level: Not on file  Occupational History    Comment: retired  Tobacco Use   Smoking status: Never   Smokeless tobacco: Never  Vaping Use   Vaping Use: Never used  Substance and Sexual Activity   Alcohol use: Yes    Alcohol/week: 16.0 standard drinks of alcohol    Types: 14 Glasses of wine, 2 Shots of liquor per week   Drug use: No   Sexual activity: Yes  Other Topics Concern   Not on file  Social History Narrative   Not on file   Social Determinants of Health   Financial Resource Strain: Low Risk  (07/12/2022)   Overall Financial Resource Strain (CARDIA)    Difficulty of Paying Living Expenses: Not hard at all  Food Insecurity: No Food Insecurity (07/12/2022)   Hunger Vital Sign    Worried About Running Out of Food in the Last Year: Never true    Ran Out of Food in the Last Year: Never true  Transportation Needs: No Transportation Needs (07/12/2022)   PRAPARE - Hydrologist (Medical): No    Lack of Transportation (Non-Medical): No  Physical Activity: Insufficiently Active (07/12/2022)   Exercise Vital Sign    Days of Exercise per Week: 7 days    Minutes of Exercise per Session: 20 min  Stress: No Stress Concern Present (07/12/2022)   [place]    Feeling of Stress : Not at all  Social Connections: Unknown (07/12/2022)   Social Connection and Isolation Panel [NHANES]    Frequency of Communication with Friends and Family: Not on file    Frequency of Social Gatherings with Friends and Family: Not on file    Attends Religious Services: Not on file    Active  Member of Clubs or Organizations: Not on file    Attends Archivist Meetings: Not on file    Marital Status: Married    Tobacco Counseling Counseling given: Not Answered   Clinical Intake:  Pre-visit preparation completed: Yes        Diabetes: No  How often do you need to have someone help you when you read instructions, pamphlets, or other written materials from your doctor or pharmacy?: 1 - Never  Interpreter Needed?: No      Activities of Daily Living    07/12/2022    9:18 AM  In your present state of health, do you have any difficulty performing the following activities:  Hearing? 0  Vision? 0  Difficulty concentrating or making decisions? 0  Walking or climbing stairs? 0  Comment Chronic back pain. Paces self with activity.  Dressing or bathing? 0  Doing errands, shopping? 0  Preparing Food and eating ? N  Using the Toilet? N  In the past six months, have you accidently leaked urine? N  Do you have problems with loss of bowel control? N  Managing your Medications? N  Managing your Finances? N  Housekeeping or managing your Housekeeping? N    Patient Care Team: Leone Haven, MD as PCP - General (Family Medicine)  Indicate any recent Medical Services you may have received from other than [facility] providers in the past year (date may be approximate).     Assessment:   This is a routine wellness examination for Ronald Hunter.  Virtual Visit via Telephone Note  I connected with  Ronald Hunter on 07/12/22 at  9:00 AM EDT by telephone and verified that I am speaking with the correct person using two  identifiers.  Location: Patient: home Provider: office Persons participating in the virtual visit: patient/Nurse Health Advisor   I discussed the limitations of performing an evaluation and management service by telehealth. We continued and completed visit with audio only. Some vital signs may be absent or patient reported.   Hearing/Vision screen Hearing Screening - Comments:: Patient is able to hear conversational tones without difficulty. No issues reported. Vision Screening - Comments:: Follow[facility], Dr. George Ina Wears corrective lenses as needed   Dietary issues and exercise activities discussed: Current Exercise Habits: Home exercise routine, Type of exercise: walking, Time (Minutes): 20, Frequency (Times/Week): 7, Weekly Exercise (Minutes/Week): 140, Intensity: Mild Healthy diet Good water intake   Goals Addressed               This Visit's Progress     Patient Stated     Follow up with Primary Care Provider (pt-stated)        Maintain Healthy Lifestyle Stay active        Depression Screen    07/12/2022    9:15 AM 05/23/2022    9:32 AM 11/06/2021    8:11 AM 07/11/2021    8:49 AM 10/19/2020    8:41 AM 07/08/2020    8:44 AM 04/18/2020    8:14 AM  PHQ 2/9 Scores  PHQ - 2 Score 0 0 0 0 0 0 0    Fall Risk    07/12/2022    9:17 AM 05/23/2022    9:32 AM 07/04/2021    8:38 AM 10/19/2020    8:41 AM 07/08/2020    8:47 AM  Fall Risk   Falls in the past year? 0 0 1 0 0  Number falls in past yr: 0 0 0 0  0  Injury with Fall?  0     Risk for fall due to :  No Fall Risks     Follow up Falls evaluation completed Falls evaluation completed Falls evaluation completed Falls evaluation completed Falls evaluation completed    Corn: Home free of loose throw rugs in walkways, pet beds, electrical cords, etc? Yes  Adequate lighting in your home to reduce risk of falls? Yes   ASSISTIVE DEVICES UTILIZED TO PREVENT  FALLS: Life alert? No  Use of a cane, walker or w/c? No   TIMED UP AND GO: Was the test performed? No .   Cognitive Function: Patient is alert and oriented x3.      07/08/2020    8:49 AM  MMSE - Mini Mental State Exam  Not completed: Unable to complete        Immunizations Immunization History  Administered Date(s) Administered   Influenza, High Dose Seasonal PF 10/27/2018, 10/07/2019   Influenza,inj,Quad PF,6+ Mos 10/23/2017   Influenza-Unspecified 10/05/2020, 10/17/2021   PFIZER(Purple Top)SARS-COV-2 Vaccination 01/26/2020, 02/16/2020, 10/19/2020, 04/27/2021   Pfizer Covid-19 Vaccine Bivalent Booster 52yr & up 09/29/2021   Pneumococcal Conjugate-13 04/24/2018   Pneumococcal Polysaccharide-23 07/14/2019   Tdap 04/24/2018   Shingrix Completed?: No.    Education has been provided regarding the importance of this vaccine. Patient has been advised to call insurance company to determine out of pocket expense if they have not yet received this vaccine. Advised may also receive vaccine at local pharmacy or Health Dept. Verbalized acceptance and understanding.  Screening Tests Health Maintenance  Topic Date Due   Zoster Vaccines- Shingrix (1 of 2) 10/12/2022 (Originally 02/11/1970)   INFLUENZA VACCINE  07/17/2022   COLONOSCOPY (Pts 45-449yrInsurance coverage will need to be confirmed)  08/27/2023   TETANUS/TDAP  04/24/2028   Pneumonia Vaccine 69Years old  Completed   COVID-19 Vaccine  Completed   Hepatitis C Screening  Completed   HPV VACCINES  Aged Out   Health Maintenance There are no preventive care reminders to display for this patient.  Lung Cancer Screening: (Low Dose CT Chest recommended if Age 69-80ears, 30 pack-year currently smoking OR have quit w/in 15years.) does not qualify.   Vision Screening: Recommended annual ophthalmology exams for early detection of glaucoma and other disorders of the eye.  Dental Screening: Recommended annual dental exams for  proper oral hygiene  Community Resource Referral / Chronic Care Management: CRR required this visit?  No   CCM required this visit?  No      Plan:   Keep all routine maintenance appointments.   I have personally reviewed and noted the following in the patient's chart:   Medical and social history Use of alcohol, tobacco or illicit drugs  Current medications and supplements including opioid prescriptions. Patient is not currently taking opioid prescriptions. Functional ability and status Nutritional status Physical activity Advanced directives List of other physicians Hospitalizations, surgeries, and ER visits in previous 12 months Vitals Screenings to include cognitive, depression, and falls Referrals and appointments  In addition, I have reviewed and discussed with patient certain preventive protocols, quality metrics, and best practice recommendations. A written personalized care plan for preventive services as well as general preventive health recommendations were provided to patient.     OBVarney BilesLPN   06/16/21/4825

## 2022-09-30 ENCOUNTER — Other Ambulatory Visit: Payer: Self-pay | Admitting: Family Medicine

## 2022-09-30 DIAGNOSIS — I10 Essential (primary) hypertension: Secondary | ICD-10-CM

## 2022-09-30 DIAGNOSIS — E785 Hyperlipidemia, unspecified: Secondary | ICD-10-CM

## 2022-10-22 ENCOUNTER — Ambulatory Visit: Payer: Medicare Other | Admitting: Dermatology

## 2022-10-22 DIAGNOSIS — Z23 Encounter for immunization: Secondary | ICD-10-CM | POA: Diagnosis not present

## 2022-11-19 ENCOUNTER — Other Ambulatory Visit: Payer: Medicare Other

## 2022-11-19 DIAGNOSIS — I10 Essential (primary) hypertension: Secondary | ICD-10-CM

## 2022-11-20 LAB — BASIC METABOLIC PANEL
BUN: 15 mg/dL (ref 7–25)
CO2: 25 mmol/L (ref 20–32)
Calcium: 9.3 mg/dL (ref 8.6–10.3)
Chloride: 106 mmol/L (ref 98–110)
Creat: 0.7 mg/dL (ref 0.70–1.35)
Glucose, Bld: 93 mg/dL (ref 65–99)
Potassium: 4.6 mmol/L (ref 3.5–5.3)
Sodium: 141 mmol/L (ref 135–146)

## 2022-11-21 ENCOUNTER — Ambulatory Visit: Payer: Medicare Other | Admitting: Dermatology

## 2022-11-21 ENCOUNTER — Ambulatory Visit (INDEPENDENT_AMBULATORY_CARE_PROVIDER_SITE_OTHER): Payer: Medicare Other | Admitting: Dermatology

## 2022-11-21 DIAGNOSIS — L578 Other skin changes due to chronic exposure to nonionizing radiation: Secondary | ICD-10-CM

## 2022-11-21 DIAGNOSIS — Z86018 Personal history of other benign neoplasm: Secondary | ICD-10-CM

## 2022-11-21 DIAGNOSIS — L821 Other seborrheic keratosis: Secondary | ICD-10-CM | POA: Diagnosis not present

## 2022-11-21 DIAGNOSIS — Z1283 Encounter for screening for malignant neoplasm of skin: Secondary | ICD-10-CM

## 2022-11-21 DIAGNOSIS — D225 Melanocytic nevi of trunk: Secondary | ICD-10-CM | POA: Diagnosis not present

## 2022-11-21 DIAGNOSIS — D492 Neoplasm of unspecified behavior of bone, soft tissue, and skin: Secondary | ICD-10-CM

## 2022-11-21 DIAGNOSIS — L814 Other melanin hyperpigmentation: Secondary | ICD-10-CM

## 2022-11-21 DIAGNOSIS — D229 Melanocytic nevi, unspecified: Secondary | ICD-10-CM | POA: Diagnosis not present

## 2022-11-21 NOTE — Patient Instructions (Addendum)
Wound Care Instructions  Cleanse wound gently with soap and water once a day then pat dry with clean gauze. Apply a thin coat of Petrolatum (petroleum jelly, "Vaseline") over the wound (unless you have an allergy to this). We recommend that you use a new, sterile tube of Vaseline. Do not pick or remove scabs. Do not remove the yellow or white "healing tissue" from the base of the wound.  Cover the wound with fresh, clean, nonstick gauze and secure with paper tape. You may use Band-Aids in place of gauze and tape if the wound is small enough, but would recommend trimming much of the tape off as there is often too much. Sometimes Band-Aids can irritate the skin.  You should call the office for your biopsy report after 1 week if you have not already been contacted.  If you experience any problems, such as abnormal amounts of bleeding, swelling, significant bruising, significant pain, or evidence of infection, please call the office immediately.  FOR ADULT SURGERY PATIENTS: If you need something for pain relief you may take 1 extra strength Tylenol (acetaminophen) AND 2 Ibuprofen (200mg each) together every 4 hours as needed for pain. (do not take these if you are allergic to them or if you have a reason you should not take them.) Typically, you may only need pain medication for 1 to 3 days.     Due to recent changes in healthcare laws, you may see results of your pathology and/or laboratory studies on MyChart before the doctors have had a chance to review them. We understand that in some cases there may be results that are confusing or concerning to you. Please understand that not all results are received at the same time and often the doctors may need to interpret multiple results in order to provide you with the best plan of care or course of treatment. Therefore, we ask that you please give us 2 business days to thoroughly review all your results before contacting the office for clarification. Should  we see a critical lab result, you will be contacted sooner.   If You Need Anything After Your Visit  If you have any questions or concerns for your doctor, please call our main line at [phone] and press option 4 to reach your doctor's medical assistant. If no one answers, please leave a voicemail as directed and we will return your call as soon as possible. Messages left after 4 pm will be answered the following business day.   You may also send us a message via MyChart. We typically respond to MyChart messages within 1-2 business days.  For prescription refills, please ask your pharmacy to contact our office. Our fax number is [phone].  If you have an urgent issue when the clinic is closed that cannot wait until the next business day, you can page your doctor at the number below.    Please note that while we do our best to be available for urgent issues outside of office hours, we are not available 24/7.   If you have an urgent issue and are unable to reach us, you may choose to seek medical care at your doctor's office, retail clinic, urgent care center, or emergency room.  If you have a medical emergency, please immediately call 911 or go to the emergency department.  Pager Numbers  - Dr. Kowalski: [phone]  - Dr. Moye: [phone]  - Dr. Stewart: [phone]  In the event of inclement weather, please call our main line at   [phone] for an update on the status of any delays or closures.  Dermatology Medication Tips: Please keep the boxes that topical medications come in in order to help keep track of the instructions about where and how to use these. Pharmacies typically print the medication instructions only on the boxes and not directly on the medication tubes.   If your medication is too expensive, please contact our office at [phone] option 4 or send us a message through MyChart.   We are unable to tell what your co-pay for medications will be in  advance as this is different depending on your insurance coverage. However, we may be able to find a substitute medication at lower cost or fill out paperwork to get insurance to cover a needed medication.   If a prior authorization is required to get your medication covered by your insurance company, please allow us 1-2 business days to complete this process.  Drug prices often vary depending on where the prescription is filled and some pharmacies may offer cheaper prices.  The website www.goodrx.com contains coupons for medications through different pharmacies. The prices here do not account for what the cost may be with help from insurance (it may be cheaper with your insurance), but the website can give you the price if you did not use any insurance.  - You can print the associated coupon and take it with your prescription to the pharmacy.  - You may also stop by our office during regular business hours and pick up a GoodRx coupon card.  - If you need your prescription sent electronically to a different pharmacy, notify our office through [place] MyChart or by phone at [phone] option 4.     Si Usted Necesita Algo Despus de Su Visita  Tambin puede enviarnos un mensaje a travs de MyChart. Por lo general respondemos a los mensajes de MyChart en el transcurso de 1 a 2 das hbiles.  Para renovar recetas, por favor pida a su farmacia que se ponga en contacto con nuestra oficina. Nuestro nmero de fax es el [phone].  Si tiene un asunto urgente cuando la clnica est cerrada y que no puede esperar hasta el siguiente da hbil, puede llamar/localizar a su doctor(a) al nmero que aparece a continuacin.   Por favor, tenga en cuenta que aunque hacemos todo lo posible para estar disponibles para asuntos urgentes fuera del horario de oficina, no estamos disponibles las 24 horas del da, los 7 das de la semana.   Si tiene un problema urgente y no puede comunicarse con nosotros, puede  optar por buscar atencin mdica  en el consultorio de su doctor(a), en una clnica privada, en un centro de atencin urgente o en una sala de emergencias.  Si tiene una emergencia mdica, por favor llame inmediatamente al 911 o vaya a la sala de emergencias.  Nmeros de bper  - Dr. Kowalski: [phone]  - Dra. Moye: [phone]  - Dra. Stewart: [phone]  En caso de inclemencias del tiempo, por favor llame a nuestra lnea principal al [phone] para una actualizacin sobre el estado de cualquier retraso o cierre.  Consejos para la medicacin en dermatologa: Por favor, guarde las cajas en las que vienen los medicamentos de uso tpico para ayudarle a seguir las instrucciones sobre dnde y cmo usarlos. Las farmacias generalmente imprimen las instrucciones del medicamento slo en las cajas y no directamente en los tubos del medicamento.   Si su medicamento es muy caro, por favor, pngase en contacto con   nuestra oficina llamando al [phone] y presione la opcin 4 o envenos un mensaje a travs de MyChart.   No podemos decirle cul ser su copago por los medicamentos por adelantado ya que esto es diferente dependiendo de la cobertura de su seguro. Sin embargo, es posible que podamos encontrar un medicamento sustituto a menor costo o llenar un formulario para que el seguro cubra el medicamento que se considera necesario.   Si se requiere una autorizacin previa para que su compaa de seguros cubra su medicamento, por favor permtanos de 1 a 2 das hbiles para completar este proceso.  Los precios de los medicamentos varan con frecuencia dependiendo del lugar de dnde se surte la receta y alguna farmacias pueden ofrecer precios ms baratos.  El sitio web www.goodrx.com tiene cupones para medicamentos de diferentes farmacias. Los precios aqu no tienen en cuenta lo que podra costar con la ayuda del seguro (puede ser ms barato con su seguro), pero el sitio web puede darle el  precio si no utiliz ningn seguro.  - Puede imprimir el cupn correspondiente y llevarlo con su receta a la farmacia.  - Tambin puede pasar por nuestra oficina durante el horario de atencin regular y recoger una tarjeta de cupones de GoodRx.  - Si necesita que su receta se enve electrnicamente a una farmacia diferente, informe a nuestra oficina a travs de MyChart de [place] o por telfono llamando al [phone] y presione la opcin 4.  

## 2022-11-21 NOTE — Progress Notes (Signed)
Follow-Up Visit   Subjective  Ronald Hunter is a 71 y.o. male who presents for the following: Annual Exam. Hx of Dysplastic nevus, The patient presents for Upper Body Skin Exam (UBSE) for skin cancer screening and mole check.  The patient has spots, moles and lesions to be evaluated, some may be new or changing and the patient has concerns that these could be cancer.   The following portions of the chart were reviewed this encounter and updated as appropriate:   Tobacco  Allergies  Meds  Problems  Med Hx  Surg Hx  Fam Hx     Review of Systems:  No other skin or systemic complaints except as noted in HPI or Assessment and Plan.  Objective  Well appearing patient in no apparent distress; mood and affect are within normal limits.  All skin waist up examined.  right back lateral near the side 0.8 cm irregular macule         Assessment & Plan  Neoplasm of skin right back lateral near the side  Epidermal / dermal shaving  Lesion diameter (cm):  0.8 Informed consent: discussed and consent obtained   Timeout: patient name, date of birth, surgical site, and procedure verified   Procedure prep:  Patient was prepped and draped in usual sterile fashion Prep type:  Isopropyl alcohol Anesthesia: the lesion was anesthetized in a standard fashion   Anesthetic:  1% lidocaine w/ epinephrine 1-100,000 buffered w/ 8.4% NaHCO3 Hemostasis achieved with: pressure, aluminum chloride and electrodesiccation   Outcome: patient tolerated procedure well   Post-procedure details: sterile dressing applied and wound care instructions given   Dressing type: bandage and petrolatum    Specimen 1 - Surgical pathology Differential Diagnosis: R/O Dysplastic nevus   Check Margins: No  Lentigines - Scattered tan macules - Due to sun exposure - Benign-appearing, observe - Recommend daily broad spectrum sunscreen SPF 30+ to sun-exposed areas, reapply every 2 hours as needed. - Call for any  changes  Seborrheic Keratoses - Stuck-on, waxy, tan-brown papules and/or plaques  - Benign-appearing - Discussed benign etiology and prognosis. - Observe - Call for any changes  Melanocytic Nevi - Tan-brown and/or pink-flesh-colored symmetric macules and papules - Benign appearing on exam today - Observation - Call clinic for new or changing moles - Recommend daily use of broad spectrum spf 30+ sunscreen to sun-exposed areas.   Hemangiomas - Red papules - Discussed benign nature - Observe - Call for any changes  Actinic Damage - Chronic condition, secondary to cumulative UV/sun exposure - diffuse scaly erythematous macules with underlying dyspigmentation - Recommend daily broad spectrum sunscreen SPF 30+ to sun-exposed areas, reapply every 2 hours as needed.  - Staying in the shade or wearing long sleeves, sun glasses (UVA+UVB protection) and wide brim hats (4-inch brim around the entire circumference of the hat) are also recommended for sun protection.  - Call for new or changing lesions.  History of Dysplastic Nevi Multiple see history  - No evidence of recurrence today - Recommend regular full body skin exams - Recommend daily broad spectrum sunscreen SPF 30+ to sun-exposed areas, reapply every 2 hours as needed.  - Call if any new or changing lesions are noted between office visits   Skin cancer screening performed today.   Return in about 6 months (around 05/23/2023) for UBSE, hx of Dysplastic nevus .  Marta Lamas, CMA, am acting as scribe for Sarina Ser, MD .  Documentation: I have reviewed the above documentation for  accuracy and completeness, and I agree with the above.  Sarina Ser, MD

## 2022-11-23 ENCOUNTER — Encounter: Payer: Self-pay | Admitting: Family Medicine

## 2022-11-23 ENCOUNTER — Ambulatory Visit (INDEPENDENT_AMBULATORY_CARE_PROVIDER_SITE_OTHER): Payer: Medicare Other | Admitting: Family Medicine

## 2022-11-23 VITALS — BP 130/70 | HR 67 | Temp 98.3°F | Ht 72.0 in | Wt 238.2 lb

## 2022-11-23 DIAGNOSIS — I10 Essential (primary) hypertension: Secondary | ICD-10-CM

## 2022-11-23 DIAGNOSIS — M545 Low back pain, unspecified: Secondary | ICD-10-CM

## 2022-11-23 DIAGNOSIS — E785 Hyperlipidemia, unspecified: Secondary | ICD-10-CM

## 2022-11-23 DIAGNOSIS — G8929 Other chronic pain: Secondary | ICD-10-CM | POA: Diagnosis not present

## 2022-11-23 MED ORDER — LISINOPRIL 40 MG PO TABS: 40.0000 mg | ORAL_TABLET | Freq: Every day | ORAL | 3 refills | Status: DC

## 2022-11-23 MED ORDER — GABAPENTIN 100 MG PO CAPS: 100.0000 mg | ORAL_CAPSULE | Freq: Every evening | ORAL | 1 refills | Status: DC | PRN

## 2022-11-23 MED ORDER — ROSUVASTATIN CALCIUM 40 MG PO TABS: 40.0000 mg | ORAL_TABLET | Freq: Every day | ORAL | 3 refills | Status: DC

## 2022-11-23 NOTE — Patient Instructions (Signed)
Nice to see you. Please check your blood pressure daily for the next 2 weeks and send me your readings.

## 2022-11-23 NOTE — Assessment & Plan Note (Signed)
Systolic is borderline.  Discussed checking his blood pressure daily for the next 2 weeks and sending me his readings.  For now he will continue lisinopril 40 mg daily.  Lab work reviewed.

## 2022-11-23 NOTE — Assessment & Plan Note (Signed)
Patient has chronic nerve pain in his right foot.  He can continue gabapentin 100 mg by mouth at bedtime as needed.

## 2022-11-23 NOTE — Progress Notes (Signed)
[name], MD Phone: [phone]  MATHIS CASHMAN is a 71 y.o. male who presents today for f/u.  HYPERTENSION Disease Monitoring Home BP Monitoring similar to today, though only checking once a week Chest pain- no    Dyspnea- no Medications Compliance-  taking lisinopril.   Edema- no BMET    Component Value Date/Time   NA 141 11/19/2022 0759   NA 140 06/16/2012 1449   K 4.6 11/19/2022 0759   K 3.8 06/16/2012 1449   CL 106 11/19/2022 0759   CL 106 06/16/2012 1449   CO2 25 11/19/2022 0759   CO2 25 06/16/2012 1449   GLUCOSE 93 11/19/2022 0759   GLUCOSE 103 (H) 06/16/2012 1449   BUN 15 11/19/2022 0759   BUN 15 06/16/2012 1449   CREATININE 0.70 11/19/2022 0759   CALCIUM 9.3 11/19/2022 0759   CALCIUM 8.7 06/16/2012 1449   GFRNONAA >60 04/15/2021 0454   GFRNONAA >60 06/16/2012 1449   GFRAA >90 01/28/2015 1150   GFRAA >60 06/16/2012 1449   Chronic low back pain: Patient has chronic back pain with associated right foot nerve pain from prior surgery on his back.  He notes gabapentin is helpful though he only takes it nightly as needed.  Social History   Tobacco Use  Smoking Status Never  Smokeless Tobacco Never    Current Outpatient Medications on File Prior to Visit  Medication Sig Dispense Refill   aspirin 81 MG tablet Take 81 mg by mouth daily.     hydrocortisone 2.5 % cream Apply topically 3 (three) times a week. Apply to scaly areas on face 3 nights weekly, Tuesday, Thursday and Saturdays 30 g 6   Multiple Vitamin (MULTIVITAMIN WITH MINERALS) TABS Take 1 tablet by mouth daily.     No current facility-administered medications on file prior to visit.     ROS see history of present illness  Objective  Physical Exam Vitals:   11/23/22 0907  BP: 130/70  Pulse: 67  Temp: 98.3 F (36.8 C)  SpO2: 99%    BP Readings from Last 3 Encounters:  11/23/22 130/70  05/23/22 130/80  11/06/21 130/80   Wt Readings from Last 3 Encounters:  11/23/22 238 lb 3.2 oz  (108 kg)  07/12/22 235 lb (106.6 kg)  05/23/22 235 lb 9.6 oz (106.9 kg)    Physical Exam Constitutional:      General: He is not in acute distress.    Appearance: He is not diaphoretic.  Cardiovascular:     Rate and Rhythm: Normal rate and regular rhythm.     Heart sounds: Normal heart sounds.  Pulmonary:     Effort: Pulmonary effort is normal.     Breath sounds: Normal breath sounds.  Skin:    General: Skin is warm and dry.  Neurological:     Mental Status: He is alert.      Assessment/Plan: Please see individual problem list.  Problem List Items Addressed This Visit     Chronic low back pain (Chronic)    Patient has chronic nerve pain in his right foot.  He can continue gabapentin 100 mg by mouth at bedtime as needed.      Relevant Medications   gabapentin (NEURONTIN) 100 MG capsule   Essential hypertension - Primary (Chronic)    Systolic is borderline.  Discussed checking his blood pressure daily for the next 2 weeks and sending me his readings.  For now he will continue lisinopril 40 mg daily.  Lab work reviewed.  Relevant Medications   rosuvastatin (CRESTOR) 40 MG tablet   lisinopril (ZESTRIL) 40 MG tablet   Other Relevant Orders   Comp Met (CMET)   Hyperlipidemia (Chronic)   Relevant Medications   rosuvastatin (CRESTOR) 40 MG tablet   lisinopril (ZESTRIL) 40 MG tablet   Other Relevant Orders   Comp Met (CMET)   Lipid panel     Return in about 6 months (around 05/25/2023) for Hypertension/hyperlipidemia, labs 2 days prior.   [name], MD [place]

## 2022-11-24 DIAGNOSIS — Z23 Encounter for immunization: Secondary | ICD-10-CM | POA: Diagnosis not present

## 2022-12-02 ENCOUNTER — Encounter: Payer: Self-pay | Admitting: Dermatology

## 2022-12-03 ENCOUNTER — Telehealth: Payer: Self-pay

## 2022-12-03 NOTE — Telephone Encounter (Signed)
Advised patient of results/hd  

## 2022-12-03 NOTE — Telephone Encounter (Signed)
-----   Message from Ralene Bathe, MD sent at 12/01/2022  2:22 PM EST ----- Diagnosis Skin , right back lateral near the side [place], LIMITED MARGINS FREE  Moderate dysplastic Recheck next visit

## 2023-03-15 IMAGING — CT CT ANGIO HEAD-NECK (W OR W/O PERF)
3 of 7 series · 9 of 34 positions shown · IV contrast (APPLIED)
Comparison: Prior head CT from earlier the same day.

CLINICAL DATA: Initial evaluation for acute dizziness, vertigo,
vomiting.

EXAM:
CT ANGIOGRAPHY HEAD AND NECK
TECHNIQUE: Multidetector CT imaging of the head and neck was performed using
the standard protocol during bolus administration of intravenous
contrast. Multiplanar CT image reconstructions and MIPs were
obtained to evaluate the vascular anatomy. Carotid stenosis
measurements (when applicable) are obtained utilizing NASCET
criteria, using the distal internal carotid diameter as the
denominator.
CONTRAST:  75mL OMNIPAQUE IOHEXOL 350 MG/ML SOLN

[Series 6: cta head neck · axial · 0.54mm/px · z∈[-212,-76]mm · 2 of 204 slices shown]
[im 68/204  soft-tissue]
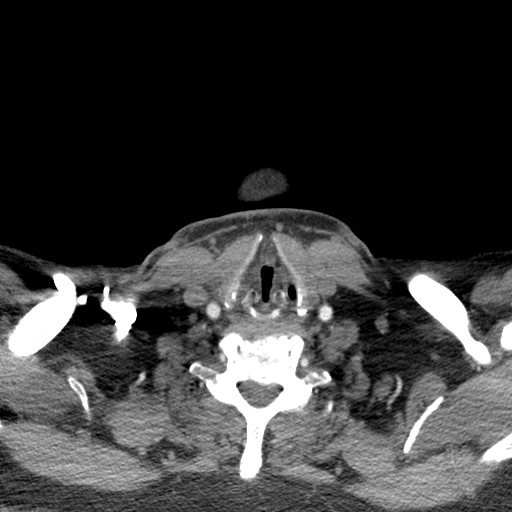
[im 136/204  soft-tissue]
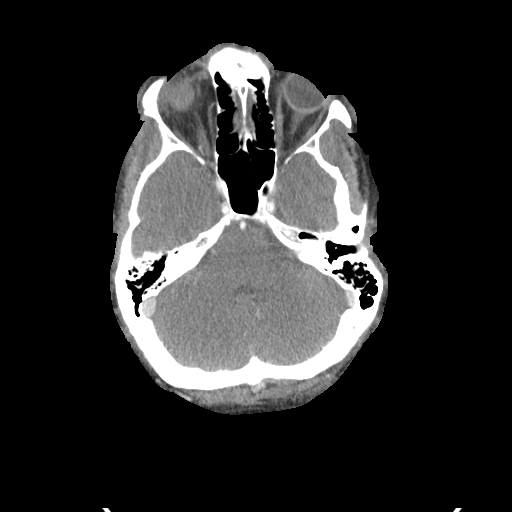

[Series 8: ax thin · axial · 0.42mm/px · z∈[-296,-11]mm · 6 of 401 slices shown]
[im 58/401  soft-tissue]
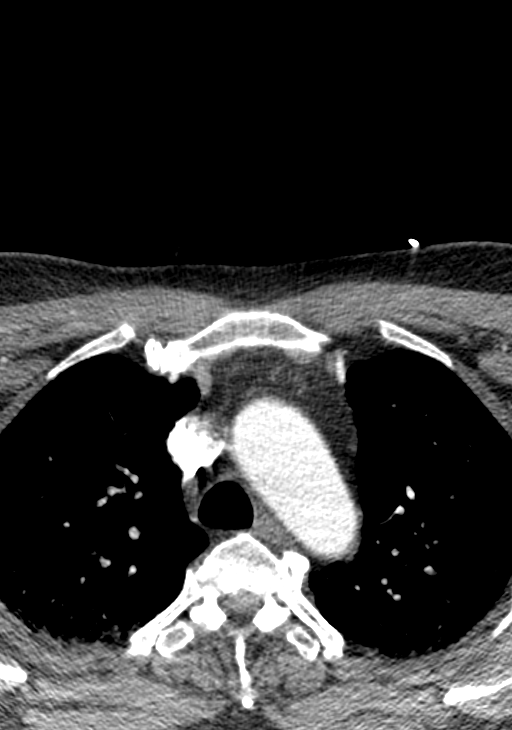
[im 115/401  bone]
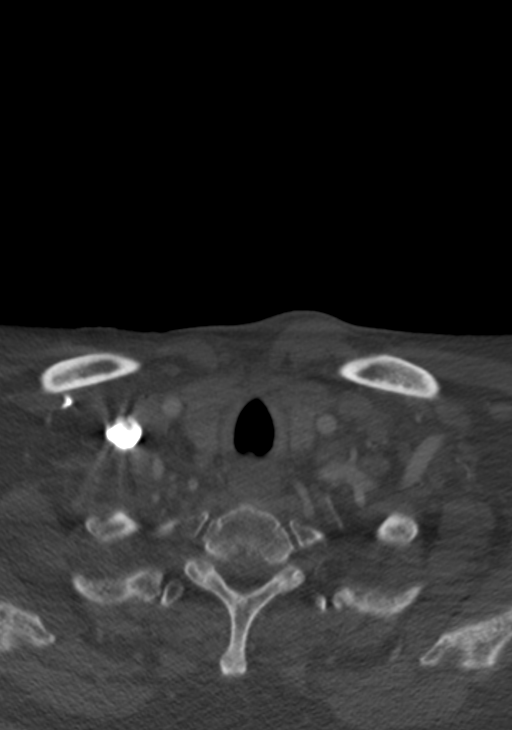
[im 172/401  soft-tissue]
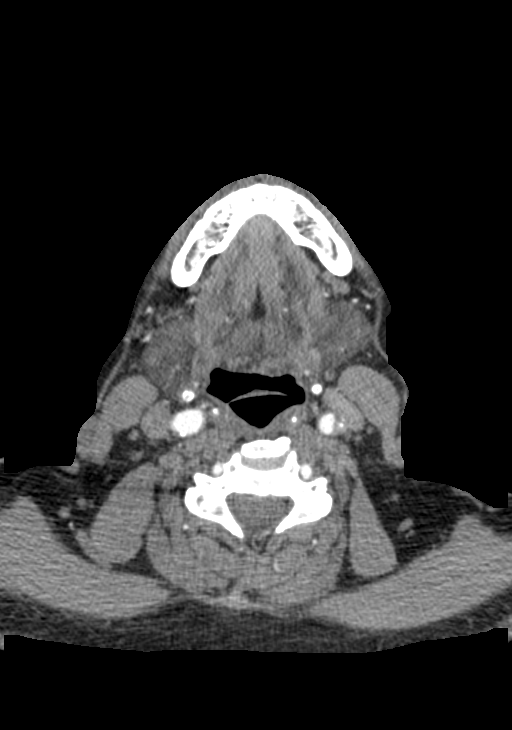
[im 229/401  bone]
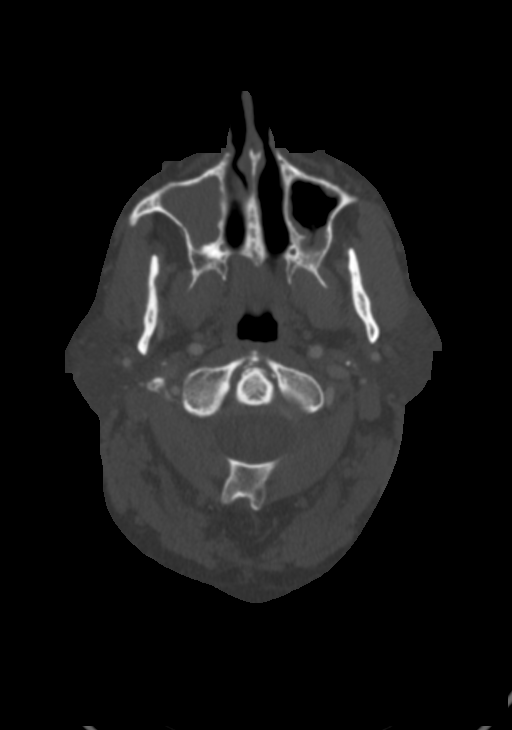
[im 286/401  soft-tissue]
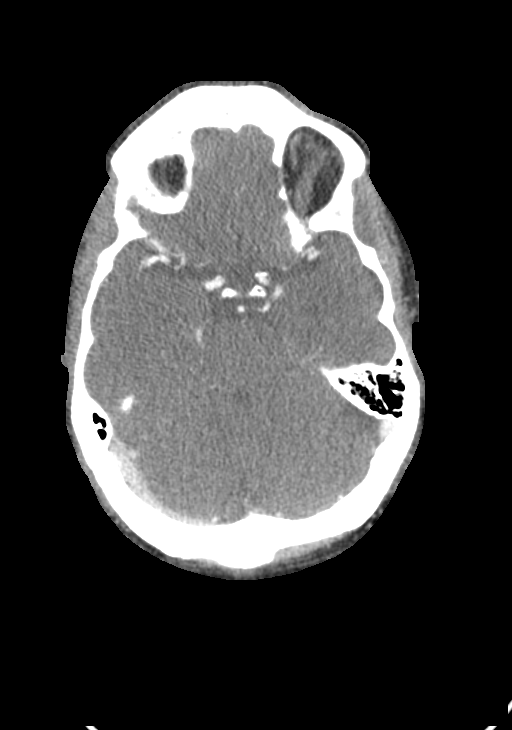
[im 343/401  bone]
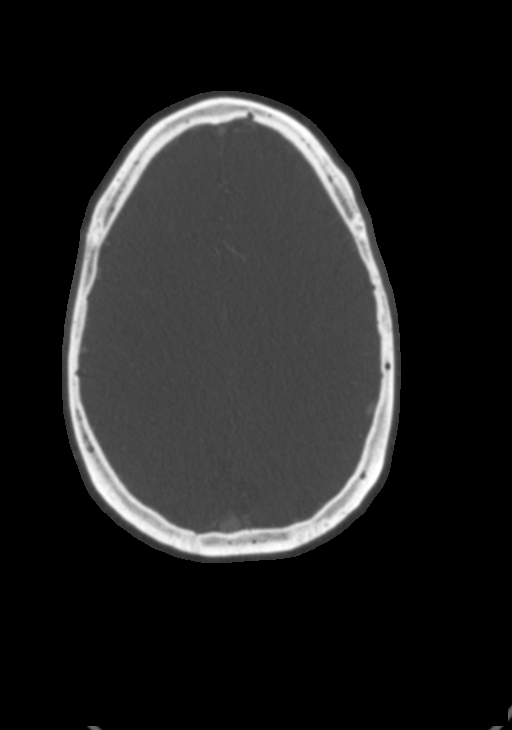

[Series 10: sagittal thin · sagittal · 0.56mm/px · 1 of 211 slices shown]
[im 108/211  soft-tissue]
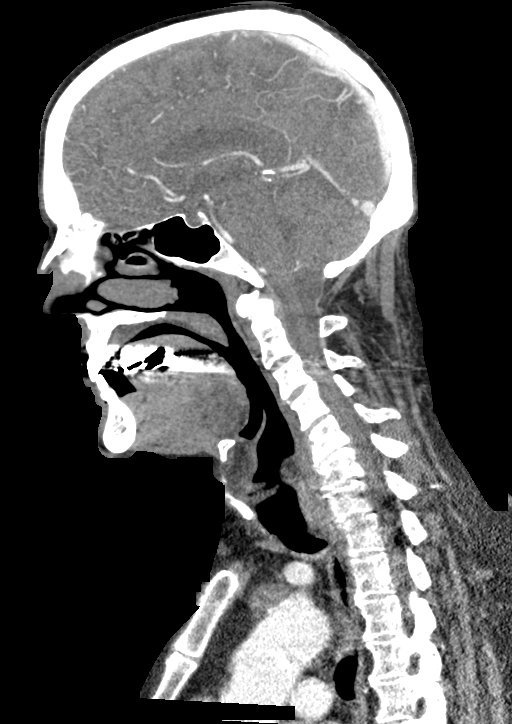

[9 of 34 positions shown; findings below may reference images not displayed]

FINDINGS: CTA NECK FINDINGS

Aortic arch: Visualized aortic arch normal in caliber with normal
branch pattern. No hemodynamically significant stenosis seen about
the origin of the great vessels.

Right carotid system: Right common carotid artery patent from its
origin to the bifurcation without stenosis. Mild for age
atheromatous plaque about the right carotid bulb without significant
stenosis. Right ICA widely patent distally without stenosis,
dissection or occlusion.

Left carotid system: Left common carotid artery widely patent from
its origin to the bifurcation without stenosis. Eccentric mixed
plaque at the left carotid bulb without hemodynamically significant
stenosis. Left ICA widely patent distally without stenosis,
dissection or occlusion.

Vertebral arteries: Both vertebral arteries arise from the
subclavian arteries. No proximal subclavian artery stenosis.
Atheromatous plaque at the origins of both vertebral arteries with
no more than mild stenosis. Vertebral arteries otherwise patent
distally without stenosis, dissection or occlusion.

Skeleton: No visible acute osseous abnormality. No discrete or
worrisome osseous lesions. Moderate cervical spondylosis present at
C5-6 and C6-7 without high-grade spinal stenosis.

Other neck: No other acute soft tissue abnormality within the neck.
No mass or adenopathy. Chronic maxillary sinusitis noted, right
worse than left.

Upper chest: Visualized upper chest demonstrates no acute finding.
Mild atelectatic changes noted within the visualized lungs.

Review of the MIP images confirms the above findings

CTA HEAD FINDINGS

Anterior circulation: Petrous segments patent bilaterally. Mild
atheromatous change within the carotid siphons without
hemodynamically significant stenosis. A1 segments widely patent.
Normal anterior communicating artery complex. Anterior cerebral
arteries patent to their distal aspects without stenosis. No M1
stenosis or occlusion. Normal MCA bifurcations. Distal MCA branches
well perfused and symmetric.

Posterior circulation: Both V4 segments patent to the
vertebrobasilar junction without stenosis. Left vertebral artery
slightly dominant. Both PICA origins patent and normal. Basilar
patent to its distal aspect without stenosis. Superior cerebellar
arteries patent bilaterally. Right PCA supplied via the basilar.
Fetal type origin of the left PCA. Both PCAs perfused to their
distal aspects without stenosis.

Venous sinuses: Grossly patent allowing for timing the contrast
bolus.

Anatomic variants: Fetal type origin of the left PCA. No
intracranial aneurysm.

Review of the MIP images confirms the above findings
IMPRESSION: 1. Negative CTA for large vessel occlusion.
2. Mild for age atheromatous change about the carotid bifurcations
and carotid siphons without hemodynamically significant stenosis.
3. Chronic maxillary sinusitis, right worse than left.

Findings communicated by telephone at the time of interpretation on
04/13/2021 at [DATE] to provider Dr. Yader Bankz.

## 2023-04-18 DIAGNOSIS — M19041 Primary osteoarthritis, right hand: Secondary | ICD-10-CM | POA: Diagnosis not present

## 2023-04-18 DIAGNOSIS — M79644 Pain in right finger(s): Secondary | ICD-10-CM | POA: Diagnosis not present

## 2023-05-23 ENCOUNTER — Other Ambulatory Visit (INDEPENDENT_AMBULATORY_CARE_PROVIDER_SITE_OTHER): Payer: Medicare Other

## 2023-05-23 DIAGNOSIS — I10 Essential (primary) hypertension: Secondary | ICD-10-CM | POA: Diagnosis not present

## 2023-05-23 DIAGNOSIS — E785 Hyperlipidemia, unspecified: Secondary | ICD-10-CM

## 2023-05-23 NOTE — Addendum Note (Signed)
Addended by: Jarvis Morgan D on: 05/23/2023 07:32 AM   Modules accepted: Orders

## 2023-05-24 LAB — COMPREHENSIVE METABOLIC PANEL
AG Ratio: 2.2 (calc) (ref 1.0–2.5)
ALT: 24 U/L (ref 9–46)
AST: 21 U/L (ref 10–35)
Albumin: 4.3 g/dL (ref 3.6–5.1)
Alkaline phosphatase (APISO): 61 U/L (ref 35–144)
BUN: 12 mg/dL (ref 7–25)
CO2: 27 mmol/L (ref 20–32)
Calcium: 9.1 mg/dL (ref 8.6–10.3)
Chloride: 106 mmol/L (ref 98–110)
Creat: 0.72 mg/dL (ref 0.70–1.28)
Globulin: 2 g/dL (calc) (ref 1.9–3.7)
Glucose, Bld: 93 mg/dL (ref 65–99)
Potassium: 4.3 mmol/L (ref 3.5–5.3)
Sodium: 141 mmol/L (ref 135–146)
Total Bilirubin: 0.7 mg/dL (ref 0.2–1.2)
Total Protein: 6.3 g/dL (ref 6.1–8.1)

## 2023-05-24 LAB — LIPID PANEL
Cholesterol: 148 mg/dL (ref <200)
HDL: 47 mg/dL (ref >=40)
LDL Cholesterol (Calc): 80 mg/dL (calc)
Non-HDL Cholesterol (Calc): 101 mg/dL (calc) (ref <130)
Total CHOL/HDL Ratio: 3.1 (calc) (ref <5.0)
Triglycerides: 117 mg/dL (ref <150)

## 2023-05-27 ENCOUNTER — Encounter: Payer: Self-pay | Admitting: Family Medicine

## 2023-05-27 ENCOUNTER — Ambulatory Visit (INDEPENDENT_AMBULATORY_CARE_PROVIDER_SITE_OTHER): Payer: Medicare Other | Admitting: Family Medicine

## 2023-05-27 VITALS — BP 120/74 | HR 62 | Temp 98.4°F | Ht 72.0 in | Wt 230.2 lb

## 2023-05-27 DIAGNOSIS — M19049 Primary osteoarthritis, unspecified hand: Secondary | ICD-10-CM | POA: Diagnosis not present

## 2023-05-27 DIAGNOSIS — M545 Low back pain, unspecified: Secondary | ICD-10-CM

## 2023-05-27 DIAGNOSIS — T63481A Toxic effect of venom of other arthropod, accidental (unintentional), initial encounter: Secondary | ICD-10-CM

## 2023-05-27 DIAGNOSIS — I10 Essential (primary) hypertension: Secondary | ICD-10-CM

## 2023-05-27 DIAGNOSIS — F439 Reaction to severe stress, unspecified: Secondary | ICD-10-CM | POA: Diagnosis not present

## 2023-05-27 DIAGNOSIS — E785 Hyperlipidemia, unspecified: Secondary | ICD-10-CM | POA: Diagnosis not present

## 2023-05-27 DIAGNOSIS — G8929 Other chronic pain: Secondary | ICD-10-CM

## 2023-05-27 DIAGNOSIS — Z125 Encounter for screening for malignant neoplasm of prostate: Secondary | ICD-10-CM

## 2023-05-27 NOTE — Assessment & Plan Note (Signed)
Chronic issue.  Adequately controlled.  Patient will continue Crestor 40 mg daily.

## 2023-05-27 NOTE — Assessment & Plan Note (Addendum)
Chronic issue.  Adequately controlled on recheck.  He will continue lisinopril 40 mg daily.  Lab work reviewed.

## 2023-05-27 NOTE — Assessment & Plan Note (Signed)
Chronic issue with nerve pain in his right foot.  He can continue gabapentin 100 mg by mouth at bedtime as needed.

## 2023-05-27 NOTE — Assessment & Plan Note (Signed)
Significant improvement since last week.  He will monitor.

## 2023-05-27 NOTE — Progress Notes (Signed)
Marikay Alar, MD Phone: [phone]  Ronald Hunter is a 72 y.o. male who presents today for f/u.  HYPERTENSION Disease Monitoring Home BP Monitoring not checking Chest pain- no    Dyspnea- no Medications Compliance-  taking lisinopril.   Edema- no BMET    Component Value Date/Time   NA 141 05/23/2023 0732   NA 140 06/16/2012 1449   K 4.3 05/23/2023 0732   K 3.8 06/16/2012 1449   CL 106 05/23/2023 0732   CL 106 06/16/2012 1449   CO2 27 05/23/2023 0732   CO2 25 06/16/2012 1449   GLUCOSE 93 05/23/2023 0732   GLUCOSE 103 (H) 06/16/2012 1449   BUN 12 05/23/2023 0732   BUN 15 06/16/2012 1449   CREATININE 0.72 05/23/2023 0732   CALCIUM 9.1 05/23/2023 0732   CALCIUM 8.7 06/16/2012 1449   GFRNONAA >60 04/15/2021 0454   GFRNONAA >60 06/16/2012 1449   GFRAA >90 01/28/2015 1150   GFRAA >60 06/16/2012 1449   HYPERLIPIDEMIA Symptoms Chest pain on exertion:  no   Medications: Compliance- taking crestor Right upper quadrant pain- no  Muscle aches- no Lipid Panel     Component Value Date/Time   CHOL 148 05/23/2023 0732   TRIG 117 05/23/2023 0732   HDL 47 05/23/2023 0732   CHOLHDL 3.1 05/23/2023 0732   VLDL 9 04/13/2021 0347   LDLCALC 80 05/23/2023 0732   LDLDIRECT 116 (H) 04/18/2020 0831   Chronic back pain: Patient has chronic back pain that he has had surgery for several times.  He notes some nerve pain in his right foot for which he takes gabapentin occasionally at night.  Notes this does help when he does take it.  Patient was stung by yellow jackets about a week ago.  It was on his right foot and ankle.  He notes the swelling has improved significantly from this.  Finger arthritis: Patient notes arthritis in his right index finger.  He notes he had fractured this in the past.  He saw orthopedics for it and notes they advised there is nothing to do for this.  Stress: Patient reports stress related to his wife having a hip replacement and then having an aortic valve open  heart surgery.  Then his wife fell and fractured her leg several weeks ago.  He also has a 70 year old [place] he is having to take care of.     05/27/2023    8:10 AM 11/23/2022    9:08 AM 07/12/2022    9:15 AM 05/23/2022    9:32 AM 11/06/2021    8:11 AM  Depression screen PHQ 2/9  Decreased Interest 0 0 0 0 0  Down, Depressed, Hopeless 1 0 0 0 0  PHQ - 2 Score 1 0 0 0 0  Altered sleeping 0      Tired, decreased energy 0      Change in appetite 0      Feeling bad or failure about yourself  0      Trouble concentrating 0      Moving slowly or fidgety/restless 0      Suicidal thoughts 0      PHQ-9 Score 1      Difficult doing work/chores Not difficult at all         Social History   Tobacco Use  Smoking Status Never  Smokeless Tobacco Never    Current Outpatient Medications on File Prior to Visit  Medication Sig Dispense Refill   aspirin 81 MG tablet Take  81 mg by mouth daily.     gabapentin (NEURONTIN) 100 MG capsule Take 1 capsule (100 mg total) by mouth at bedtime as needed. 90 capsule 1   hydrocortisone 2.5 % cream Apply topically 3 (three) times a week. Apply to scaly areas on face 3 nights weekly, Tuesday, Thursday and Saturdays 30 g 6   lisinopril (ZESTRIL) 40 MG tablet Take 1 tablet (40 mg total) by mouth daily. 90 tablet 3   Multiple Vitamin (MULTIVITAMIN WITH MINERALS) TABS Take 1 tablet by mouth daily.     rosuvastatin (CRESTOR) 40 MG tablet Take 1 tablet (40 mg total) by mouth daily. 90 tablet 3   No current facility-administered medications on file prior to visit.     ROS see history of present illness  Objective  Physical Exam Vitals:   05/27/23 0808 05/27/23 0826  BP: 138/76 120/74  Pulse: 62   Temp: 98.4 F (36.9 C)   SpO2: 98%     BP Readings from Last 3 Encounters:  05/27/23 120/74  11/23/22 130/70  05/23/22 130/80   Wt Readings from Last 3 Encounters:  05/27/23 230 lb 3.2 oz (104.4 kg)  11/23/22 238 lb 3.2 oz (108 kg)  07/12/22  235 lb (106.6 kg)    Physical Exam Constitutional:      General: He is not in acute distress.    Appearance: He is not diaphoretic.  Cardiovascular:     Rate and Rhythm: Normal rate and regular rhythm.     Heart sounds: Normal heart sounds.  Pulmonary:     Effort: Pulmonary effort is normal.     Breath sounds: Normal breath sounds.  Musculoskeletal:     Comments: Minimal right foot and ankle swelling, no left lower extremity swelling  Skin:    General: Skin is warm and dry.  Neurological:     Mental Status: He is alert.      Assessment/Plan: Please see individual problem list.  Essential hypertension Assessment & Plan: Chronic issue.  Adequately controlled on recheck.  He will continue lisinopril 40 mg daily.  Lab work reviewed.   Hyperlipidemia, unspecified hyperlipidemia type Assessment & Plan: Chronic issue.  Adequately controlled.  Patient will continue Crestor 40 mg daily.   Chronic low back pain without sciatica, unspecified back pain laterality Assessment & Plan: Chronic issue with nerve pain in his right foot.  He can continue gabapentin 100 mg by mouth at bedtime as needed.   Prostate cancer screening Assessment & Plan: Most recent PSA from 2022 was in the acceptable range.  He reports he had been following with urology for yearly screenings.  I encouraged him to contact his urologist.  I discussed that I thought that his urologist may have retired or left the area and if that was the case he should let us know and we can refer him to somebody else.   Insect stings, accidental or unintentional, initial encounter Assessment & Plan: Significant improvement since last week.  He will monitor.   Arthritis of finger Assessment & Plan: Chronic issue.  Patient has seen orthopedics.  He will monitor.   Stress Assessment & Plan: Patient with numerous significant life events recently.  Patient will monitor his stress level.      Health Maintenance: Patient  has already reached out to GI regarding colonoscopy later this year.  He notes that we will contact him when it is time to get this scheduled.  Return in about 6 months (around 11/26/2023).   Marikay Alar, MD [place] - [employer]

## 2023-05-27 NOTE — Assessment & Plan Note (Signed)
Patient with numerous significant life events recently.  Patient will monitor his stress level.

## 2023-05-27 NOTE — Patient Instructions (Signed)
Nice to see you. Please let us know if your urologist is no longer practicing. Please try to keep an eye on your blood pressure. Please let me know when you need a refill of the gabapentin.

## 2023-05-27 NOTE — Assessment & Plan Note (Signed)
Chronic issue.  Patient has seen orthopedics.  He will monitor.

## 2023-05-27 NOTE — Assessment & Plan Note (Signed)
Most recent PSA from 2022 was in the acceptable range.  He reports he had been following with urology for yearly screenings.  I encouraged him to contact his urologist.  I discussed that I thought that his urologist may have retired or left the area and if that was the case he should let us know and we can refer him to somebody else.

## 2023-05-30 ENCOUNTER — Ambulatory Visit (INDEPENDENT_AMBULATORY_CARE_PROVIDER_SITE_OTHER): Payer: Medicare Other | Admitting: Dermatology

## 2023-05-30 ENCOUNTER — Telehealth: Payer: Self-pay

## 2023-05-30 VITALS — BP 120/74

## 2023-05-30 DIAGNOSIS — R238 Other skin changes: Secondary | ICD-10-CM | POA: Diagnosis not present

## 2023-05-30 DIAGNOSIS — D225 Melanocytic nevi of trunk: Secondary | ICD-10-CM

## 2023-05-30 DIAGNOSIS — X32XXXA Exposure to sunlight, initial encounter: Secondary | ICD-10-CM

## 2023-05-30 DIAGNOSIS — L814 Other melanin hyperpigmentation: Secondary | ICD-10-CM | POA: Diagnosis not present

## 2023-05-30 DIAGNOSIS — W908XXA Exposure to other nonionizing radiation, initial encounter: Secondary | ICD-10-CM | POA: Diagnosis not present

## 2023-05-30 DIAGNOSIS — L570 Actinic keratosis: Secondary | ICD-10-CM

## 2023-05-30 DIAGNOSIS — D229 Melanocytic nevi, unspecified: Secondary | ICD-10-CM

## 2023-05-30 DIAGNOSIS — Z1283 Encounter for screening for malignant neoplasm of skin: Secondary | ICD-10-CM | POA: Diagnosis not present

## 2023-05-30 DIAGNOSIS — D485 Neoplasm of uncertain behavior of skin: Secondary | ICD-10-CM

## 2023-05-30 DIAGNOSIS — L578 Other skin changes due to chronic exposure to nonionizing radiation: Secondary | ICD-10-CM

## 2023-05-30 DIAGNOSIS — D1801 Hemangioma of skin and subcutaneous tissue: Secondary | ICD-10-CM | POA: Diagnosis not present

## 2023-05-30 DIAGNOSIS — L821 Other seborrheic keratosis: Secondary | ICD-10-CM

## 2023-05-30 DIAGNOSIS — Z86018 Personal history of other benign neoplasm: Secondary | ICD-10-CM

## 2023-05-30 NOTE — Telephone Encounter (Signed)
Message left for patient to return my call.  

## 2023-05-30 NOTE — Patient Instructions (Addendum)
Patient Handout: Wound Care for Skin Biopsy Site  Patient Handout: Wound Care for Skin Biopsy Site  Taking Care of Your Skin Biopsy Site  Proper care of the biopsy site is essential for promoting healing and minimizing scarring. This handout provides instructions on how to care for your biopsy site to ensure optimal recovery.  1. Cleaning the Wound:  Clean the biopsy site daily with gentle soap and water. Gently pat the area dry with a clean, soft towel. Avoid harsh scrubbing or rubbing the area, as this can irritate the skin and delay healing.  2. Applying Aquaphor and Bandage:  After cleaning the wound, apply a thin layer of Aquaphor ointment to the biopsy site. Cover the area with a sterile bandage to protect it from dirt, bacteria, and friction. Change the bandage daily or as needed if it becomes soiled or wet.  3. Continued Care for One Week:  Repeat the cleaning, Aquaphor application, and bandaging process daily for one week following the biopsy procedure. Keeping the wound clean and moist during this initial healing period will help prevent infection and promote optimal healing.  4. Massaging Aquaphor into the Area:  ---After one week, discontinue the use of bandages but continue to apply Aquaphor to the biopsy site. ----Gently massage the Aquaphor into the area using circular motions. ---Massaging the skin helps to promote circulation and prevent the formation of scar tissue.   Additional Tips:  Avoid exposing the biopsy site to direct sunlight during the healing process, as this can cause hyperpigmentation or worsen scarring. If you experience any signs of infection, such as increased redness, swelling, warmth, or drainage from the wound, contact your healthcare provider immediately. Follow any additional instructions provided by your healthcare provider for caring for the biopsy site and managing any discomfort. Conclusion:  Taking proper care of your skin biopsy site  is crucial for ensuring optimal healing and minimizing scarring. By following these instructions for cleaning, applying Aquaphor, and massaging the area, you can promote a smooth and successful recovery. If you have any questions or concerns about caring for your biopsy site, don't hesitate to contact your healthcare provider for guidance.   Due to recent changes in healthcare laws, you may see results of your pathology and/or laboratory studies on MyChart before the doctors have had a chance to review them. We understand that in some cases there may be results that are confusing or concerning to you. Please understand that not all results are received at the same time and often the doctors may need to interpret multiple results in order to provide you with the best plan of care or course of treatment. Therefore, we ask that you please give [place] 2 business days to thoroughly review all your results before contacting the office for clarification. Should we see a critical lab result, you will be contacted sooner.   If You Need Anything After Your Visit  If you have any questions or concerns for your doctor, please call our main line at [phone] and press option 4 to reach your doctor's medical assistant. If no one answers, please leave a voicemail as directed and we will return your call as soon as possible. Messages left after 4 pm will be answered the following business day.   You may also send [place] a message via MyChart. We typically respond to MyChart messages within 1-2 business days.  For prescription refills, please ask your pharmacy to contact our office. Our fax number is [phone].  If you have  an urgent issue when the clinic is closed that cannot wait until the next business day, you can page your doctor at the number below.    Please note that while we do our best to be available for urgent issues outside of office hours, we are not available 24/7.   If you have an urgent issue and are  unable to reach [place], you may choose to seek medical care at your doctor's office, retail clinic, urgent care center, or emergency room.  If you have a medical emergency, please immediately call 911 or go to the emergency department.  Pager Numbers  - Dr. Gwen Pounds: [phone]  - Dr. Neale Burly: [phone]  - Dr. Roseanne Reno: [phone]  In the event of inclement weather, please call our main line at [phone] for an update on the status of any delays or closures.  Dermatology Medication Tips: Please keep the boxes that topical medications come in in order to help keep track of the instructions about where and how to use these. Pharmacies typically print the medication instructions only on the boxes and not directly on the medication tubes.   If your medication is too expensive, please contact our office at [phone] option 4 or send [place] a message through MyChart.   We are unable to tell what your co-pay for medications will be in advance as this is different depending on your insurance coverage. However, we may be able to find a substitute medication at lower cost or fill out paperwork to get insurance to cover a needed medication.   If a prior authorization is required to get your medication covered by your insurance company, please allow [place] 1-2 business days to complete this process.  Drug prices often vary depending on where the prescription is filled and some pharmacies may offer cheaper prices.  The website www.goodrx.com contains coupons for medications through different pharmacies. The prices here do not account for what the cost may be with help from insurance (it may be cheaper with your insurance), but the website can give you the price if you did not use any insurance.  - You can print the associated coupon and take it with your prescription to the pharmacy.  - You may also stop by our office during regular business hours and pick up a GoodRx coupon card.  - If you need your  prescription sent electronically to a different pharmacy, notify our office through [facility] or by phone at [phone] option 4.

## 2023-05-30 NOTE — Progress Notes (Signed)
Follow-Up Visit   Subjective  Ronald Hunter is a 72 y.o. male who presents for the following: Skin Cancer Screening and Upper Body Skin Exam The patient presents for Upper Body Skin Exam (UBSE) for skin cancer screening and mole check. The patient has spots, moles and lesions to be evaluated, some may be new or changing and the patient has concerns that these could be cancer.  Hx of dysplastic nevi.  The following portions of the chart were reviewed this encounter and updated as appropriate: medications, allergies, medical history  Review of Systems:  No other skin or systemic complaints except as noted in HPI or Assessment and Plan.  Objective  Well appearing patient in no apparent distress; mood and affect are within normal limits.  All skin waist up examined. Relevant physical exam findings are noted in the Assessment and Plan.  forehead x 1 Erythematous thin papules/macules with gritty scale.   L low back 7 cm to the spine Irregular brown macule 0.8 cm        L mid back infrascapular Irregular brown macule 1.1 cm       Assessment & Plan   Venous lake Right Ear Antihelix Benign-appearing.  Observation.  Call clinic for new or changing lesions.  Recommend daily use of broad spectrum spf 30+ sunscreen to sun-exposed areas.   AK (actinic keratosis) forehead x 1  Actinic keratoses are precancerous spots that appear secondary to cumulative UV radiation exposure/sun exposure over time. They are chronic with expected duration over 1 year. A portion of actinic keratoses will progress to squamous cell carcinoma of the skin. It is not possible to reliably predict which spots will progress to skin cancer and so treatment is recommended to prevent development of skin cancer.  Recommend daily broad spectrum sunscreen SPF 30+ to sun-exposed areas, reapply every 2 hours as needed.  Recommend staying in the shade or wearing long sleeves, sun glasses (UVA+UVB protection) and wide  brim hats (4-inch brim around the entire circumference of the hat). Call for new or changing lesions.   Destruction of lesion - forehead x 1 Complexity: simple   Destruction method: cryotherapy   Informed consent: discussed and consent obtained   Timeout:  patient name, date of birth, surgical site, and procedure verified Lesion destroyed using liquid nitrogen: Yes   Region frozen until ice ball extended beyond lesion: Yes   Outcome: patient tolerated procedure well with no complications   Post-procedure details: wound care instructions given    Neoplasm of uncertain behavior of skin (2) L low back 7 cm to the spine  Epidermal / dermal shaving  Lesion diameter (cm):  1.1 Informed consent: discussed and consent obtained   Timeout: patient name, date of birth, surgical site, and procedure verified   Procedure prep:  Patient was prepped and draped in usual sterile fashion Prep type:  Isopropyl alcohol Anesthesia: the lesion was anesthetized in a standard fashion   Anesthetic:  1% lidocaine w/ epinephrine 1-100,000 buffered w/ 8.4% NaHCO3 Instrument used: flexible razor blade   Hemostasis achieved with: pressure, aluminum chloride and electrodesiccation   Outcome: patient tolerated procedure well   Post-procedure details: sterile dressing applied and wound care instructions given   Dressing type: bandage and petrolatum    Specimen 1 - Surgical pathology Differential Diagnosis: r/o Dysplastic Nevus  Check Margins: No Irregular brown macule 0.8 cm L low back 7 cm to the spine  L mid back infrascapular  Epidermal / dermal shaving  Lesion diameter (cm):  1.1 Informed consent: discussed and consent obtained   Timeout: patient name, date of birth, surgical site, and procedure verified   Procedure prep:  Patient was prepped and draped in usual sterile fashion Prep type:  Isopropyl alcohol Anesthesia: the lesion was anesthetized in a standard fashion   Anesthetic:  1% lidocaine  w/ epinephrine 1-100,000 buffered w/ 8.4% NaHCO3 Instrument used: flexible razor blade   Hemostasis achieved with: pressure, aluminum chloride and electrodesiccation   Outcome: patient tolerated procedure well   Post-procedure details: sterile dressing applied and wound care instructions given   Dressing type: bandage and petrolatum    Specimen 2 - Surgical pathology Differential Diagnosis: r/o Dysplastic Nevus  Check Margins: No Irregular brown macule 1.1 cm  Lentigines, Seborrheic Keratoses, Hemangiomas - Benign normal skin lesions - Benign-appearing - Call for any changes  Melanocytic Nevi - Tan-brown and/or pink-flesh-colored symmetric macules and papules - Benign appearing on exam today - Observation - Call clinic for new or changing moles - Recommend daily use of broad spectrum spf 30+ sunscreen to sun-exposed areas.   Actinic Damage - Chronic condition, secondary to cumulative UV/sun exposure - diffuse scaly erythematous macules with underlying dyspigmentation - Recommend daily broad spectrum sunscreen SPF 30+ to sun-exposed areas, reapply every 2 hours as needed.  - Staying in the shade or wearing long sleeves, sun glasses (UVA+UVB protection) and wide brim hats (4-inch brim around the entire circumference of the hat) are also recommended for sun protection.  - Call for new or changing lesions.  Skin cancer screening performed today.  History of Dysplastic Nevi - No evidence of recurrence today - Recommend regular full body skin exams - Recommend daily broad spectrum sunscreen SPF 30+ to sun-exposed areas, reapply every 2 hours as needed.  - Call if any new or changing lesions are noted between office visits  Return in about 6 months (around 11/29/2023) for TBSE, Hx Dysplastic Nevi.  Anise Salvo, RMA, am acting as scribe for Armida Sans, MD .  Documentation: I have reviewed the above documentation for accuracy and completeness, and I agree with the  above.  Armida Sans, MD

## 2023-05-30 NOTE — Telephone Encounter (Signed)
Pt received letter to schedule colonoscopy please return call 

## 2023-06-01 ENCOUNTER — Encounter: Payer: Self-pay | Admitting: Dermatology

## 2023-06-03 ENCOUNTER — Other Ambulatory Visit: Payer: Self-pay | Admitting: *Deleted

## 2023-06-03 ENCOUNTER — Telehealth: Payer: Self-pay | Admitting: *Deleted

## 2023-06-03 DIAGNOSIS — Z86010 Personal history of colonic polyps: Secondary | ICD-10-CM

## 2023-06-03 DIAGNOSIS — Z800 Family history of malignant neoplasm of digestive organs: Secondary | ICD-10-CM

## 2023-06-03 MED ORDER — NA SULFATE-K SULFATE-MG SULF 17.5-3.13-1.6 GM/177ML PO SOLN: 1.0000 | Freq: Once | ORAL | 0 refills | Status: AC

## 2023-06-03 NOTE — Telephone Encounter (Signed)
Colonoscopy schedule on 09/10/2023 with Dr Servando Snare at [facility]

## 2023-06-03 NOTE — Telephone Encounter (Signed)
Gastroenterology Pre-Procedure Review  Request Date: 09/10/2023 Requesting Physician: Dr. Servando Snare  PATIENT REVIEW QUESTIONS: The patient responded to the following health history questions as indicated:    1. Are you having any GI issues? no 2. Do you have a personal history of Polyps? yes (08/26/2018) 3. Do you have a family history of Colon Cancer or Polyps? yes (father had colon cancer) 4. Diabetes Mellitus? no 5. Joint replacements in the past 12 months?no 6. Major health problems in the past 3 months?no 7. Any artificial heart valves, MVP, or defibrillator?no    MEDICATIONS & ALLERGIES:    Patient reports the following regarding taking any anticoagulation/antiplatelet therapy:   Plavix, Coumadin, Eliquis, Xarelto, Lovenox, Pradaxa, Brilinta, or Effient? no Aspirin? yes (81 mg)  Patient confirms/reports the following medications:  Current Outpatient Medications  Medication Sig Dispense Refill   Na Sulfate-K Sulfate-Mg Sulf 17.5-3.13-1.6 GM/177ML SOLN Take 1 kit by mouth once for 1 dose. 354 mL 0   aspirin 81 MG tablet Take 81 mg by mouth daily.     gabapentin (NEURONTIN) 100 MG capsule Take 1 capsule (100 mg total) by mouth at bedtime as needed. 90 capsule 1   hydrocortisone 2.5 % cream Apply topically 3 (three) times a week. Apply to scaly areas on face 3 nights weekly, Tuesday, Thursday and Saturdays 30 g 6   lisinopril (ZESTRIL) 40 MG tablet Take 1 tablet (40 mg total) by mouth daily. 90 tablet 3   Multiple Vitamin (MULTIVITAMIN WITH MINERALS) TABS Take 1 tablet by mouth daily.     rosuvastatin (CRESTOR) 40 MG tablet Take 1 tablet (40 mg total) by mouth daily. 90 tablet 3   No current facility-administered medications for this visit.    Patient confirms/reports the following allergies:  No Known Allergies  No orders of the defined types were placed in this encounter.   AUTHORIZATION INFORMATION Primary Insurance: 1D#: Group #:  Secondary Insurance: 1D#: Group  #:  SCHEDULE INFORMATION: Date: 09/10/2023 Time: Location:  [facility]

## 2023-06-05 ENCOUNTER — Telehealth: Payer: Self-pay

## 2023-06-05 NOTE — Telephone Encounter (Signed)
Patient informed of pathology results 

## 2023-06-05 NOTE — Telephone Encounter (Signed)
-----   Message from Deirdre Evener, MD sent at 06/04/2023  5:35 PM EDT ----- Diagnosis 1. Skin , left low back 7 cm to the spine DYSPLASTIC COMPOUND NEVUS WITH MODERATE ATYPIA, PERIPHERAL MARGIN INVOLVED 2. Skin , left mid back infrascapular DYSPLASTIC COMPOUND NEVUS WITH MODERATE ATYPIA, CLOSE TO MARGIN  1&2 - both Moderate dysplastic Recheck next visit

## 2023-07-25 ENCOUNTER — Ambulatory Visit (INDEPENDENT_AMBULATORY_CARE_PROVIDER_SITE_OTHER): Payer: Medicare Other | Admitting: Emergency Medicine

## 2023-07-25 VITALS — Ht 72.0 in | Wt 230.0 lb

## 2023-07-25 DIAGNOSIS — Z0000 Encounter for general adult medical examination without abnormal findings: Secondary | ICD-10-CM | POA: Diagnosis not present

## 2023-07-25 NOTE — Patient Instructions (Addendum)
Ronald Hunter , Thank you for taking time to come for your Medicare Wellness Visit. I appreciate your ongoing commitment to your health goals. Please review the following plan we discussed and let me know if I can assist you in the future.   Referrals/Orders/Follow-Ups/Clinician Recommendations: Get your flu shot this fall. Consider getting covid booster when available and Shingles vaccine.  This is a list of the screening recommended for you and due dates:  Health Maintenance  Topic Date Due   Flu Shot  07/18/2023   Colon Cancer Screening  08/27/2023   COVID-19 Vaccine (6 - 2023-24 season) 08/10/2023*   Zoster (Shingles) Vaccine (1 of 2) 10/25/2023*   Medicare Annual Wellness Visit  07/24/2024   DTaP/Tdap/Td vaccine (2 - Td or Tdap) 04/24/2028   Pneumonia Vaccine  Completed   Hepatitis C Screening  Completed   HPV Vaccine  Aged Out  *Topic was postponed. The date shown is not the original due date.    Advanced directives: (Copy Requested) Please bring a copy of your health care power of attorney and living will to the office to be added to your chart at your convenience.  Next Medicare Annual Wellness Visit scheduled for next year: Yes, 07/30/24 @ 10:30am  Preventive Care 65 Years and Older, Male  Preventive care refers to lifestyle choices and visits with your health care provider that can promote health and wellness. What does preventive care include? A yearly physical exam. This is also called an annual well check. Dental exams once or twice a year. Routine eye exams. Ask your health care provider how often you should have your eyes checked. Personal lifestyle choices, including: Daily care of your teeth and gums. Regular physical activity. Eating a healthy diet. Avoiding tobacco and drug use. Limiting alcohol use. Practicing safe sex. Taking low doses of aspirin every day. Taking vitamin and mineral supplements as recommended by your health care provider. What happens during an  annual well check? The services and screenings done by your health care provider during your annual well check will depend on your age, overall health, lifestyle risk factors, and family history of disease. Counseling  Your health care provider may ask you questions about your: Alcohol use. Tobacco use. Drug use. Emotional well-being. Home and relationship well-being. Sexual activity. Eating habits. History of falls. Memory and ability to understand (cognition). Work and work Astronomer. Screening  You may have the following tests or measurements: Height, weight, and BMI. Blood pressure. Lipid and cholesterol levels. These may be checked every 5 years, or more frequently if you are over 89 years old. Skin check. Lung cancer screening. You may have this screening every year starting at age 64 if you have a 30-pack-year history of smoking and currently smoke or have quit within the past 15 years. Fecal occult blood test (FOBT) of the stool. You may have this test every year starting at age 70. Flexible sigmoidoscopy or colonoscopy. You may have a sigmoidoscopy every 5 years or a colonoscopy every 10 years starting at age 2. Prostate cancer screening. Recommendations will vary depending on your family history and other risks. Hepatitis C blood test. Hepatitis B blood test. Sexually transmitted disease (STD) testing. Diabetes screening. This is done by checking your blood sugar (glucose) after you have not eaten for a while (fasting). You may have this done every 1-3 years. Abdominal aortic aneurysm (AAA) screening. You may need this if you are a current or former smoker. Osteoporosis. You may be screened starting at age  70 if you are at high risk. Talk with your health care provider about your test results, treatment options, and if necessary, the need for more tests. Vaccines  Your health care provider may recommend certain vaccines, such as: Influenza vaccine. This is recommended  every year. Tetanus, diphtheria, and acellular pertussis (Tdap, Td) vaccine. You may need a Td booster every 10 years. Zoster vaccine. You may need this after age 40. Pneumococcal 13-valent conjugate (PCV13) vaccine. One dose is recommended after age 25. Pneumococcal polysaccharide (PPSV23) vaccine. One dose is recommended after age 71. Talk to your health care provider about which screenings and vaccines you need and how often you need them. This information is not intended to replace advice given to you by your health care provider. Make sure you discuss any questions you have with your health care provider. Document Released: 12/30/2015 Document Revised: 08/22/2016 Document Reviewed: 10/04/2015 Elsevier Interactive Patient Education  2017 [employer].  Fall Prevention in the Home Falls can cause injuries. They can happen to people of all ages. There are many things you can do to make your home safe and to help prevent falls. What can I do on the outside of my home? Regularly fix the edges of walkways and driveways and fix any cracks. Remove anything that might make you trip as you walk through a door, such as a raised step or threshold. Trim any bushes or trees on the path to your home. Use bright outdoor lighting. Clear any walking paths of anything that might make someone trip, such as rocks or tools. Regularly check to see if handrails are loose or broken. Make sure that both sides of any steps have handrails. Any raised decks and porches should have guardrails on the edges. Have any leaves, snow, or ice cleared regularly. Use sand or salt on walking paths during winter. Clean up any spills in your garage right away. This includes oil or grease spills. What can I do in the bathroom? Use night lights. Install grab bars by the toilet and in the tub and shower. Do not use towel bars as grab bars. Use non-skid mats or decals in the tub or shower. If you need to sit down in the shower,  use a plastic, non-slip stool. Keep the floor dry. Clean up any water that spills on the floor as soon as it happens. Remove soap buildup in the tub or shower regularly. Attach bath mats securely with double-sided non-slip rug tape. Do not have throw rugs and other things on the floor that can make you trip. What can I do in the bedroom? Use night lights. Make sure that you have a light by your bed that is easy to reach. Do not use any sheets or blankets that are too big for your bed. They should not hang down onto the floor. Have a firm chair that has side arms. You can use this for support while you get dressed. Do not have throw rugs and other things on the floor that can make you trip. What can I do in the kitchen? Clean up any spills right away. Avoid walking on wet floors. Keep items that you use a lot in easy-to-reach places. If you need to reach something above you, use a strong step stool that has a grab bar. Keep electrical cords out of the way. Do not use floor polish or wax that makes floors slippery. If you must use wax, use non-skid floor wax. Do not have throw rugs and other  things on the floor that can make you trip. What can I do with my stairs? Do not leave any items on the stairs. Make sure that there are handrails on both sides of the stairs and use them. Fix handrails that are broken or loose. Make sure that handrails are as long as the stairways. Check any carpeting to make sure that it is firmly attached to the stairs. Fix any carpet that is loose or worn. Avoid having throw rugs at the top or bottom of the stairs. If you do have throw rugs, attach them to the floor with carpet tape. Make sure that you have a light switch at the top of the stairs and the bottom of the stairs. If you do not have them, ask someone to add them for you. What else can I do to help prevent falls? Wear shoes that: Do not have high heels. Have rubber bottoms. Are comfortable and fit you  well. Are closed at the toe. Do not wear sandals. If you use a stepladder: Make sure that it is fully opened. Do not climb a closed stepladder. Make sure that both sides of the stepladder are locked into place. Ask someone to hold it for you, if possible. Clearly mark and make sure that you can see: Any grab bars or handrails. First and last steps. Where the edge of each step is. Use tools that help you move around (mobility aids) if they are needed. These include: Canes. Walkers. Scooters. Crutches. Turn on the lights when you go into a dark area. Replace any light bulbs as soon as they burn out. Set up your furniture so you have a clear path. Avoid moving your furniture around. If any of your floors are uneven, fix them. If there are any pets around you, be aware of where they are. Review your medicines with your doctor. Some medicines can make you feel dizzy. This can increase your chance of falling. Ask your doctor what other things that you can do to help prevent falls. This information is not intended to replace advice given to you by your health care provider. Make sure you discuss any questions you have with your health care provider. Document Released: 09/29/2009 Document Revised: 05/10/2016 Document Reviewed: 01/07/2015 Elsevier Interactive Patient Education  2017 [employer].

## 2023-07-25 NOTE — Progress Notes (Signed)
Subjective:   Ronald CURREN is a 72 y.o. male who presents for Medicare Annual/Subsequent preventive examination.  Visit Complete: Virtual  I connected with  Marlana Salvage on 07/25/23 by a audio enabled telemedicine application and verified that I am speaking with the correct person using two identifiers.  Patient Location: Home  Provider Location: Home Office  I discussed the limitations of evaluation and management by telemedicine. The patient expressed understanding and agreed to proceed.  Patient Medicare AWV questionnaire was completed by the patient on 07/21/23; I have confirmed that all information answered by patient is correct and no changes since this date.  Vital Signs: Unable to obtain new vitals due to this being a telehealth visit.   Review of Systems     Cardiac Risk Factors include: advanced age (>79men, >15 women);male gender;dyslipidemia;hypertension;obesity (BMI >30kg/m2)     Objective:    Today's Vitals   07/21/23 1110 07/25/23 1036  Weight:  230 lb (104.3 kg)  Height:  6' (1.829 m)  PainSc: 6     Body mass index is 31.19 kg/m.     07/25/2023   10:51 AM 07/12/2022    9:17 AM 07/11/2021    8:50 AM 04/13/2021    3:04 AM 04/12/2021    6:06 PM 07/08/2020    8:46 AM 07/08/2019    8:31 AM  Advanced Directives  Does Patient Have a Medical Advance Directive? Yes Yes Yes No No No No  Type of Estate agent of [place];Living will Healthcare Power of [place];Living will Healthcare Power of [place];Living will      Does patient want to make changes to medical advance directive?  No - Patient declined No - Patient declined      Copy of Healthcare Power of Attorney in Chart? No - copy requested No - copy requested No - copy requested      Would patient like information on creating a medical advance directive?    No - Patient declined  No - Patient declined No - Patient declined    Current Medications (verified) Outpatient Encounter Medications as  of 07/25/2023  Medication Sig   aspirin 81 MG tablet Take 81 mg by mouth daily.   gabapentin (NEURONTIN) 100 MG capsule Take 1 capsule (100 mg total) by mouth at bedtime as needed.   hydrocortisone 2.5 % cream Apply topically 3 (three) times a week. Apply to scaly areas on face 3 nights weekly, Tuesday, Thursday and Saturdays   lisinopril (ZESTRIL) 40 MG tablet Take 1 tablet (40 mg total) by mouth daily.   Multiple Vitamin (MULTIVITAMIN WITH MINERALS) TABS Take 1 tablet by mouth daily.   rosuvastatin (CRESTOR) 40 MG tablet Take 1 tablet (40 mg total) by mouth daily.   No facility-administered encounter medications on file as of 07/25/2023.    Allergies (verified) Patient has no known allergies.   History: Past Medical History:  Diagnosis Date   Anxiety    Arthritis    Chronic back pain    stenosis/spondylosis   Dysplastic nevus 11/15/2021   L mid to low back paraspinal, 2.0 cm lat to spine, Severe, Excised 01/16/22   Dysplastic nevus 11/15/2021   L mid to low back lateral, 11.0cm lat to spinal, moderate atypia   Dysplastic nevus 01/23/2022   R infrascapular - mod to severe, Excised 03/13/22   Dysplastic nevus 01/23/2022   L mid back 7.0 cm lat to spine and inf to L mid back 6.0 cm lat to spine - severe, Excised  01/30/22   Dysplastic nevus 01/23/2022   L mid back 6.0 cm lat to spine - moderate   Dysplastic nevus 02/17/2022   mid back spinal sup - mod   Dysplastic nevus 02/17/2022   mid back spinal inf - mod   Dysplastic nevus 02/17/2022   right mid back 12.5 cm lat to spine inf - mod to severe   Dysplastic nevus 02/17/2022   right mid back 12.5 cm lat to spine sup - severe - Shave removal 04/10/2022   Dysplastic nevus 11/21/2022   right lat back near side - moderate   Dysplastic nevus 05/30/2023   left low back 7 cm to the spine - moderate   Dysplastic nevus 05/30/2023   L mid back infra scapular - moderate   History of bronchitis    as a child   History of colon polyps     History of kidney stones    Hyperlipidemia    takes Crestor daily   Hypertension    takes Metoprolol daily   PONV (postoperative nausea and vomiting)    Nausea with one surgery   Past Surgical History:  Procedure Laterality Date   COLONOSCOPY     COLONOSCOPY     several   COLONOSCOPY W/ POLYPECTOMY     COLONOSCOPY WITH PROPOFOL N/A 08/26/2018   Procedure: COLONOSCOPY WITH PROPOFOL;  Surgeon: Midge Minium, MD;  Location: [facility]COPY;  Service: Endoscopy;  Laterality: N/A;   cyst removed from under left arm     KNEE ARTHROSCOPY     right    KNEE ARTHROSCOPY W/ ACL RECONSTRUCTION     LUMBAR LAMINECTOMY/DECOMPRESSION MICRODISCECTOMY  08/11/2012   Procedure: LUMBAR LAMINECTOMY/DECOMPRESSION MICRODISCECTOMY 1 LEVEL;  Surgeon: Hewitt Shorts, MD;  Location: [facility] NEURO ORS;  Service: Neurosurgery;  Laterality: Right;  RIGHT L45 laminotomy and microdiskectomy   LUMBAR LAMINECTOMY/DECOMPRESSION MICRODISCECTOMY Right 01/29/2015   Procedure: LUMBAR LAMINECTOMY/DECOMPRESSION MICRODISCECTOMY 1 LEVEL;  Surgeon: Hewitt Shorts, MD;  Location: [facility] NEURO OR[facility];  Service: Neurosurgery;  Laterality: Right;  Right L45 Laminotomy and microdisketomy   SHOULDER SURGERY     right    TONSILLECTOMY     Family History  Problem Relation Age of Onset   Hyperlipidemia Mother    Heart disease Mother    Congestive Heart Failure Mother    Colon cancer Father    Social History   Socioeconomic History   Marital status: Married    Spouse name: Lurena Joiner   Number of children: 1   Years of education: Not on file   Highest education level: Associate degree: occupational, Scientist, product/process development, or vocational program  Occupational History    Comment: retired   Occupation: retired Research officer, political party  Tobacco Use   Smoking status: Never   Smokeless tobacco: Never  Vaping Use   Vaping status: Never Used  Substance and Sexual Activity   Alcohol use: Yes    Alcohol/week: 15.0 standard drinks of alcohol    Types: 14 Glasses of  wine, 1 Standard drinks or equivalent per week    Comment: 2 glasses of wine with dinner, 1 martini every Friday night   Drug use: No   Sexual activity: Yes  Other Topics Concern   Not on file  Social History Narrative   Married to [place], has 1 son that lives in [place]   Social Determinants of Health   Financial Resource Strain: Low Risk  (07/21/2023)   Overall Financial Resource Strain (CARDIA)    Difficulty of Paying Living Expenses: Not hard at all  Food Insecurity: No Food Insecurity (07/21/2023)   Hunger Vital Sign    Worried About Running Out of Food in the Last Year: Never true    Ran Out of Food in the Last Year: Never true  Transportation Needs: No Transportation Needs (07/21/2023)   PRAPARE - Administrator, Civil Service (Medical): No    Lack of Transportation (Non-Medical): No  Physical Activity: Sufficiently Active (07/21/2023)   Exercise Vital Sign    Days of Exercise per Week: 6 days    Minutes of Exercise per Session: 50 min  Stress: No Stress Concern Present (07/21/2023)   [employer] of Occupational Health - Occupational Stress Questionnaire    Feeling of Stress : Only a little  Social Connections: Socially Isolated (07/21/2023)   Social Connection and Isolation Panel [NHANES]    Frequency of Communication with Friends and Family: Never    Frequency of Social Gatherings with Friends and Family: Never    Attends Religious Services: Never    Diplomatic Services operational officer: No    Attends Engineer, structural: Never    Marital Status: Married    Tobacco Counseling Counseling given: Not Answered   Clinical Intake:  Pre-visit preparation completed: Yes  Pain : 0-10 Pain Score: 6  Pain Type: Chronic pain Pain Location: Back Pain Descriptors / Indicators: Aching     BMI - recorded: 31.19 Nutritional Status: BMI > 30  Obese Nutritional Risks: Nausea/ vomitting/ diarrhea Diabetes: No  How often do you need to have  someone help you when you read instructions, pamphlets, or other written materials from your doctor or pharmacy?: 1 - Never  Interpreter Needed?: No  Information entered by :: Tora Kindred, CMA   Activities of Daily Living    07/21/2023   11:10 AM  In your present state of health, do you have any difficulty performing the following activities:  Hearing? 0  Vision? 0  Difficulty concentrating or making decisions? 0  Walking or climbing stairs? 0  Dressing or bathing? 0  Doing errands, shopping? 0  Preparing Food and eating ? N  Using the Toilet? N  In the past six months, have you accidently leaked urine? N  Do you have problems with loss of bowel control? N  Managing your Medications? N  Managing your Finances? N  Housekeeping or managing your Housekeeping? N    Patient Care Team: Glori Luis, MD as PCP - General (Family Medicine)  Indicate any recent Medical Services you may have received from other than [facility] providers in the past year (date may be approximate).     Assessment:   This is a routine wellness examination for [place].  Hearing/Vision screen Hearing Screening - Comments:: Denies hearing loss  Dietary issues and exercise activities discussed:     Goals Addressed               This Visit's Progress     Patient Stated (pt-stated)        Maintain current health, stay active      Depression Screen    07/25/2023   10:49 AM 05/27/2023    8:10 AM 11/23/2022    9:08 AM 07/12/2022    9:15 AM 05/23/2022    9:32 AM 11/06/2021    8:11 AM 07/11/2021    8:49 AM  PHQ 2/9 Scores  PHQ - 2 Score 0 1 0 0 0 0 0  PHQ- 9 Score 0 1  Fall Risk    07/21/2023   11:10 AM 05/27/2023    8:09 AM 11/23/2022    9:08 AM 07/12/2022    9:17 AM 05/23/2022    9:32 AM  Fall Risk   Falls in the past year? 0 0 0 0 0  Number falls in past yr: 0 0 0 0 0  Injury with Fall? 0 0 0  0  Risk for fall due to : No Fall Risks No Fall Risks No Fall Risks  No Fall Risks  Follow up  Falls prevention discussed Falls evaluation completed Falls evaluation completed Falls evaluation completed Falls evaluation completed    MEDICARE RISK AT HOME:   TIMED UP AND GO:  Was the test performed?  No    Cognitive Function:    07/08/2020    8:49 AM  MMSE - Mini Mental State Exam  Not completed: Unable to complete        07/25/2023   10:52 AM  6CIT Screen  What Year? 0 points  What month? 0 points  What time? 0 points  Count back from 20 0 points  Months in reverse 0 points  Repeat phrase 0 points  Total Score 0 points    Immunizations Immunization History  Administered Date(s) Administered   Influenza, High Dose Seasonal PF 10/27/2018, 10/07/2019   Influenza,inj,Quad PF,6+ Mos 10/23/2017   Influenza-Unspecified 10/05/2020, 10/17/2021, 10/22/2022   PFIZER(Purple Top)SARS-COV-2 Vaccination 01/26/2020, 02/16/2020, 10/19/2020, 04/27/2021   Pfizer Covid-19 Vaccine Bivalent Booster 74yrs & up 09/29/2021   Pneumococcal Conjugate-13 04/24/2018   Pneumococcal Polysaccharide-23 07/14/2019   Tdap 04/24/2018    TDAP status: Up to date  Flu Vaccine status: Due, Education has been provided regarding the importance of this vaccine. Advised may receive this vaccine at local pharmacy or Health Dept. Aware to provide a copy of the vaccination record if obtained from local pharmacy or Health Dept. Verbalized acceptance and understanding.  Pneumococcal vaccine status: Up to date  Covid-19 vaccine status: Declined, Education has been provided regarding the importance of this vaccine but patient still declined. Advised may receive this vaccine at local pharmacy or Health Dept.or vaccine clinic. Aware to provide a copy of the vaccination record if obtained from local pharmacy or Health Dept. Verbalized acceptance and understanding.  Qualifies for Shingles Vaccine? Yes   Zostavax completed No   Shingrix Completed?: No.    Education has been provided regarding the importance of this  vaccine. Patient has been advised to call insurance company to determine out of pocket expense if they have not yet received this vaccine. Advised may also receive vaccine at local pharmacy or Health Dept. Verbalized acceptance and understanding.  Screening Tests Health Maintenance  Topic Date Due   Zoster Vaccines- Shingrix (1 of 2) Never done   COVID-19 Vaccine (6 - 2023-24 season) 08/17/2022   INFLUENZA VACCINE  07/18/2023   Colonoscopy  08/27/2023   Medicare Annual Wellness (AWV)  07/24/2024   DTaP/Tdap/Td (2 - Td or Tdap) 04/24/2028   Pneumonia Vaccine 39+ Years old  Completed   Hepatitis C Screening  Completed   HPV VACCINES  Aged Out    Health Maintenance  Health Maintenance Due  Topic Date Due   Zoster Vaccines- Shingrix (1 of 2) Never done   COVID-19 Vaccine (6 - 2023-24 season) 08/17/2022   INFLUENZA VACCINE  07/18/2023   Colonoscopy  08/27/2023    Colorectal cancer screening: Type of screening: Colonoscopy. Completed 08/26/18. Repeat every 5 years Scheduled 09/10/23  Lung Cancer Screening: (Low  Dose CT Chest recommended if Age 67-80 years, 20 pack-year currently smoking OR have quit w/in 15years.) does not qualify.   Lung Cancer Screening Referral: n/a  Additional Screening:  Hepatitis C Screening: does not qualify; Completed 06/01/19  Vision Screening: Recommended annual ophthalmology exams for early detection of glaucoma and other disorders of the eye.  IDental Screening: Recommended annual dental exams for proper oral hygiene  Community Resource Referral / Chronic Care Management: CRR required this visit?  No   CCM required this visit?  No     Plan:     I have personally reviewed and noted the following in the patient's chart:   Medical and social history Use of alcohol, tobacco or illicit drugs  Current medications and supplements including opioid prescriptions. Patient is not currently taking opioid prescriptions. Functional ability and  status Nutritional status Physical activity Advanced directives List of other physicians Hospitalizations, surgeries, and ER visits in previous 12 months Vitals Screenings to include cognitive, depression, and falls Referrals and appointments  In addition, I have reviewed and discussed with patient certain preventive protocols, quality metrics, and best practice recommendations. A written personalized care plan for preventive services as well as general preventive health recommendations were provided to patient.     Tora Kindred, CMA   07/25/2023   After Visit Summary: (MyChart) Due to this being a telephonic visit, the after visit summary with patients personalized plan was offered to patient via MyChart   Nurse Notes:  Will get flu shot in November 2024. Declined Shingrix and covid booster.

## 2023-08-22 ENCOUNTER — Other Ambulatory Visit: Payer: Self-pay | Admitting: Family Medicine

## 2023-08-22 DIAGNOSIS — M545 Low back pain, unspecified: Secondary | ICD-10-CM

## 2023-09-03 ENCOUNTER — Telehealth: Payer: Self-pay | Admitting: *Deleted

## 2023-09-03 ENCOUNTER — Encounter: Payer: Self-pay | Admitting: Gastroenterology

## 2023-09-03 NOTE — Telephone Encounter (Signed)
Patient called office stating that he usually ending up not doing the part 2 of the prep solution.  Patient stated he plan on fasting starting on Thursday, 09/05/2023 so he should be clear just doing just one time on Monday evening at 5 pm. He asked if he has to do the second part if he is for sure clear that night. I told him it is to his discretion if he did that much. I told him, he does more than most would prepping for the colonoscopy.

## 2023-09-09 ENCOUNTER — Encounter: Payer: Self-pay | Admitting: Gastroenterology

## 2023-09-10 ENCOUNTER — Encounter
Admission: RE | Disposition: A | Discharge status: Home / Self Care | Payer: Self-pay | Source: Home / Self Care | Attending: Gastroenterology

## 2023-09-10 ENCOUNTER — Ambulatory Visit: Payer: Medicare Other | Admitting: Certified Registered"

## 2023-09-10 ENCOUNTER — Encounter: Payer: Self-pay | Admitting: Gastroenterology

## 2023-09-10 ENCOUNTER — Ambulatory Visit
Admission: RE | Admit: 2023-09-10 | Discharge: 2023-09-10 | Disposition: A | Discharge status: Home / Self Care | Payer: Medicare Other | Attending: Gastroenterology | Admitting: Gastroenterology

## 2023-09-10 DIAGNOSIS — Z1211 Encounter for screening for malignant neoplasm of colon: Secondary | ICD-10-CM | POA: Diagnosis not present

## 2023-09-10 DIAGNOSIS — Z860100 Personal history of colon polyps, unspecified: Secondary | ICD-10-CM

## 2023-09-10 DIAGNOSIS — I10 Essential (primary) hypertension: Secondary | ICD-10-CM | POA: Diagnosis not present

## 2023-09-10 DIAGNOSIS — Z800 Family history of malignant neoplasm of digestive organs: Secondary | ICD-10-CM | POA: Diagnosis not present

## 2023-09-10 DIAGNOSIS — D123 Benign neoplasm of transverse colon: Secondary | ICD-10-CM | POA: Insufficient documentation

## 2023-09-10 DIAGNOSIS — K635 Polyp of colon: Secondary | ICD-10-CM

## 2023-09-10 HISTORY — PX: POLYPECTOMY: SHX5525

## 2023-09-10 HISTORY — PX: COLONOSCOPY WITH PROPOFOL: SHX5780

## 2023-09-10 SURGERY — COLONOSCOPY WITH PROPOFOL
Anesthesia: General

## 2023-09-10 MED ORDER — PROPOFOL 10 MG/ML IV BOLUS
INTRAVENOUS | Status: AC
  Filled 2023-09-10: qty 40

## 2023-09-10 MED ORDER — PROPOFOL 10 MG/ML IV BOLUS
INTRAVENOUS | Status: DC | PRN
  Administered 2023-09-10: 120 mg via INTRAVENOUS
  Administered 2023-09-10: 110 ug/kg/min via INTRAVENOUS

## 2023-09-10 MED ORDER — SODIUM CHLORIDE 0.9 % IV SOLN: INTRAVENOUS | Status: DC

## 2023-09-10 MED ORDER — LIDOCAINE HCL (PF) 2 % IJ SOLN
INTRAMUSCULAR | Status: AC
  Filled 2023-09-10: qty 5

## 2023-09-10 MED ORDER — LIDOCAINE HCL (CARDIAC) PF 100 MG/5ML IV SOSY
PREFILLED_SYRINGE | INTRAVENOUS | Status: DC | PRN
  Administered 2023-09-10: 100 mg via INTRAVENOUS

## 2023-09-10 MED ORDER — PHENYLEPHRINE 80 MCG/ML (10ML) SYRINGE FOR IV PUSH (FOR BLOOD PRESSURE SUPPORT)
PREFILLED_SYRINGE | INTRAVENOUS | Status: DC | PRN
Start: 2023-09-10 — End: 2023-09-10
  Administered 2023-09-10 (×2): 80 ug via INTRAVENOUS

## 2023-09-10 NOTE — Transfer of Care (Signed)
Immediate Anesthesia Transfer of Care Note  Patient: Ronald Hunter  Procedure(s) Performed: COLONOSCOPY WITH PROPOFOL POLYPECTOMY  Patient Location: Endoscopy Unit  Anesthesia Type:General  Level of Consciousness: awake, alert , and oriented  Airway & Oxygen Therapy: Patient Spontanous Breathing  Post-op Assessment: Report given to RN and Post -op Vital signs reviewed and stable  Post vital signs: Reviewed and stable  Last Vitals:  Vitals Value Taken Time  BP 96/54 09/10/23 0822  Temp 36.8 C 09/10/23 0820  Pulse 66 09/10/23 0820  Resp 17 09/10/23 0820  SpO2 95 % 09/10/23 0820    Last Pain:  Vitals:   09/10/23 0820  TempSrc: Temporal  PainSc: Asleep         Complications: No notable events documented.

## 2023-09-10 NOTE — Anesthesia Preprocedure Evaluation (Signed)
Anesthesia Evaluation  Patient identified by MRN, date of birth, ID band Patient awake    Reviewed: Allergy & Precautions, NPO status , Patient's Chart, lab work & pertinent test results, reviewed documented beta blocker date and time   History of Anesthesia Complications (+) PONV and history of anesthetic complications  Airway Mallampati: III  TM Distance: >3 FB Neck ROM: Full    Dental  (+) Teeth Intact, Dental Advisory Given   Pulmonary neg pulmonary ROS   breath sounds clear to auscultation- rhonchi       Cardiovascular hypertension, Pt. on medications and Pt. on home beta blockers (-) angina (-) Past MI and (-) Cardiac Stents (-) dysrhythmias (-) Valvular Problems/Murmurs Rhythm:Regular Rate:Normal     Neuro/Psych  PSYCHIATRIC DISORDERS Anxiety     negative neurological ROS     GI/Hepatic negative GI ROS, Neg liver ROS,,,  Endo/Other  negative endocrine ROS    Renal/GU negative Renal ROS     Musculoskeletal  (+) Arthritis ,    Abdominal   Peds  Hematology negative hematology ROS (+)   Anesthesia Other Findings Past Medical History: No date: Anxiety No date: Arthritis No date: Chronic back pain     Comment:  stenosis/spondylosis 11/15/2021: Dysplastic nevus     Comment:  L mid to low back paraspinal, 2.0 cm lat to spine,               Severe, Excised 01/16/22 11/15/2021: Dysplastic nevus     Comment:  L mid to low back lateral, 11.0cm lat to spinal,               moderate atypia 01/23/2022: Dysplastic nevus     Comment:  R infrascapular - mod to severe, Excised 03/13/22 01/23/2022: Dysplastic nevus     Comment:  L mid back 7.0 cm lat to spine and inf to L mid back 6.0              cm lat to spine - severe, Excised 01/30/22 01/23/2022: Dysplastic nevus     Comment:  L mid back 6.0 cm lat to spine - moderate 02/17/2022: Dysplastic nevus     Comment:  mid back spinal sup - mod 02/17/2022: Dysplastic  nevus     Comment:  mid back spinal inf - mod 02/17/2022: Dysplastic nevus     Comment:  right mid back 12.5 cm lat to spine inf - mod to severe 02/17/2022: Dysplastic nevus     Comment:  right mid back 12.5 cm lat to spine sup - severe - Shave              removal 04/10/2022 11/21/2022: Dysplastic nevus     Comment:  right lat back near side - moderate 05/30/2023: Dysplastic nevus     Comment:  left low back 7 cm to the spine - moderate 05/30/2023: Dysplastic nevus     Comment:  L mid back infra scapular - moderate No date: History of bronchitis     Comment:  as a child No date: History of colon polyps No date: History of kidney stones No date: Hyperlipidemia     Comment:  takes Crestor daily No date: Hypertension     Comment:  takes Metoprolol daily No date: PONV (postoperative nausea and vomiting)     Comment:  Nausea with one surgery   Reproductive/Obstetrics negative OB ROS  Anesthesia Physical Anesthesia Plan  ASA: 2  Anesthesia Plan: General   Post-op Pain Management:    Induction: Intravenous  PONV Risk Score and Plan: 3 and Propofol infusion and TIVA  Airway Management Planned: Nasal Cannula and Natural Airway  Additional Equipment:   Intra-op Plan:   Post-operative Plan:   Informed Consent: I have reviewed the patients History and Physical, chart, labs and discussed the procedure including the risks, benefits and alternatives for the proposed anesthesia with the patient or authorized representative who has indicated his/her understanding and acceptance.     Dental advisory given  Plan Discussed with: CRNA  Anesthesia Plan Comments:         Anesthesia Quick Evaluation

## 2023-09-10 NOTE — H&P (Signed)
Midge Minium, MD [facility] [address] [place], [place] [address] Phone:[phone] Fax : [phone]  Primary Care Physician:  Glori Luis, MD Primary Gastroenterologist:  Dr. Servando Snare  Pre-Procedure History & Physical: HPI:  Ronald Hunter is a 72 y.o. male is here for an colonoscopy.   Past Medical History:  Diagnosis Date   Anxiety    Arthritis    Chronic back pain    stenosis/spondylosis   Dysplastic nevus 11/15/2021   L mid to low back paraspinal, 2.0 cm lat to spine, Severe, Excised 01/16/22   Dysplastic nevus 11/15/2021   L mid to low back lateral, 11.0cm lat to spinal, moderate atypia   Dysplastic nevus 01/23/2022   R infrascapular - mod to severe, Excised 03/13/22   Dysplastic nevus 01/23/2022   L mid back 7.0 cm lat to spine and inf to L mid back 6.0 cm lat to spine - severe, Excised 01/30/22   Dysplastic nevus 01/23/2022   L mid back 6.0 cm lat to spine - moderate   Dysplastic nevus 02/17/2022   mid back spinal sup - mod   Dysplastic nevus 02/17/2022   mid back spinal inf - mod   Dysplastic nevus 02/17/2022   right mid back 12.5 cm lat to spine inf - mod to severe   Dysplastic nevus 02/17/2022   right mid back 12.5 cm lat to spine sup - severe - Shave removal 04/10/2022   Dysplastic nevus 11/21/2022   right lat back near side - moderate   Dysplastic nevus 05/30/2023   left low back 7 cm to the spine - moderate   Dysplastic nevus 05/30/2023   L mid back infra scapular - moderate   History of bronchitis    as a child   History of colon polyps    History of kidney stones    Hyperlipidemia    takes Crestor daily   Hypertension    takes Metoprolol daily   PONV (postoperative nausea and vomiting)    Nausea with one surgery    Past Surgical History:  Procedure Laterality Date   COLONOSCOPY     COLONOSCOPY     several   COLONOSCOPY W/ POLYPECTOMY     COLONOSCOPY WITH PROPOFOL N/A 08/26/2018   Procedure: COLONOSCOPY WITH PROPOFOL;  Surgeon:  Midge Minium, MD;  Location: [facility] ENDOSCOPY;  Service: Endoscopy;  Laterality: N/A;   cyst removed from under left arm     KNEE ARTHROSCOPY     right    KNEE ARTHROSCOPY W/ ACL RECONSTRUCTION     LUMBAR LAMINECTOMY/DECOMPRESSION MICRODISCECTOMY  08/11/2012   Procedure: LUMBAR LAMINECTOMY/DECOMPRESSION MICRODISCECTOMY 1 LEVEL;  Surgeon: Hewitt Shorts, MD;  Location: [facility] NEURO ORS;  Service: Neurosurgery;  Laterality: Right;  RIGHT L45 laminotomy and microdiskectomy   LUMBAR LAMINECTOMY/DECOMPRESSION MICRODISCECTOMY Right 01/29/2015   Procedure: LUMBAR LAMINECTOMY/DECOMPRESSION MICRODISCECTOMY 1 LEVEL;  Surgeon: Hewitt Shorts, MD;  Location: [facility] NEURO ORS;  Service: Neurosurgery;  Laterality: Right;  Right L45 Laminotomy and microdisketomy   SHOULDER SURGERY     right    TONSILLECTOMY      Prior to Admission medications   Medication Sig Start Date End Date Taking? Authorizing Provider  gabapentin (NEURONTIN) 100 MG capsule TAKE 1 CAPSULE BY MOUTH AT BEDTIME AS NEEDED 08/22/23  Yes Glori Luis, MD  rosuvastatin (CRESTOR) 40 MG tablet Take 1 tablet (40 mg total) by mouth daily. 11/23/22  Yes Glori Luis, MD  aspirin 81 MG tablet Take 81 mg by mouth daily.  [provider]  hydrocortisone 2.5 % cream Apply topically 3 (three) times a week. Apply to scaly areas on face 3 nights weekly, Tuesday, Thursday and Saturdays 11/15/21   Deirdre Evener, MD  lisinopril (ZESTRIL) 40 MG tablet Take 1 tablet (40 mg total) by mouth daily. 11/23/22   Glori Luis, MD  Multiple Vitamin (MULTIVITAMIN WITH MINERALS) TABS Take 1 tablet by mouth daily.    [provider]    Allergies as of 06/03/2023   (No Known Allergies)    Family History  Problem Relation Age of Onset   Hyperlipidemia Mother    Heart disease Mother    Congestive Heart Failure Mother    Colon cancer Father     Social History   Socioeconomic History   Marital status: Married    Spouse name:  Lurena Joiner   Number of children: 1   Years of education: Not on file   Highest education level: Associate degree: occupational, Scientist, product/process development, or vocational program  Occupational History    Comment: retired   Occupation: retired Research officer, political party  Tobacco Use   Smoking status: Never   Smokeless tobacco: Never  Vaping Use   Vaping status: Never Used  Substance and Sexual Activity   Alcohol use: Yes    Alcohol/week: 15.0 standard drinks of alcohol    Types: 14 Glasses of wine, 1 Standard drinks or equivalent per week    Comment: 2 glasses of wine with dinner, 1 martini every Friday night   Drug use: No   Sexual activity: Yes  Other Topics Concern   Not on file  Social History Narrative   Married to [place], has 1 son that lives in [place]   Social Determinants of Health   Financial Resource Strain: Low Risk  (07/21/2023)   Overall Financial Resource Strain (CARDIA)    Difficulty of Paying Living Expenses: Not hard at all  Food Insecurity: No Food Insecurity (07/21/2023)   Hunger Vital Sign    Worried About Running Out of Food in the Last Year: Never true    Ran Out of Food in the Last Year: Never true  Transportation Needs: No Transportation Needs (07/21/2023)   PRAPARE - Administrator, Civil Service (Medical): No    Lack of Transportation (Non-Medical): No  Physical Activity: Sufficiently Active (07/21/2023)   Exercise Vital Sign    Days of Exercise per Week: 6 days    Minutes of Exercise per Session: 50 min  Stress: No Stress Concern Present (07/21/2023)   [employer] of Occupational Health - Occupational Stress Questionnaire    Feeling of Stress : Only a little  Social Connections: Socially Isolated (07/21/2023)   Social Connection and Isolation Panel [NHANES]    Frequency of Communication with Friends and Family: Never    Frequency of Social Gatherings with Friends and Family: Never    Attends Religious Services: Never    Database administrator or Organizations: No     Attends Banker Meetings: Never    Marital Status: Married  Catering manager Violence: Not At Risk (07/25/2023)   Humiliation, Afraid, Rape, and Kick questionnaire    Fear of Current or Ex-Partner: No    Emotionally Abused: No    Physically Abused: No    Sexually Abused: No    Review of Systems: See HPI, otherwise negative ROS  Physical Exam: BP (!) 160/80   Pulse 76   Temp 97.7 F (36.5 C) (Temporal)   Resp 16   Wt  102.1 kg   SpO2 99%   BMI 30.52 kg/m  General:   Alert,  pleasant and cooperative in NAD Head:  Normocephalic and atraumatic. Neck:  Supple; no masses or thyromegaly. Lungs:  Clear throughout to auscultation.    Heart:  Regular rate and rhythm. Abdomen:  Soft, nontender and nondistended. Normal bowel sounds, without guarding, and without rebound.   Neurologic:  Alert and  oriented x4;  grossly normal neurologically.  Impression/Plan: Ronald Hunter is here for an colonoscopy to be performed for a history of adenomatous polyps on 2019   Risks, benefits, limitations, and alternatives regarding  colonoscopy have been reviewed with the patient.  Questions have been answered.  All parties agreeable.   Midge Minium, MD  09/10/2023, 7:54 AM

## 2023-09-10 NOTE — Op Note (Signed)
[facility] Gastroenterology Patient Name: Ronald Hunter Procedure Date: 09/10/2023 7:32 AM MRN: [id number] Account #: [id number] Date of Birth: [birth year] Admit Type: Outpatient Age: 72 Room: [facility] ENDO ROOM 4 Gender: Male Note Status: Finalized Instrument Name: Prentice Docker [id number] Procedure:             Colonoscopy Indications:           High risk colon cancer surveillance: Personal history                         of colonic polyps Providers:             Midge Minium MD, MD Medicines:             Propofol per Anesthesia Complications:         No immediate complications. Procedure:             Pre-Anesthesia Assessment:                        - Prior to the procedure, a History and Physical was                         performed, and patient medications and allergies were                         reviewed. The patient's tolerance of previous                         anesthesia was also reviewed. The risks and benefits                         of the procedure and the sedation options and risks                         were discussed with the patient. All questions were                         answered, and informed consent was obtained. Prior                         Anticoagulants: The patient has taken no anticoagulant                         or antiplatelet agents. ASA Grade Assessment: II - A                         patient with mild systemic disease. After reviewing                         the risks and benefits, the patient was deemed in                         satisfactory condition to undergo the procedure.                        After obtaining informed consent, the colonoscope was                         passed under direct vision. Throughout  the procedure,                         the patient's blood pressure, pulse, and oxygen                         saturations were monitored continuously. The                         Colonoscope was introduced through the anus  and                         advanced to the the cecum, identified by appendiceal                         orifice and ileocecal valve. The colonoscopy was                         performed without difficulty. The patient tolerated                         the procedure well. The quality of the bowel                         preparation was poor. Findings:      The perianal and digital rectal examinations were normal.      Two sessile polyps were found in the transverse colon. The polyps were 3       to 5 mm in size. These polyps were removed with a cold snare. Resection       and retrieval were complete.      A 4 mm polyp was found in the sigmoid colon. The polyp was sessile. The       polyp was removed with a cold snare. Resection and retrieval were       complete.      A moderate amount of semi-solid stool was found in the entire colon,       precluding visualization. Impression:            - Preparation of the colon was poor.                        - Two 3 to 5 mm polyps in the transverse colon,                         removed with a cold snare. Resected and retrieved.                        - One 4 mm polyp in the sigmoid colon, removed with a                         cold snare. Resected and retrieved.                        - Stool in the entire examined colon. Recommendation:        - Discharge patient to home.                        - Resume previous diet.                        -  Continue present medications.                        - Await pathology results.                        - Repeat colonoscopy in 1 year for surveillance. Procedure Code(s):     --- Professional ---                        [phone], Colonoscopy, flexible; with removal of                         tumor(s), polyp(s), or other lesion(s) by snare                         technique Diagnosis Code(s):     --- Professional ---                        Z86.010, Personal history of colonic polyps                         D12.3, Benign neoplasm of transverse colon (hepatic                         flexure or splenic flexure) CPT copyright 2022 American Medical Association. All rights reserved. The codes documented in this report are preliminary and upon coder review may  be revised to meet current compliance requirements. Midge Minium MD, MD 09/10/2023 8:18:16 AM This report has been signed electronically. Number of Addenda: 0 Note Initiated On: 09/10/2023 7:32 AM Scope Withdrawal Time: 0 hours 6 minutes 42 seconds  Total Procedure Duration: 0 hours 14 minutes 15 seconds  Estimated Blood Loss:  Estimated blood loss: none.      [facility]

## 2023-09-11 ENCOUNTER — Encounter: Payer: Self-pay | Admitting: Gastroenterology

## 2023-09-12 ENCOUNTER — Encounter: Payer: Self-pay | Admitting: Gastroenterology

## 2023-09-12 LAB — SURGICAL PATHOLOGY

## 2023-09-28 NOTE — Anesthesia Postprocedure Evaluation (Signed)
Anesthesia Post Note  Patient: Ronald Hunter  Procedure(s) Performed: COLONOSCOPY WITH PROPOFOL POLYPECTOMY  Patient location during evaluation: Endoscopy Anesthesia Type: General Level of consciousness: awake and alert Pain management: pain level controlled Vital Signs Assessment: post-procedure vital signs reviewed and stable Respiratory status: spontaneous breathing, nonlabored ventilation, respiratory function stable and patient connected to nasal cannula oxygen Cardiovascular status: blood pressure returned to baseline and stable Postop Assessment: no apparent nausea or vomiting Anesthetic complications: no   No notable events documented.   Last Vitals:  Vitals:   09/10/23 0830 09/10/23 0840  BP: (!) 113/56 129/61  Pulse: 72   Resp: (!) 21   Temp:    SpO2: 97%     Last Pain:  Vitals:   09/11/23 0747  TempSrc:   PainSc: 0-No pain                 Lenard Simmer

## 2023-10-30 DIAGNOSIS — Z23 Encounter for immunization: Secondary | ICD-10-CM | POA: Diagnosis not present

## 2023-11-27 ENCOUNTER — Ambulatory Visit (INDEPENDENT_AMBULATORY_CARE_PROVIDER_SITE_OTHER): Payer: Medicare Other | Admitting: Family Medicine

## 2023-11-27 ENCOUNTER — Encounter: Payer: Self-pay | Admitting: Family Medicine

## 2023-11-27 VITALS — BP 132/82 | HR 64 | Temp 98.5°F | Ht 72.0 in | Wt 231.0 lb

## 2023-11-27 DIAGNOSIS — G8929 Other chronic pain: Secondary | ICD-10-CM | POA: Diagnosis not present

## 2023-11-27 DIAGNOSIS — M545 Low back pain, unspecified: Secondary | ICD-10-CM | POA: Diagnosis not present

## 2023-11-27 DIAGNOSIS — I10 Essential (primary) hypertension: Secondary | ICD-10-CM

## 2023-11-27 MED ORDER — GABAPENTIN 100 MG PO CAPS
100.0000 mg | ORAL_CAPSULE | Freq: Every evening | ORAL | 1 refills | Status: DC | PRN
Start: 2023-11-27 — End: 2024-05-27

## 2023-11-27 NOTE — Patient Instructions (Addendum)
Nice to see you. We will contact you with your labs. Dr. Para March and Dr. Sharen Hones are good options at [facility]. [facility] is also an option.  Dr. Althea Charon is there. [facility] may also be an option.

## 2023-11-27 NOTE — Progress Notes (Signed)
Marikay Alar, MD Phone: [phone]  Ronald Hunter is a 72 y.o. male who presents today for follow-up  Hypertension: Not checking much that when he does it is 120s-130s/80s.  Taking lisinopril.  No chest pain, shortness of breath, or edema.  Neuropathy related to prior low back surgery: Patient notes nightly having symptoms of fiery pins-and-needles in his right foot.  He does take gabapentin though does not take it nightly.  No drowsiness with this.  He has bought a sock that he is going to try to see if that helps with his symptoms.  Social History   Tobacco Use  Smoking Status Never  Smokeless Tobacco Never    Current Outpatient Medications on File Prior to Visit  Medication Sig Dispense Refill   aspirin 81 MG tablet Take 81 mg by mouth daily.     hydrocortisone 2.5 % cream Apply topically 3 (three) times a week. Apply to scaly areas on face 3 nights weekly, Tuesday, Thursday and Saturdays 30 g 6   lisinopril (ZESTRIL) 40 MG tablet Take 1 tablet (40 mg total) by mouth daily. 90 tablet 3   Multiple Vitamin (MULTIVITAMIN WITH MINERALS) TABS Take 1 tablet by mouth daily.     rosuvastatin (CRESTOR) 40 MG tablet Take 1 tablet (40 mg total) by mouth daily. 90 tablet 3   No current facility-administered medications on file prior to visit.     ROS see history of present illness  Objective  Physical Exam Vitals:   11/27/23 0824 11/27/23 0825  BP: 138/84 132/82  Pulse: 64   Temp: 98.5 F (36.9 C)   SpO2: 97%     BP Readings from Last 3 Encounters:  11/27/23 132/82  09/10/23 129/61  05/30/23 120/74   Wt Readings from Last 3 Encounters:  11/27/23 231 lb (104.8 kg)  09/10/23 225 lb (102.1 kg)  07/25/23 230 lb (104.3 kg)    Physical Exam Constitutional:      General: He is not in acute distress.    Appearance: He is not diaphoretic.  Cardiovascular:     Rate and Rhythm: Normal rate and regular rhythm.     Heart sounds: Normal heart sounds.  Pulmonary:     Effort:  Pulmonary effort is normal.     Breath sounds: Normal breath sounds.  Skin:    General: Skin is warm and dry.  Neurological:     Mental Status: He is alert.      Assessment/Plan: Please see individual problem list.  Essential hypertension Assessment & Plan: Chronic issue.  Discussed borderline blood pressure.  Advise checking more frequently at home and if it is consistently greater than 130s over 80s he will let us know.  He will continue lisinopril 40 mg daily.  Check BMP today.  Orders: -     Basic metabolic panel  Chronic low back pain without sciatica, unspecified back pain laterality Assessment & Plan: Chronic issue with nerve pain in his right foot.  Discussed we could increase his gabapentin or he could try taking it every night.  Patient wants to use the sock.  Advised if it is a compression sock he should avoid use at night.  Discussed continuing gabapentin 100 mg by mouth at bedtime as needed.  Orders: -     Gabapentin; Take 1 capsule (100 mg total) by mouth at bedtime as needed.  Dispense: 90 capsule; Refill: 1    Return in about 6 months (around 05/27/2024) for Transfer of care.   Marikay Alar, MD [place]  [facility] - [employer] [facility]

## 2023-11-27 NOTE — Assessment & Plan Note (Signed)
Chronic issue.  Discussed borderline blood pressure.  Advise checking more frequently at home and if it is consistently greater than 130s over 80s he will let us know.  He will continue lisinopril 40 mg daily.  Check BMP today.

## 2023-11-27 NOTE — Assessment & Plan Note (Signed)
Chronic issue with nerve pain in his right foot.  Discussed we could increase his gabapentin or he could try taking it every night.  Patient wants to use the sock.  Advised if it is a compression sock he should avoid use at night.  Discussed continuing gabapentin 100 mg by mouth at bedtime as needed.

## 2023-11-28 LAB — BASIC METABOLIC PANEL
BUN: 11 mg/dL (ref 7–25)
CO2: 26 mmol/L (ref 20–32)
Calcium: 9.4 mg/dL (ref 8.6–10.3)
Chloride: 106 mmol/L (ref 98–110)
Creat: 0.75 mg/dL (ref 0.70–1.28)
Glucose, Bld: 95 mg/dL (ref 65–99)
Potassium: 4.5 mmol/L (ref 3.5–5.3)
Sodium: 141 mmol/L (ref 135–146)

## 2023-12-04 ENCOUNTER — Ambulatory Visit: Payer: Medicare Other | Admitting: Dermatology

## 2023-12-04 DIAGNOSIS — D1801 Hemangioma of skin and subcutaneous tissue: Secondary | ICD-10-CM

## 2023-12-04 DIAGNOSIS — I878 Other specified disorders of veins: Secondary | ICD-10-CM

## 2023-12-04 DIAGNOSIS — Z86018 Personal history of other benign neoplasm: Secondary | ICD-10-CM

## 2023-12-04 DIAGNOSIS — L738 Other specified follicular disorders: Secondary | ICD-10-CM

## 2023-12-04 DIAGNOSIS — L814 Other melanin hyperpigmentation: Secondary | ICD-10-CM | POA: Diagnosis not present

## 2023-12-04 DIAGNOSIS — L820 Inflamed seborrheic keratosis: Secondary | ICD-10-CM | POA: Diagnosis not present

## 2023-12-04 DIAGNOSIS — Z1283 Encounter for screening for malignant neoplasm of skin: Secondary | ICD-10-CM | POA: Diagnosis not present

## 2023-12-04 DIAGNOSIS — L918 Other hypertrophic disorders of the skin: Secondary | ICD-10-CM

## 2023-12-04 DIAGNOSIS — L578 Other skin changes due to chronic exposure to nonionizing radiation: Secondary | ICD-10-CM | POA: Diagnosis not present

## 2023-12-04 DIAGNOSIS — W908XXA Exposure to other nonionizing radiation, initial encounter: Secondary | ICD-10-CM | POA: Diagnosis not present

## 2023-12-04 DIAGNOSIS — L821 Other seborrheic keratosis: Secondary | ICD-10-CM | POA: Diagnosis not present

## 2023-12-04 DIAGNOSIS — D229 Melanocytic nevi, unspecified: Secondary | ICD-10-CM

## 2023-12-04 NOTE — Progress Notes (Signed)
Follow-Up Visit   Subjective  Ronald Hunter is a 72 y.o. male who presents for the following: Skin Cancer Screening and Full Body Skin Exam Hx of multiple dysplastic nevi    The patient presents for Total-Body Skin Exam (TBSE) for skin cancer screening and mole check. The patient has spots, moles and lesions to be evaluated, some may be new or changing and the patient may have concern these could be cancer.    The following portions of the chart were reviewed this encounter and updated as appropriate: medications, allergies, medical history  Review of Systems:  No other skin or systemic complaints except as noted in HPI or Assessment and Plan.  Objective  Well appearing patient in no apparent distress; mood and affect are within normal limits.  A full examination was performed including scalp, head, eyes, ears, nose, lips, neck, chest, axillae, abdomen, back, buttocks, bilateral upper extremities, bilateral lower extremities, hands, feet, fingers, toes, fingernails, and toenails. All findings within normal limits unless otherwise noted below.   Relevant physical exam findings are noted in the Assessment and Plan.  right temple x 2 (2) Erythematous stuck-on, waxy papule or plaque  Assessment & Plan   SKIN CANCER SCREENING PERFORMED TODAY.  Sebaceous Hyperplasia At forehead - Small yellow papules with a central dell - Benign-appearing - Observe. Call for changes.   ACTINIC DAMAGE - Chronic condition, secondary to cumulative UV/sun exposure - diffuse scaly erythematous macules with underlying dyspigmentation - Recommend daily broad spectrum sunscreen SPF 30+ to sun-exposed areas, reapply every 2 hours as needed.  - Staying in the shade or wearing long sleeves, sun glasses (UVA+UVB protection) and wide brim hats (4-inch brim around the entire circumference of the hat) are also recommended for sun protection.  - Call for new or changing lesions.  LENTIGINES, SEBORRHEIC  KERATOSES, HEMANGIOMAS - Benign normal skin lesions - Benign-appearing - Call for any changes   [place] Exam: red or purple papule at right ear antihelix   Treatment Plan:  Benign-appearing. Observation. Call clinic for new or changing lesions. Recommend daily use of broad spectrum spf 30+ sunscreen to sun-exposed areas.   Acrochordons (Skin Tags) - Fleshy, skin-colored pedunculated papules - Benign appearing.  - Observe. - If desired, they can be removed with an in office procedure that is not covered by insurance. - Please call the clinic if you notice any new or changing lesions.   MELANOCYTIC NEVI - Tan-brown and/or pink-flesh-colored symmetric macules and papules - Benign appearing on exam today - Observation - Call clinic for new or changing moles - Recommend daily use of broad spectrum spf 30+ sunscreen to sun-exposed areas.   HISTORY OF DYSPLASTIC NEVUS Multiple areas see history  Most recent 05/30/2023 No evidence of recurrence today Recommend regular full body skin exams Recommend daily broad spectrum sunscreen SPF 30+ to sun-exposed areas, reapply every 2 hours as needed.  Call if any new or changing lesions are noted between office visits  INFLAMED SEBORRHEIC KERATOSIS (2) right temple x 2 (2) Symptomatic, irritating, patient would like treated. Destruction of lesion - right temple x 2 (2) Complexity: simple   Destruction method: cryotherapy   Informed consent: discussed and consent obtained   Timeout:  patient name, date of birth, surgical site, and procedure verified Lesion destroyed using liquid nitrogen: Yes   Region frozen until ice ball extended beyond lesion: Yes   Outcome: patient tolerated procedure well with no complications   Post-procedure details: wound care instructions given  HISTORY OF DYSPLASTIC NEVUS   ACTINIC SKIN DAMAGE   LENTIGO   MELANOCYTIC NEVUS, UNSPECIFIED LOCATION   SKIN CANCER SCREENING   SKIN TAGS, MULTIPLE  ACQUIRED   Return for 6 - 8 month ubse .  IAsher Muir, CMA, am acting as scribe for Armida Sans, MD.   Documentation: I have reviewed the above documentation for accuracy and completeness, and I agree with the above.  Armida Sans, MD

## 2023-12-04 NOTE — Patient Instructions (Addendum)

## 2023-12-14 ENCOUNTER — Encounter: Payer: Self-pay | Admitting: Dermatology

## 2023-12-20 ENCOUNTER — Telehealth: Payer: Self-pay | Admitting: Family Medicine

## 2023-12-20 NOTE — Telephone Encounter (Signed)
 Pt wife stop by inquiring about her husband and she is stating that she has spoken with Dr. Duncan Dull about her husband becoming a new pt with her instead of Dana Allan. Please advise.  Best call back is [phone]

## 2023-12-23 ENCOUNTER — Other Ambulatory Visit: Payer: Self-pay | Admitting: Family Medicine

## 2023-12-23 DIAGNOSIS — E785 Hyperlipidemia, unspecified: Secondary | ICD-10-CM

## 2023-12-23 DIAGNOSIS — I10 Essential (primary) hypertension: Secondary | ICD-10-CM

## 2024-03-20 ENCOUNTER — Other Ambulatory Visit: Payer: Self-pay

## 2024-03-20 DIAGNOSIS — E785 Hyperlipidemia, unspecified: Secondary | ICD-10-CM

## 2024-03-20 MED ORDER — ROSUVASTATIN CALCIUM 40 MG PO TABS
40.0000 mg | ORAL_TABLET | Freq: Every day | ORAL | 1 refills | Status: DC
Start: 2024-03-20 — End: 2024-07-15

## 2024-04-15 ENCOUNTER — Other Ambulatory Visit: Payer: Self-pay

## 2024-04-15 DIAGNOSIS — I10 Essential (primary) hypertension: Secondary | ICD-10-CM

## 2024-04-15 MED ORDER — LISINOPRIL 40 MG PO TABS
40.0000 mg | ORAL_TABLET | Freq: Every day | ORAL | 0 refills | Status: DC
Start: 2024-04-15 — End: 2024-07-15

## 2024-04-15 NOTE — Telephone Encounter (Signed)
 Pt's wife reached out and stated that pt is needing a refill on his Lisinopril . Pt was scheduled to establish with you in May but has been rescheduled for July. Pt is down to his last 3 pills. I have pended the medication for your approval to fill.

## 2024-05-08 ENCOUNTER — Encounter: Payer: Medicare Other | Admitting: Internal Medicine

## 2024-05-21 ENCOUNTER — Other Ambulatory Visit: Payer: Self-pay

## 2024-05-21 ENCOUNTER — Telehealth: Payer: Self-pay

## 2024-05-21 DIAGNOSIS — Z800 Family history of malignant neoplasm of digestive organs: Secondary | ICD-10-CM

## 2024-05-21 DIAGNOSIS — Z860100 Personal history of colon polyps, unspecified: Secondary | ICD-10-CM

## 2024-05-21 MED ORDER — NA SULFATE-K SULFATE-MG SULF 17.5-3.13-1.6 GM/177ML PO SOLN: 1.0000 | Freq: Once | ORAL | 0 refills | Status: AC

## 2024-05-21 NOTE — Telephone Encounter (Signed)
 Gastroenterology Pre-Procedure Review  Request Date: 09/10/24 Requesting Physician: Dr. Ole Berkeley  PATIENT REVIEW QUESTIONS: The patient responded to the following health history questions as indicated:    1. Are you having any GI issues? no 2. Do you have a personal history of Polyps? yes (last colonoscopy performed by Dr. Ole Berkeley 09/10/23 recommended repeat in 1 year due to precancerous polyps) 3. Do you have a family history of Colon Cancer or Polyps? yes (father colon cancer) 4. Diabetes Mellitus? no 5. Joint replacements in the past 12 months?no 6. Major health problems in the past 3 months?no 7. Any artificial heart valves, MVP, or defibrillator?no    MEDICATIONS & ALLERGIES:    Patient reports the following regarding taking any anticoagulation/antiplatelet therapy:   Plavix, Coumadin, Eliquis, Xarelto, Lovenox , Pradaxa, Brilinta, or Effient? no Aspirin ? Yes 81 mg daily  Patient confirms/reports the following medications:  Current Outpatient Medications  Medication Sig Dispense Refill   aspirin  81 MG tablet Take 81 mg by mouth daily.     gabapentin  (NEURONTIN ) 100 MG capsule Take 1 capsule (100 mg total) by mouth at bedtime as needed. 90 capsule 1   hydrocortisone  2.5 % cream Apply topically 3 (three) times a week. Apply to scaly areas on face 3 nights weekly, Tuesday, Thursday and Saturdays 30 g 6   lisinopril  (ZESTRIL ) 40 MG tablet Take 1 tablet (40 mg total) by mouth daily. 90 tablet 0   Multiple Vitamin (MULTIVITAMIN WITH MINERALS) TABS Take 1 tablet by mouth daily.     rosuvastatin  (CRESTOR ) 40 MG tablet Take 1 tablet (40 mg total) by mouth daily. 90 tablet 1   No current facility-administered medications for this visit.    Patient confirms/reports the following allergies:  No Known Allergies  No orders of the defined types were placed in this encounter.   AUTHORIZATION INFORMATION Primary Insurance: 1D#: Group #:  Secondary Insurance: 1D#: Group #:  SCHEDULE  INFORMATION: Date: 09/10/24 Time: Location: [facility]

## 2024-05-22 ENCOUNTER — Other Ambulatory Visit: Payer: Self-pay | Admitting: Internal Medicine

## 2024-05-22 DIAGNOSIS — M545 Low back pain, unspecified: Secondary | ICD-10-CM

## 2024-05-22 NOTE — Telephone Encounter (Signed)
 Copied from CRM #[id number]. Topic: Clinical - Medication Refill >> May 22, 2024  9:58 AM Deaijah H wrote: Medication: gabapentin  (NEURONTIN ) 100 MG capsule  Has the patient contacted their pharmacy? Yes (Agent: If no, request that the patient contact the pharmacy for the refill. If patient does not wish to contact the pharmacy document the reason why and proceed with request.) (Agent: If yes, when and what did the pharmacy advise?)  This is the patient's preferred pharmacy:  [facility] [address], [place] - [address] [place] [address] Phone: [phone] Fax: [phone]  Is this the correct pharmacy for this prescription? Yes If no, delete pharmacy and type the correct one.   Has the prescription been filled recently? No  Is the patient out of the medication? No, 2-3 left  Has the patient been seen for an appointment in the last year OR does the patient have an upcoming appointment? Yes  Can we respond through MyChart? Yes  Agent: Please be advised that Rx refills may take up to 3 business days. We ask that you follow-up with your pharmacy.

## 2024-05-27 ENCOUNTER — Other Ambulatory Visit: Payer: Self-pay

## 2024-05-27 DIAGNOSIS — M545 Low back pain, unspecified: Secondary | ICD-10-CM

## 2024-05-27 MED ORDER — GABAPENTIN 100 MG PO CAPS: 100.0000 mg | ORAL_CAPSULE | Freq: Every evening | ORAL | 0 refills | Status: DC | PRN

## 2024-05-28 ENCOUNTER — Encounter: Payer: Medicare Other | Admitting: Family Medicine

## 2024-06-03 ENCOUNTER — Encounter: Payer: Self-pay | Admitting: Dermatology

## 2024-06-03 ENCOUNTER — Ambulatory Visit: Payer: Medicare Other | Admitting: Dermatology

## 2024-06-03 DIAGNOSIS — D225 Melanocytic nevi of trunk: Secondary | ICD-10-CM

## 2024-06-03 DIAGNOSIS — D1801 Hemangioma of skin and subcutaneous tissue: Secondary | ICD-10-CM

## 2024-06-03 DIAGNOSIS — L814 Other melanin hyperpigmentation: Secondary | ICD-10-CM

## 2024-06-03 DIAGNOSIS — W908XXA Exposure to other nonionizing radiation, initial encounter: Secondary | ICD-10-CM | POA: Diagnosis not present

## 2024-06-03 DIAGNOSIS — D229 Melanocytic nevi, unspecified: Secondary | ICD-10-CM

## 2024-06-03 DIAGNOSIS — L821 Other seborrheic keratosis: Secondary | ICD-10-CM

## 2024-06-03 DIAGNOSIS — L578 Other skin changes due to chronic exposure to nonionizing radiation: Secondary | ICD-10-CM

## 2024-06-03 DIAGNOSIS — Z1283 Encounter for screening for malignant neoplasm of skin: Secondary | ICD-10-CM | POA: Diagnosis not present

## 2024-06-03 DIAGNOSIS — D492 Neoplasm of unspecified behavior of bone, soft tissue, and skin: Secondary | ICD-10-CM

## 2024-06-03 DIAGNOSIS — L820 Inflamed seborrheic keratosis: Secondary | ICD-10-CM

## 2024-06-03 DIAGNOSIS — D485 Neoplasm of uncertain behavior of skin: Secondary | ICD-10-CM

## 2024-06-03 DIAGNOSIS — Z86018 Personal history of other benign neoplasm: Secondary | ICD-10-CM | POA: Diagnosis not present

## 2024-06-03 NOTE — Progress Notes (Deleted)
   Follow-Up Visit   Subjective  Ronald Hunter is a 73 y.o. male who presents for the following: Skin Cancer Screening and Full Body Skin Exam  The patient presents for Total-Body Skin Exam (TBSE) for skin cancer screening and mole check. The patient has spots, moles and lesions to be evaluated, some may be new or changing and the patient may have concern these could be cancer.  The following portions of the chart were reviewed this encounter and updated as appropriate: medications, allergies, medical history  Review of Systems:  No other skin or systemic complaints except as noted in HPI or Assessment and Plan.  Objective  Well appearing patient in no apparent distress; mood and affect are within normal limits.  A full examination was performed including scalp, head, eyes, ears, nose, lips, neck, chest, axillae, abdomen, back, buttocks, bilateral upper extremities, bilateral lower extremities, hands, feet, fingers, toes, fingernails, and toenails. All findings within normal limits unless otherwise noted below.   Relevant physical exam findings are noted in the Assessment and Plan.    Assessment & Plan   SKIN CANCER SCREENING PERFORMED TODAY.  ACTINIC DAMAGE - Chronic condition, secondary to cumulative UV/sun exposure - diffuse scaly erythematous macules with underlying dyspigmentation - Recommend daily broad spectrum sunscreen SPF 30+ to sun-exposed areas, reapply every 2 hours as needed.  - Staying in the shade or wearing long sleeves, sun glasses (UVA+UVB protection) and wide brim hats (4-inch brim around the entire circumference of the hat) are also recommended for sun protection.  - Call for new or changing lesions.  LENTIGINES, SEBORRHEIC KERATOSES, HEMANGIOMAS - Benign normal skin lesions - Benign-appearing - Call for any changes  MELANOCYTIC NEVI - Tan-brown and/or pink-flesh-colored symmetric macules and papules - Benign appearing on exam today - Observation - Call  clinic for new or changing moles - Recommend daily use of broad spectrum spf 30+ sunscreen to sun-exposed areas.   HISTORY OF DYSPLASTIC NEVUS No evidence of recurrence today Recommend regular full body skin exams Recommend daily broad spectrum sunscreen SPF 30+ to sun-exposed areas, reapply every 2 hours as needed.  Call if any new or changing lesions are noted between office visits  Return for TBSE in 6-8 mths.  Arlinda Lais, CMA, am acting as scribe for Celine Collard, MD .   Documentation: I have reviewed the above documentation for accuracy and completeness, and I agree with the above.  Celine Collard, MD

## 2024-06-03 NOTE — Progress Notes (Signed)
 Follow-Up Visit   Subjective  Ronald Hunter is a 73 y.o. male who presents for the following: Skin Cancer Screening and Upper Body Skin Exam  The patient presents for Upper Body Skin Exam (UBSE) for skin cancer screening and mole check. The patient has spots, moles and lesions to be evaluated, some may be new or changing and the patient may have concern these could be cancer.  The following portions of the chart were reviewed this encounter and updated as appropriate: medications, allergies, medical history  Review of Systems:  No other skin or systemic complaints except as noted in HPI or Assessment and Plan.  Objective  Well appearing patient in no apparent distress; mood and affect are within normal limits.  All skin waist up examined. Relevant physical exam findings are noted in the Assessment and Plan.  R low back x 2, L temple x 2, L shoulder x 11 (15) Erythematous stuck-on, waxy papule or plaque R ant side lat infra pectoral 0.6 cm irregular brown macule.   Assessment & Plan   INFLAMED SEBORRHEIC KERATOSIS (15) R low back x 2, L temple x 2, L shoulder x 11 (15) Symptomatic, irritating, patient would like treated.  Destruction of lesion - R low back x 2, L temple x 2, L shoulder x 11 (15) Complexity: simple   Destruction method: cryotherapy   Informed consent: discussed and consent obtained   Timeout:  patient name, date of birth, surgical site, and procedure verified Lesion destroyed using liquid nitrogen: Yes   Region frozen until ice ball extended beyond lesion: Yes   Outcome: patient tolerated procedure well with no complications   Post-procedure details: wound care instructions given   NEOPLASM OF UNCERTAIN BEHAVIOR OF SKIN R ant side lat infra pectoral Epidermal / dermal shaving  Lesion diameter (cm):  0.6 Informed consent: discussed and consent obtained   Timeout: patient name, date of birth, surgical site, and procedure verified   Procedure prep:  Patient  was prepped and draped in usual sterile fashion Prep type:  Isopropyl alcohol Anesthesia: the lesion was anesthetized in a standard fashion   Anesthetic:  1% lidocaine  w/ epinephrine  1-100,000 buffered w/ 8.4% NaHCO3 Instrument used: flexible razor blade   Hemostasis achieved with: pressure, aluminum chloride and electrodesiccation   Outcome: patient tolerated procedure well   Post-procedure details: sterile dressing applied and wound care instructions given   Dressing type: bandage (Mupirocin  2% ointment)   Specimen 1 - Surgical pathology Differential Diagnosis: D48.5 r/o dysplastic nevus Check Margins: Yes  Actinic Damage - Chronic condition, secondary to cumulative UV/sun exposure - diffuse scaly erythematous macules with underlying dyspigmentation - Recommend daily broad spectrum sunscreen SPF 30+ to sun-exposed areas, reapply every 2 hours as needed.  - Staying in the shade or wearing long sleeves, sun glasses (UVA+UVB protection) and wide brim hats (4-inch brim around the entire circumference of the hat) are also recommended for sun protection.  - Call for new or changing lesions.  Lentigines, Seborrheic Keratoses, Hemangiomas - Benign normal skin lesions - Benign-appearing - Call for any changes  Melanocytic Nevi - Tan-brown and/or pink-flesh-colored symmetric macules and papules - Benign appearing on exam today - Observation - Call clinic for new or changing moles - Recommend daily use of broad spectrum spf 30+ sunscreen to sun-exposed areas.   HISTORY OF DYSPLASTIC NEVI No evidence of recurrence today Recommend regular full body skin exams Recommend daily broad spectrum sunscreen SPF 30+ to sun-exposed areas, reapply every 2 hours as needed.  Call if any new or changing lesions are noted between office visits  Skin cancer screening performed today.  Return in about 1 year (around 06/03/2025) for TBSE.  Arlinda Lais, CMA, am acting as scribe for Celine Collard, MD  .   Documentation: I have reviewed the above documentation for accuracy and completeness, and I agree with the above.  Celine Collard, MD

## 2024-06-03 NOTE — Patient Instructions (Addendum)

## 2024-06-05 ENCOUNTER — Ambulatory Visit: Payer: Self-pay | Admitting: Dermatology

## 2024-06-05 LAB — SURGICAL PATHOLOGY

## 2024-06-08 NOTE — Telephone Encounter (Addendum)
 Called and discussed results with patient. He verbalized understanding and denied further questions. ----- Message from Alm Rhyme sent at 06/05/2024  7:22 PM EDT ----- FINAL DIAGNOSIS        1. Skin, R ant side lat infra pectoral :       MELANOCYTIC NEVUS, COMPOUND TYPE, PRESENT IN BASE   Benign mole No further treatment needed ----- Message ----- From: Interface, Lab In Three Zero One Sent: 06/05/2024  11:49 AM EDT To: Alm JAYSON Rhyme, MD

## 2024-07-15 ENCOUNTER — Encounter: Payer: Self-pay | Admitting: Internal Medicine

## 2024-07-15 ENCOUNTER — Ambulatory Visit (INDEPENDENT_AMBULATORY_CARE_PROVIDER_SITE_OTHER): Admitting: Internal Medicine

## 2024-07-15 VITALS — BP 140/78 | HR 64 | Temp 98.0°F | Ht 73.0 in | Wt 239.4 lb

## 2024-07-15 DIAGNOSIS — R7301 Impaired fasting glucose: Secondary | ICD-10-CM | POA: Diagnosis not present

## 2024-07-15 DIAGNOSIS — M545 Low back pain, unspecified: Secondary | ICD-10-CM | POA: Diagnosis not present

## 2024-07-15 DIAGNOSIS — Z1211 Encounter for screening for malignant neoplasm of colon: Secondary | ICD-10-CM

## 2024-07-15 DIAGNOSIS — E66811 Obesity, class 1: Secondary | ICD-10-CM | POA: Diagnosis not present

## 2024-07-15 DIAGNOSIS — Z800 Family history of malignant neoplasm of digestive organs: Secondary | ICD-10-CM | POA: Diagnosis not present

## 2024-07-15 DIAGNOSIS — E785 Hyperlipidemia, unspecified: Secondary | ICD-10-CM

## 2024-07-15 DIAGNOSIS — R5383 Other fatigue: Secondary | ICD-10-CM

## 2024-07-15 DIAGNOSIS — Z125 Encounter for screening for malignant neoplasm of prostate: Secondary | ICD-10-CM

## 2024-07-15 DIAGNOSIS — G8929 Other chronic pain: Secondary | ICD-10-CM

## 2024-07-15 DIAGNOSIS — I10 Essential (primary) hypertension: Secondary | ICD-10-CM

## 2024-07-15 MED ORDER — GABAPENTIN 100 MG PO CAPS: 100.0000 mg | ORAL_CAPSULE | Freq: Every evening | ORAL | 0 refills | Status: DC | PRN

## 2024-07-15 MED ORDER — ROSUVASTATIN CALCIUM 40 MG PO TABS: 40.0000 mg | ORAL_TABLET | Freq: Every day | ORAL | 1 refills | Status: DC

## 2024-07-15 MED ORDER — LISINOPRIL 40 MG PO TABS
40.0000 mg | ORAL_TABLET | Freq: Every day | ORAL | 1 refills | Status: DC
Start: 2024-07-15 — End: 2024-07-16

## 2024-07-15 NOTE — Assessment & Plan Note (Signed)
 He is due for colonoscopy which is scheduled in September

## 2024-07-15 NOTE — Assessment & Plan Note (Addendum)
 Secondary to herniated disk  causing impingement of L4-5 right nerve roots.  Has had 2 surgeries by Nudel[facility] , last one in 2016 (prior to last MRI).  Several large fragments were removed, and then additional smaller fragments were removed. The epidural space was thoroughly examined, and no further fragments were found, and it was felt that we achieved good decompression of the thecal sac and exiting L4 and L5 nerve roots.   Currently his pain is manageable and his right foot drop is no worse.  He walks daily without tripping.

## 2024-07-15 NOTE — Patient Instructions (Addendum)
   I am recommending  that we change your lisinopril  to telmisartan , based on increased reports by several  of my ENT and GI  colleagues of patients  developing tongue and throat swelling from lisinopril .  The condition , called angioedema, can be fatal if a person's airway is compromised.   I will send in the telmisartan  tomorrow after I have reviewed your urine test for protein

## 2024-07-15 NOTE — Progress Notes (Unsigned)
 Subjective:  Patient ID: Ronald Hunter, male    DOB: [birth year]  Age: 73 y.o. MRN: [id number]  CC: The primary encounter diagnosis was Colon cancer screening. Diagnoses of Chronic low back pain without sciatica, unspecified back pain laterality, Essential hypertension, Hyperlipidemia, unspecified hyperlipidemia type, Prostate cancer screening, Impaired fasting glucose, and Other fatigue were also pertinent to this visit.   HPI Ronald Hunter presents for No chief complaint on file.   Chronic LBP since 2018.  Lifestyle limiting.  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  Neuropathy Has been Managed with gabapentin  low dose for the  last several years.  Has had  right foot drop  for a decade.   back injury in 1994 , treated by [facility] , had a diskectomy on L4-5  in August 2013,  recurrent , requiring repeated surgery in 2016.    Foot pain is worse by nightfall.  Left sided back pain has been flaring for the past 2 weeks for unclear resaons,  no trigger known.  Remoted used opioids but stopped due to ineffectiveness.  Using ice  and 500 mg aspirin  (?!) once daily  for the last 2 weeks.    HTN:  has white coat hypertension ,  home readings on  lisinopril   range fro 125 to 135 /75   Outpatient Medications Prior to Visit  Medication Sig Dispense Refill   aspirin  81 MG tablet Take 81 mg by mouth daily.     gabapentin  (NEURONTIN ) 100 MG capsule Take 1 capsule (100 mg total) by mouth at bedtime as needed. 30 capsule 0   hydrocortisone  2.5 % cream Apply topically 3 (three) times a week. Apply to scaly areas on face 3 nights weekly, Tuesday, Thursday and Saturdays 30 g 6   lisinopril  (ZESTRIL ) 40 MG tablet Take 1 tablet (40 mg total) by mouth daily. 90 tablet 0   Multiple Vitamin (MULTIVITAMIN WITH MINERALS) TABS Take 1 tablet by mouth daily.     rosuvastatin  (CRESTOR ) 40 MG tablet Take 1 tablet (40 mg total) by mouth daily. 90 tablet 1   No facility-administered medications prior to visit.     Review of Systems;  Patient denies headache, fevers, malaise, unintentional weight loss, skin rash, eye pain, sinus congestion and sinus pain, sore throat, dysphagia,  hemoptysis , cough, dyspnea, wheezing, chest pain, palpitations, orthopnea, edema, abdominal pain, nausea, melena, diarrhea, constipation, flank pain, dysuria, hematuria, urinary  Frequency, nocturia, numbness, tingling, seizures,  Focal weakness, Loss of consciousness,  Tremor, insomnia, depression, anxiety, and suicidal ideation.      Objective:  There were no vitals taken for this visit.  BP Readings from Last 3 Encounters:  11/27/23 132/82  09/10/23 129/61  05/30/23 120/74    Wt Readings from Last 3 Encounters:  11/27/23 231 lb (104.8 kg)  09/10/23 225 lb (102.1 kg)  07/25/23 230 lb (104.3 kg)    Physical Exam  Lab Results  Component Value Date   HGBA1C 5.5 04/13/2021   HGBA1C 5.6 04/22/2018    Lab Results  Component Value Date   CREATININE 0.75 11/27/2023   CREATININE 0.72 05/23/2023   CREATININE 0.70 11/19/2022    Lab Results  Component Value Date   WBC 6.3 04/13/2021   HGB 13.3 04/13/2021   HCT 37.6 (L) 04/13/2021   PLT 167 04/13/2021   GLUCOSE 95 11/27/2023   CHOL 148 05/23/2023   TRIG 117 05/23/2023   HDL 47 05/23/2023   LDLDIRECT 116 (H) 04/18/2020   LDLCALC  80 05/23/2023   ALT 24 05/23/2023   AST 21 05/23/2023   NA 141 11/27/2023   K 4.5 11/27/2023   CL 106 11/27/2023   CREATININE 0.75 11/27/2023   BUN 11 11/27/2023   CO2 26 11/27/2023   TSH 1.281 04/13/2021   PSA 3.47 11/06/2021   HGBA1C 5.5 04/13/2021    No results found.  Assessment & Plan:  .Colon cancer screening  Chronic low back pain without sciatica, unspecified back pain laterality  Essential hypertension  Hyperlipidemia, unspecified hyperlipidemia type  Prostate cancer screening  Impaired fasting glucose  Other fatigue     I spent 34 minutes on the day of this face to face encounter reviewing  patient's  most recent visit with cardiology,  nephrology,  and neurology,  prior relevant surgical and non surgical procedures, recent  labs and imaging studies, counseling on weight management,  reviewing the assessment and plan with patient, and post visit ordering and reviewing of  diagnostics and therapeutics with patient  .   Follow-up: No follow-ups on file.   Verneita LITTIE Kettering, MD

## 2024-07-16 ENCOUNTER — Ambulatory Visit: Payer: Self-pay | Admitting: Internal Medicine

## 2024-07-16 DIAGNOSIS — I10 Essential (primary) hypertension: Secondary | ICD-10-CM

## 2024-07-16 LAB — LIPID PANEL
Cholesterol: 170 mg/dL (ref <200)
HDL: 55 mg/dL (ref >=40)
LDL Cholesterol (Calc): 97 mg/dL
Non-HDL Cholesterol (Calc): 115 mg/dL (ref <130)
Total CHOL/HDL Ratio: 3.1 (calc) (ref <5.0)
Triglycerides: 86 mg/dL (ref <150)

## 2024-07-16 LAB — CBC WITH DIFFERENTIAL/PLATELET
Absolute Lymphocytes: 1935 {cells}/uL (ref 850–3900)
Absolute Monocytes: 464 {cells}/uL (ref 200–950)
Basophils Absolute: 18 {cells}/uL (ref 0–200)
Basophils Relative: 0.4 %
Eosinophils Absolute: 59 {cells}/uL (ref 15–500)
Eosinophils Relative: 1.3 %
HCT: 45.3 % (ref 38.5–50.0)
Hemoglobin: 15 g/dL (ref 13.2–17.1)
MCH: 31 pg (ref 27.0–33.0)
MCHC: 33.1 g/dL (ref 32.0–36.0)
MCV: 93.6 fL (ref 80.0–100.0)
MPV: 10.3 fL (ref 7.5–12.5)
Monocytes Relative: 10.3 %
Neutro Abs: 2025 {cells}/uL (ref 1500–7800)
Neutrophils Relative %: 45 %
Platelets: 175 Thousand/uL (ref 140–400)
RBC: 4.84 Million/uL (ref 4.20–5.80)
RDW: 13.6 % (ref 11.0–15.0)
Total Lymphocyte: 43 %
WBC: 4.5 Thousand/uL (ref 3.8–10.8)

## 2024-07-16 LAB — HEMOGLOBIN A1C
Hgb A1c MFr Bld: 5.7 % — ABNORMAL HIGH (ref <5.7)
Mean Plasma Glucose: 117 mg/dL
eAG (mmol/L): 6.5 mmol/L

## 2024-07-16 LAB — PSA: PSA: 4.48 ng/mL — ABNORMAL HIGH (ref <=4.00)

## 2024-07-16 LAB — COMPREHENSIVE METABOLIC PANEL WITH GFR
AG Ratio: 1.8 (calc) (ref 1.0–2.5)
ALT: 31 U/L (ref 9–46)
AST: 28 U/L (ref 10–35)
Albumin: 4.6 g/dL (ref 3.6–5.1)
Alkaline phosphatase (APISO): 60 U/L (ref 35–144)
BUN/Creatinine Ratio: 18 (calc) (ref 6–22)
BUN: 11 mg/dL (ref 7–25)
CO2: 26 mmol/L (ref 20–32)
Calcium: 9.4 mg/dL (ref 8.6–10.3)
Chloride: 104 mmol/L (ref 98–110)
Creat: 0.62 mg/dL — ABNORMAL LOW (ref 0.70–1.28)
Globulin: 2.5 g/dL (ref 1.9–3.7)
Glucose, Bld: 89 mg/dL (ref 65–99)
Potassium: 4.4 mmol/L (ref 3.5–5.3)
Sodium: 141 mmol/L (ref 135–146)
Total Bilirubin: 0.5 mg/dL (ref 0.2–1.2)
Total Protein: 7.1 g/dL (ref 6.1–8.1)
eGFR: 102 mL/min/1.73m2 (ref >=60)

## 2024-07-16 LAB — MICROALBUMIN / CREATININE URINE RATIO
Creatinine, Urine: 82 mg/dL (ref 20–320)
Microalb Creat Ratio: 23 mg/g{creat} (ref <30)
Microalb, Ur: 1.9 mg/dL

## 2024-07-16 LAB — TSH: TSH: 2.19 m[IU]/L (ref 0.40–4.50)

## 2024-07-16 LAB — LDL CHOLESTEROL, DIRECT: Direct LDL: 112 mg/dL — ABNORMAL HIGH (ref <100)

## 2024-07-16 MED ORDER — LISINOPRIL 40 MG PO TABS: 40.0000 mg | ORAL_TABLET | Freq: Every day | ORAL | Status: DC

## 2024-07-16 MED ORDER — TELMISARTAN 40 MG PO TABS: 40.0000 mg | ORAL_TABLET | Freq: Every day | ORAL | 1 refills | Status: DC

## 2024-07-16 NOTE — Assessment & Plan Note (Signed)
 Currenlty managed with lisinopril ,  but BP mildly elevated. He has no proteinuria. I am making a decision to change patient's ACE Inhibitor to an ARB  based on increased reports of  angioedema.    Lab Results  Component Value Date   NA 141 07/15/2024   K 4.4 07/15/2024   CL 104 07/15/2024   CO2 26 07/15/2024   Lab Results  Component Value Date   CREATININE 0.62 (L) 07/15/2024   Lab Results  Component Value Date   MICROALBUR 1.9 07/15/2024

## 2024-07-16 NOTE — Assessment & Plan Note (Signed)
 Patient has declined change in ACE I to ARB that I recommended

## 2024-07-16 NOTE — Assessment & Plan Note (Signed)
 PSA is now > 4; unclear PSA velocity as PSA was not checked in 2023/2024.  Referral to urology advised

## 2024-07-16 NOTE — Assessment & Plan Note (Signed)
 Diet and exercise reviewed..  current A1c borderline prediabetes range.  Low GI diet recommended   Lab Results  Component Value Date   HGBA1C 5.7 (H) 07/15/2024   Lab Results  Component Value Date   TSH 2.19 07/15/2024

## 2024-08-10 ENCOUNTER — Ambulatory Visit (INDEPENDENT_AMBULATORY_CARE_PROVIDER_SITE_OTHER): Admitting: *Deleted

## 2024-08-10 VITALS — Ht 72.0 in | Wt 230.0 lb

## 2024-08-10 DIAGNOSIS — Z0000 Encounter for general adult medical examination without abnormal findings: Secondary | ICD-10-CM | POA: Diagnosis not present

## 2024-08-10 NOTE — Progress Notes (Signed)
 Subjective:   Ronald Hunter is a 73 y.o. who presents for a Medicare Wellness preventive visit.  As a reminder, Annual Wellness Visits don't include a physical exam, and some assessments may be limited, especially if this visit is performed virtually. We may recommend an in-person follow-up visit with your provider if needed.  Visit Complete: Virtual I connected with  Ronald Hunter on 08/10/24 by a audio enabled telemedicine application and verified that I am speaking with the correct person using two identifiers.  Patient Location: Home  Provider Location: Home Office  I discussed the limitations of evaluation and management by telemedicine. The patient expressed understanding and agreed to proceed.  Vital Signs: Because this visit was a virtual/telehealth visit, some criteria may be missing or patient reported. Any vitals not documented were not able to be obtained and vitals that have been documented are patient reported.  VideoDeclined- This patient declined Librarian, academic. Therefore the visit was completed with audio only.  Persons Participating in Visit: Patient.  AWV Questionnaire: Yes: Patient Medicare AWV questionnaire was completed by the patient on 08/06/24; I have confirmed that all information answered by patient is correct and no changes since this date.  Cardiac Risk Factors include: advanced age (>31men, >35 women);male gender;hypertension;dyslipidemia;obesity (BMI >30kg/m2)     Objective:    Today's Vitals   08/10/24 1051 08/10/24 1052  Weight: 230 lb (104.3 kg)   Height: 6' (1.829 m)   PainSc:  5    Body mass index is 31.19 kg/m.     08/10/2024   11:11 AM 07/25/2023   10:51 AM 07/12/2022    9:17 AM 07/11/2021    8:50 AM 04/13/2021    3:04 AM 04/12/2021    6:06 PM 07/08/2020    8:46 AM  Advanced Directives  Does Patient Have a Medical Advance Directive? Yes Yes Yes Yes No No No  Type of Estate agent of  [place];Living will Healthcare Power of [place];Living will Healthcare Power of [place];Living will Healthcare Power of [place];Living will     Does patient want to make changes to medical advance directive?   No - Patient declined No - Patient declined     Copy of Healthcare Power of Attorney in Chart? No - copy requested No - copy requested No - copy requested No - copy requested     Would patient like information on creating a medical advance directive?     No - Patient declined  No - Patient declined    Current Medications (verified) Outpatient Encounter Medications as of 08/10/2024  Medication Sig   aspirin  500 MG tablet Take 500 mg by mouth every 6 (six) hours as needed for pain.   aspirin  81 MG tablet Take 81 mg by mouth daily.   gabapentin  (NEURONTIN ) 100 MG capsule Take 1 capsule (100 mg total) by mouth at bedtime as needed.   hydrocortisone  2.5 % cream Apply topically 3 (three) times a week. Apply to scaly areas on face 3 nights weekly, Tuesday, Thursday and Saturdays   lisinopril  (ZESTRIL ) 40 MG tablet Take 1 tablet (40 mg total) by mouth daily.   Multiple Vitamin (MULTIVITAMIN WITH MINERALS) TABS Take 1 tablet by mouth daily.   rosuvastatin  (CRESTOR ) 40 MG tablet Take 1 tablet (40 mg total) by mouth daily.   No facility-administered encounter medications on file as of 08/10/2024.    Allergies (verified) Patient has no known allergies.   History: Past Medical History:  Diagnosis Date  Anxiety    Arthritis    Chronic back pain    stenosis/spondylosis   Dysplastic nevus 11/15/2021   L mid to low back paraspinal, 2.0 cm lat to spine, Severe, Excised 01/16/22   Dysplastic nevus 11/15/2021   L mid to low back lateral, 11.0cm lat to spinal, moderate atypia   Dysplastic nevus 01/23/2022   R infrascapular - mod to severe, Excised 03/13/22   Dysplastic nevus 01/23/2022   L mid back 7.0 cm lat to spine and inf to L mid back 6.0 cm lat to spine - severe, Excised 01/30/22    Dysplastic nevus 01/23/2022   L mid back 6.0 cm lat to spine - moderate   Dysplastic nevus 02/17/2022   mid back spinal sup - mod   Dysplastic nevus 02/17/2022   mid back spinal inf - mod   Dysplastic nevus 02/17/2022   right mid back 12.5 cm lat to spine inf - mod to severe   Dysplastic nevus 02/17/2022   right mid back 12.5 cm lat to spine sup - severe - Shave removal 04/10/2022   Dysplastic nevus 11/21/2022   right lat back near side - moderate   Dysplastic nevus 05/30/2023   left low back 7 cm to the spine - moderate   Dysplastic nevus 05/30/2023   L mid back infra scapular - moderate   History of bronchitis    as a child   History of colon polyps    History of kidney stones    Hyperlipidemia    takes Crestor  daily   Hypertension    takes Metoprolol  daily   PONV (postoperative nausea and vomiting)    Nausea with one surgery   Past Surgical History:  Procedure Laterality Date   COLONOSCOPY     COLONOSCOPY     several   COLONOSCOPY W/ POLYPECTOMY     COLONOSCOPY WITH PROPOFOL  N/A 08/26/2018   Procedure: COLONOSCOPY WITH PROPOFOL ;  Surgeon: Jinny Carmine, MD;  Location: [facility];  Service: Endoscopy;  Laterality: N/A;   COLONOSCOPY WITH PROPOFOL  N/A 09/10/2023   Procedure: COLONOSCOPY WITH PROPOFOL ;  Surgeon: Jinny Carmine, MD;  Location: [facility];  Service: Endoscopy;  Laterality: N/A;   cyst removed from under left arm     KNEE ARTHROSCOPY     right    KNEE ARTHROSCOPY W/ ACL RECONSTRUCTION     LUMBAR LAMINECTOMY/DECOMPRESSION MICRODISCECTOMY  08/11/2012   Procedure: LUMBAR LAMINECTOMY/DECOMPRESSION MICRODISCECTOMY 1 LEVEL;  Surgeon: Lamar LELON Peaches, MD;  Location: [facility];  Service: Neurosurgery;  Laterality: Right;  RIGHT L45 laminotomy and microdiskectomy   LUMBAR LAMINECTOMY/DECOMPRESSION MICRODISCECTOMY Right 01/29/2015   Procedure: LUMBAR LAMINECTOMY/DECOMPRESSION MICRODISCECTOMY 1 LEVEL;  Surgeon: Lamar LELON Peaches, MD;  Location: [facility];   Service: Neurosurgery;  Laterality: Right;  Right L45 Laminotomy and microdisketomy   POLYPECTOMY  09/10/2023   Procedure: POLYPECTOMY;  Surgeon: Jinny Carmine, MD;  Location: [facility];  Service: Endoscopy;;   SHOULDER SURGERY     right    TONSILLECTOMY     Family History  Problem Relation Age of Onset   Hyperlipidemia Mother    Heart disease Mother    Congestive Heart Failure Mother    Colon cancer Father    Social History   Socioeconomic History   Marital status: Married    Spouse name: Asberry   Number of children: 1   Years of education: Not on file   Highest education level: Associate degree: occupational, Scientist, product/process development, or vocational program  Occupational History  Comment: retired   Occupation: retired Research officer, political party  Tobacco Use   Smoking status: Never   Smokeless tobacco: Never  Vaping Use   Vaping status: Never Used  Substance and Sexual Activity   Alcohol use: Yes    Alcohol/week: 15.0 standard drinks of alcohol    Types: 14 Glasses of wine, 1 Standard drinks or equivalent per week    Comment: 2 glasses of wine with dinner, 1 martini every Friday night   Drug use: No   Sexual activity: Yes  Other Topics Concern   Not on file  Social History Narrative   Married to [place], has 1 son that lives in [place]   Social Drivers of Health   Financial Resource Strain: Low Risk  (08/06/2024)   Overall Financial Resource Strain (CARDIA)    Difficulty of Paying Living Expenses: Not hard at all  Food Insecurity: No Food Insecurity (08/06/2024)   Hunger Vital Sign    Worried About Running Out of Food in the Last Year: Never true    Ran Out of Food in the Last Year: Never true  Transportation Needs: No Transportation Needs (08/06/2024)   PRAPARE - Administrator, Civil Service (Medical): No    Lack of Transportation (Non-Medical): No  Physical Activity: Sufficiently Active (08/06/2024)   Exercise Vital Sign    Days of Exercise per Week: 5 days     Minutes of Exercise per Session: 40 min  Stress: No Stress Concern Present (08/06/2024)   [employer] of Occupational Health - Occupational Stress Questionnaire    Feeling of Stress: Only a little  Social Connections: Socially Isolated (08/06/2024)   Social Connection and Isolation Panel    Frequency of Communication with Friends and Family: Once a week    Frequency of Social Gatherings with Friends and Family: Never    Attends Religious Services: Never    Diplomatic Services operational officer: No    Attends Engineer, structural: Never    Marital Status: Married    Tobacco Counseling Counseling given: Not Answered    Clinical Intake:  Pre-visit preparation completed: Yes  Pain : 0-10 Pain Score: 5  Pain Location: Back Pain Descriptors / Indicators: Nagging Pain Onset: More than a month ago Pain Frequency: Constant     BMI - recorded: 31.19 Nutritional Status: BMI > 30  Obese Nutritional Risks: None Diabetes: No  Lab Results  Component Value Date   HGBA1C 5.7 (H) 07/15/2024   HGBA1C 5.5 04/13/2021   HGBA1C 5.6 04/22/2018     How often do you need to have someone help you when you read instructions, pamphlets, or other written materials from your doctor or pharmacy?: 1 - Never  Interpreter Needed?: No  Information entered by :: R. Vester Titsworth LPN   Activities of Daily Living     08/10/2024   10:57 AM  In your present state of health, do you have any difficulty performing the following activities:  Hearing? 0  Vision? 0  Difficulty concentrating or making decisions? 0  Walking or climbing stairs? 0  Dressing or bathing? 0  Doing errands, shopping? 0  Preparing Food and eating ? N  Using the Toilet? N  In the past six months, have you accidently leaked urine? N  Do you have problems with loss of bowel control? N  Managing your Medications? N  Managing your Finances? N  Housekeeping or managing your Housekeeping? N    Patient Care Team: Marylynn Verneita CROME, MD as  PCP - General (Internal Medicine) Jinny Carmine, MD as Consulting Physician (Gastroenterology) Hester Alm BROCKS, MD (Dermatology) [facility], [facility] (Neurosurgery) Kathlynn Sharper, MD as Consulting Physician (Orthopedic Surgery)  I have updated your Care Teams any recent Medical Services you may have received from other providers in the past year.     Assessment:   This is a routine wellness examination for Ozzie.  Hearing/Vision screen Hearing Screening - Comments:: No issues Vision Screening - Comments:: Readers    Goals Addressed             This Visit's Progress    Patient Stated       Wants to lose some weight       Depression Screen     08/10/2024   11:05 AM 07/15/2024    9:49 AM 11/27/2023    8:26 AM 07/25/2023   10:49 AM 05/27/2023    8:10 AM 11/23/2022    9:08 AM 07/12/2022    9:15 AM  PHQ 2/9 Scores  PHQ - 2 Score 0 0 0 0 1 0 0  PHQ- 9 Score 1  0 0 1      Fall Risk     08/10/2024   10:59 AM 07/15/2024    9:49 AM 11/27/2023    8:26 AM 07/21/2023   11:10 AM 05/27/2023    8:09 AM  Fall Risk   Falls in the past year? 0 0 0 0 0  Number falls in past yr: 0 0 0 0 0  Injury with Fall? 0 0 0 0 0  Risk for fall due to : No Fall Risks No Fall Risks No Fall Risks No Fall Risks No Fall Risks  Follow up Falls evaluation completed;Falls prevention discussed Falls evaluation completed Falls evaluation completed Falls prevention discussed Falls evaluation completed    MEDICARE RISK AT HOME:  Medicare Risk at Home Any stairs in or around the home?: Yes If so, are there any without handrails?: No Home free of loose throw rugs in walkways, pet beds, electrical cords, etc?: Yes Adequate lighting in your home to reduce risk of falls?: Yes Life alert?: No Use of a cane, walker or w/c?: No Grab bars in the bathroom?: Yes Shower chair or bench in shower?: Yes Elevated toilet seat or a handicapped toilet?: No  TIMED UP AND GO:  Was  the test performed?  No  Cognitive Function: 6CIT completed    07/08/2020    8:49 AM  MMSE - Mini Mental State Exam  Not completed: Unable to complete        08/10/2024   11:12 AM 07/25/2023   10:52 AM  6CIT Screen  What Year? 0 points 0 points  What month? 0 points 0 points  What time? 0 points 0 points  Count back from 20 0 points 0 points  Months in reverse 0 points 0 points  Repeat phrase 0 points 0 points  Total Score 0 points 0 points    Immunizations Immunization History  Administered Date(s) Administered   INFLUENZA, HIGH DOSE SEASONAL PF 10/27/2018, 10/07/2019, 10/30/2023   Influenza,inj,Quad PF,6+ Mos 10/23/2017   Influenza-Unspecified 10/05/2020, 10/17/2021, 10/22/2022   PFIZER Comirnaty(Gray Top)Covid-19 Tri-Sucrose Vaccine 04/27/2021   PFIZER(Purple Top)SARS-COV-2 Vaccination 01/26/2020, 02/16/2020, 10/19/2020, 04/27/2021   Pfizer Covid-19 Vaccine Bivalent Booster 32yrs & up 09/29/2021   Pfizer(Comirnaty)Fall Seasonal Vaccine 12 years and older 11/24/2022   Pneumococcal Conjugate-13 04/24/2018   Pneumococcal Polysaccharide-23 07/14/2019   Tdap 04/24/2018    Screening Tests Health Maintenance  Topic Date Due   COVID-19 Vaccine (8 - 2024-25 season) 08/18/2023   Medicare Annual Wellness (AWV)  07/24/2024   INFLUENZA VACCINE  07/17/2024   Colonoscopy  09/15/2024 (Originally 09/09/2024)   Zoster Vaccines- Shingrix (1 of 2) 09/15/2024 (Originally 02/11/1970)   DTaP/Tdap/Td (2 - Td or Tdap) 04/24/2028   Pneumococcal Vaccine: 50+ Years  Completed   Hepatitis C Screening  Completed   HPV VACCINES  Aged Out   Meningococcal B Vaccine  Aged Out    Health Maintenance  Health Maintenance Due  Topic Date Due   COVID-19 Vaccine (8 - 2024-25 season) 08/18/2023   Medicare Annual Wellness (AWV)  07/24/2024   INFLUENZA VACCINE  07/17/2024   Health Maintenance Items Addressed: Discussed the need to update flu and covid vaccines. Patient stated that he has a  colonoscopy scheduled 9/2025Patient declines shingles vaccines.   Additional Screening:  Vision Screening: Recommended annual ophthalmology exams for early detection of glaucoma and other disorders of the eye. Up to date  Would you like a referral to an eye doctor? No    Dental Screening: Recommended annual dental exams for proper oral hygiene  Community Resource Referral / Chronic Care Management: CRR required this visit?  No   CCM required this visit?  No   Plan:    I have personally reviewed and noted the following in the patient's chart:   Medical and social history Use of alcohol, tobacco or illicit drugs  Current medications and supplements including opioid prescriptions. Patient is not currently taking opioid prescriptions. Functional ability and status Nutritional status Physical activity Advanced directives List of other physicians Hospitalizations, surgeries, and ER visits in previous 12 months Vitals Screenings to include cognitive, depression, and falls Referrals and appointments  In addition, I have reviewed and discussed with patient certain preventive protocols, quality metrics, and best practice recommendations. A written personalized care plan for preventive services as well as general preventive health recommendations were provided to patient.   Angeline Fredericks, LPN   [id number]   After Visit Summary: (MyChart) Due to this being a telephonic visit, the after visit summary with patients personalized plan was offered to patient via MyChart   Notes: Nothing significant to report at this time. Patient has an upcoming appointment scheduled with urologist Dr. Renda 11/2024 per patient

## 2024-08-10 NOTE — Patient Instructions (Signed)
 [name] , Thank you for taking time out of your busy schedule to complete your Annual Wellness Visit with me. I enjoyed our conversation and look forward to speaking with you again next year. I, as well as your care team,  appreciate your ongoing commitment to your health goals. Please review the following plan we discussed and let me know if I can assist you in the future. Your Game plan/ To Do List    Referrals: If you haven't heard from the office you've been referred to, please reach out to them at the phone provided.  Remember to update your flu and covid vaccines annually and consider the shingles vaccines.  Follow up Visits: We will see or speak with you next year for your Next Medicare AWV with our clinical staff 08/13/25 @ 9:30 Have you seen your provider in the last 6 months (3 months if uncontrolled diabetes)? Yes  Clinician Recommendations:  Aim for 30 minutes of exercise or brisk walking, 6-8 glasses of water , and 5 servings of fruits and vegetables each day.       This is a list of the screenings recommended for you:  Health Maintenance  Topic Date Due   COVID-19 Vaccine (8 - 2024-25 season) 08/18/2023   Flu Shot  07/17/2024   Colon Cancer Screening  09/15/2024*   Zoster (Shingles) Vaccine (1 of 2) 09/15/2024*   Medicare Annual Wellness Visit  08/10/2025   DTaP/Tdap/Td vaccine (2 - Td or Tdap) 04/24/2028   Pneumococcal Vaccine for age over 77  Completed   Hepatitis C Screening  Completed   HPV Vaccine  Aged Out   Meningitis B Vaccine  Aged Out  *Topic was postponed. The date shown is not the original due date.    Advanced directives: (Copy Requested) Please bring a copy of your health care power of attorney and living will to the office to be added to your chart at your convenience. You can mail to [facility] [address] [place], [place] [address] or email to [email or web address] Advance Care Planning is important because it:  [x]  Makes sure  you receive the medical care that is consistent with your values, goals, and preferences  [x]  It provides guidance to your family and loved ones and reduces their decisional burden about whether or not they are making the right decisions based on your wishes.

## 2024-09-10 ENCOUNTER — Ambulatory Visit: Admitting: Anesthesiology

## 2024-09-10 ENCOUNTER — Ambulatory Visit
Admission: RE | Admit: 2024-09-10 | Discharge: 2024-09-10 | Disposition: A | Discharge status: Home / Self Care | Attending: Gastroenterology | Admitting: Gastroenterology

## 2024-09-10 ENCOUNTER — Encounter: Payer: Self-pay | Admitting: Gastroenterology

## 2024-09-10 ENCOUNTER — Other Ambulatory Visit: Payer: Self-pay

## 2024-09-10 ENCOUNTER — Encounter
Admission: RE | Disposition: A | Discharge status: Home / Self Care | Payer: Self-pay | Source: Home / Self Care | Attending: Gastroenterology

## 2024-09-10 DIAGNOSIS — Z800 Family history of malignant neoplasm of digestive organs: Secondary | ICD-10-CM | POA: Diagnosis not present

## 2024-09-10 DIAGNOSIS — D124 Benign neoplasm of descending colon: Secondary | ICD-10-CM | POA: Insufficient documentation

## 2024-09-10 DIAGNOSIS — Z860100 Personal history of colon polyps, unspecified: Secondary | ICD-10-CM

## 2024-09-10 DIAGNOSIS — Z1211 Encounter for screening for malignant neoplasm of colon: Secondary | ICD-10-CM | POA: Diagnosis not present

## 2024-09-10 DIAGNOSIS — K635 Polyp of colon: Secondary | ICD-10-CM | POA: Diagnosis not present

## 2024-09-10 DIAGNOSIS — Z860101 Personal history of adenomatous and serrated colon polyps: Secondary | ICD-10-CM | POA: Diagnosis not present

## 2024-09-10 DIAGNOSIS — D122 Benign neoplasm of ascending colon: Secondary | ICD-10-CM | POA: Insufficient documentation

## 2024-09-10 HISTORY — PX: COLONOSCOPY: SHX5424

## 2024-09-10 HISTORY — PX: POLYPECTOMY: SHX149

## 2024-09-10 SURGERY — COLONOSCOPY
Anesthesia: General

## 2024-09-10 MED ORDER — LIDOCAINE HCL (CARDIAC) PF 100 MG/5ML IV SOSY
PREFILLED_SYRINGE | INTRAVENOUS | Status: DC | PRN
  Administered 2024-09-10: 100 mg via INTRAVENOUS

## 2024-09-10 MED ORDER — SODIUM CHLORIDE 0.9 % IV SOLN: INTRAVENOUS | Status: DC

## 2024-09-10 MED ORDER — LIDOCAINE HCL (PF) 2 % IJ SOLN
INTRAMUSCULAR | Status: AC
  Filled 2024-09-10: qty 5

## 2024-09-10 MED ORDER — DEXMEDETOMIDINE HCL IN NACL 80 MCG/20ML IV SOLN
INTRAVENOUS | Status: DC | PRN
  Administered 2024-09-10: 8 ug via INTRAVENOUS
  Administered 2024-09-10: 12 ug via INTRAVENOUS

## 2024-09-10 MED ORDER — PROPOFOL 10 MG/ML IV BOLUS
INTRAVENOUS | Status: DC | PRN
Start: 2024-09-10 — End: 2024-09-10
  Administered 2024-09-10 (×2): 50 mg via INTRAVENOUS

## 2024-09-10 MED ORDER — PROPOFOL 500 MG/50ML IV EMUL
INTRAVENOUS | Status: DC | PRN
  Administered 2024-09-10: 75 ug/kg/min via INTRAVENOUS

## 2024-09-10 NOTE — Anesthesia Postprocedure Evaluation (Signed)
 Anesthesia Post Note  Patient: Ronald Hunter  Procedure(s) Performed: COLONOSCOPY POLYPECTOMY, INTESTINE  Patient location during evaluation: Endoscopy Anesthesia Type: General Level of consciousness: awake and alert Pain management: pain level controlled Vital Signs Assessment: post-procedure vital signs reviewed and stable Respiratory status: spontaneous breathing, nonlabored ventilation and respiratory function stable Cardiovascular status: blood pressure returned to baseline and stable Postop Assessment: no apparent nausea or vomiting Anesthetic complications: no   No notable events documented.   Last Vitals:  Vitals:   09/10/24 0845 09/10/24 0855  BP: (!) 126/96 135/79  Pulse: 65 (!) 59  Resp: 12 17  Temp:    SpO2: 97% 100%    Last Pain:  Vitals:   09/10/24 0855  TempSrc:   PainSc: 0-No pain                 Fairy POUR Stanford Strauch

## 2024-09-10 NOTE — Anesthesia Preprocedure Evaluation (Signed)
 Anesthesia Evaluation  Patient identified by MRN, date of birth, ID band Patient awake    Reviewed: Allergy & Precautions, NPO status , Patient's Chart, lab work & pertinent test results  History of Anesthesia Complications (+) PONV and history of anesthetic complications  Airway Mallampati: III  TM Distance: <3 FB Neck ROM: full    Dental  (+) Chipped   Pulmonary neg pulmonary ROS, neg shortness of breath   Pulmonary exam normal        Cardiovascular Exercise Tolerance: Good hypertension, Normal cardiovascular exam     Neuro/Psych negative neurological ROS  negative psych ROS   GI/Hepatic negative GI ROS, Neg liver ROS,neg GERD  ,,  Endo/Other  negative endocrine ROS    Renal/GU negative Renal ROS  negative genitourinary   Musculoskeletal   Abdominal   Peds  Hematology negative hematology ROS (+)   Anesthesia Other Findings Past Medical History: No date: Anxiety No date: Arthritis No date: Chronic back pain     Comment:  stenosis/spondylosis 11/15/2021: Dysplastic nevus     Comment:  L mid to low back paraspinal, 2.0 cm lat to spine,               Severe, Excised 01/16/22 11/15/2021: Dysplastic nevus     Comment:  L mid to low back lateral, 11.0cm lat to spinal,               moderate atypia 01/23/2022: Dysplastic nevus     Comment:  R infrascapular - mod to severe, Excised 03/13/22 01/23/2022: Dysplastic nevus     Comment:  L mid back 7.0 cm lat to spine and inf to L mid back 6.0              cm lat to spine - severe, Excised 01/30/22 01/23/2022: Dysplastic nevus     Comment:  L mid back 6.0 cm lat to spine - moderate 02/17/2022: Dysplastic nevus     Comment:  mid back spinal sup - mod 02/17/2022: Dysplastic nevus     Comment:  mid back spinal inf - mod 02/17/2022: Dysplastic nevus     Comment:  right mid back 12.5 cm lat to spine inf - mod to severe 02/17/2022: Dysplastic nevus     Comment:  right  mid back 12.5 cm lat to spine sup - severe - Shave              removal 04/10/2022 11/21/2022: Dysplastic nevus     Comment:  right lat back near side - moderate 05/30/2023: Dysplastic nevus     Comment:  left low back 7 cm to the spine - moderate 05/30/2023: Dysplastic nevus     Comment:  L mid back infra scapular - moderate No date: History of bronchitis     Comment:  as a child No date: History of colon polyps No date: History of kidney stones No date: Hyperlipidemia     Comment:  takes Crestor  daily No date: Hypertension     Comment:  takes Metoprolol  daily No date: PONV (postoperative nausea and vomiting)     Comment:  Nausea with one surgery  Past Surgical History: No date: COLONOSCOPY No date: COLONOSCOPY     Comment:  several No date: COLONOSCOPY W/ POLYPECTOMY 08/26/2018: COLONOSCOPY WITH PROPOFOL ; N/A     Comment:  Procedure: COLONOSCOPY WITH PROPOFOL ;  Surgeon: Jinny Carmine, MD;  Location: [facility];  Service:  Endoscopy;  Laterality: N/A; 09/10/2023: COLONOSCOPY WITH PROPOFOL ; N/A     Comment:  Procedure: COLONOSCOPY WITH PROPOFOL ;  Surgeon: Jinny Carmine, MD;  Location: [facility];  Service:               Endoscopy;  Laterality: N/A; No date: cyst removed from under left arm No date: KNEE ARTHROSCOPY     Comment:  right  No date: KNEE ARTHROSCOPY W/ ACL RECONSTRUCTION 08/11/2012: LUMBAR LAMINECTOMY/DECOMPRESSION MICRODISCECTOMY     Comment:  Procedure: LUMBAR LAMINECTOMY/DECOMPRESSION               MICRODISCECTOMY 1 LEVEL;  Surgeon: Lamar LELON Peaches, MD;              Location: [facility];  Service: Neurosurgery;                Laterality: Right;  RIGHT L45 laminotomy and               microdiskectomy 01/29/2015: LUMBAR LAMINECTOMY/DECOMPRESSION MICRODISCECTOMY; Right     Comment:  Procedure: LUMBAR LAMINECTOMY/DECOMPRESSION               MICRODISCECTOMY 1 LEVEL;  Surgeon: Lamar LELON Peaches, MD;               Location: [facility];  Service: Neurosurgery;                Laterality: Right;  Right L45 Laminotomy and               microdisketomy 09/10/2023: POLYPECTOMY     Comment:  Procedure: POLYPECTOMY;  Surgeon: Jinny Carmine, MD;                Location: [facility];  Service: Endoscopy;; No date: SHOULDER SURGERY     Comment:  right  No date: TONSILLECTOMY  BMI    Body Mass Index: 30.65 kg/m      Reproductive/Obstetrics negative OB ROS                              Anesthesia Physical Anesthesia Plan  ASA: 2  Anesthesia Plan: General   Post-op Pain Management:    Induction: Intravenous  PONV Risk Score and Plan: Propofol  infusion and TIVA  Airway Management Planned: Natural Airway and Nasal Cannula  Additional Equipment:   Intra-op Plan:   Post-operative Plan:   Informed Consent: I have reviewed the patients History and Physical, chart, labs and discussed the procedure including the risks, benefits and alternatives for the proposed anesthesia with the patient or authorized representative who has indicated his/her understanding and acceptance.     Dental Advisory Given  Plan Discussed with: Anesthesiologist, CRNA and Surgeon  Anesthesia Plan Comments: (Patient consented for risks of anesthesia including but not limited to:  - adverse reactions to medications - risk of airway placement if required - damage to eyes, teeth, lips or other oral mucosa - nerve damage due to positioning  - sore throat or hoarseness - Damage to heart, brain, nerves, lungs, other parts of body or loss of life  Patient voiced understanding and assent.)        Anesthesia Quick Evaluation

## 2024-09-10 NOTE — Op Note (Signed)
 [facility] Patient Name: Ronald Hunter Procedure Date: 09/10/2024 8:07 AM MRN: [id number] Account #: [id number] Date of Birth: [birth year] Admit Type: Outpatient Age: 73 Room: [facility] ENDO ROOM 3 Gender: Male Note Status: Finalized Instrument Name: Colon Scope [id number] Procedure:             Colonoscopy Indications:           High risk colon cancer surveillance: Personal history                         of colonic polyps Providers:             Rogelia Copping MD, MD Medicines:             Propofol  per Anesthesia Complications:         No immediate complications. Procedure:             Pre-Anesthesia Assessment:                        - Prior to the procedure, a History and Physical was                         performed, and patient medications and allergies were                         reviewed. The patient's tolerance of previous                         anesthesia was also reviewed. The risks and benefits                         of the procedure and the sedation options and risks                         were discussed with the patient. All questions were                         answered, and informed consent was obtained. Prior                         Anticoagulants: The patient has taken no anticoagulant                         or antiplatelet agents. ASA Grade Assessment: II - A                         patient with mild systemic disease. After reviewing                         the risks and benefits, the patient was deemed in                         satisfactory condition to undergo the procedure.                        After obtaining informed consent, the colonoscope was                         passed under direct vision.  Throughout the procedure,                         the patient's blood pressure, pulse, and oxygen                         saturations were monitored continuously. The                         Colonoscope was introduced through the anus  and                         advanced to the the cecum, identified by appendiceal                         orifice and ileocecal valve. The colonoscopy was                         performed without difficulty. The patient tolerated                         the procedure well. The quality of the bowel                         preparation was excellent. Findings:      The perianal and digital rectal examinations were normal.      Three sessile polyps were found in the ascending colon. The polyps were       3 to 4 mm in size. These polyps were removed with a cold snare.       Resection and retrieval were complete.      Two sessile polyps were found in the descending colon. The polyps were 4       to 6 mm in size. These polyps were removed with a cold snare. Resection       and retrieval were complete. Impression:            - Three 3 to 4 mm polyps in the ascending colon,                         removed with a cold snare. Resected and retrieved.                        - Two 4 to 6 mm polyps in the descending colon,                         removed with a cold snare. Resected and retrieved. Recommendation:        - Discharge patient to home.                        - Resume previous diet.                        - Continue present medications.                        - Await pathology results.                        - Repeat colonoscopy in 5  years for surveillance. Procedure Code(s):     --- Professional ---                        [phone], Colonoscopy, flexible; with removal of                         tumor(s), polyp(s), or other lesion(s) by snare                         technique Diagnosis Code(s):     --- Professional ---                        Z86.010, Personal history of colonic polyps                        D12.4, Benign neoplasm of descending colon CPT copyright 2022 American Medical Association. All rights reserved. The codes documented in this report are preliminary and upon coder review  may  be revised to meet current compliance requirements. Rogelia Copping MD, MD 09/10/2024 8:33:27 AM This report has been signed electronically. Number of Addenda: 0 Note Initiated On: 09/10/2024 8:07 AM Scope Withdrawal Time: 0 hours 7 minutes 47 seconds  Total Procedure Duration: 0 hours 15 minutes 57 seconds  Estimated Blood Loss:  Estimated blood loss: none.      [facility]

## 2024-09-10 NOTE — Transfer of Care (Signed)
 Immediate Anesthesia Transfer of Care Note  Patient: Ronald Hunter  Procedure(s) Performed: COLONOSCOPY POLYPECTOMY, INTESTINE  Patient Location: PACU  Anesthesia Type:General  Level of Consciousness: sedated  Airway & Oxygen Therapy: Patient Spontanous Breathing  Post-op Assessment: Report given to RN and Post -op Vital signs reviewed and stable  Post vital signs: Reviewed and stable  Last Vitals:  Vitals Value Taken Time  BP 102/64 09/10/24 08:35  Temp 35.8 C 09/10/24 08:35  Pulse 61 09/10/24 08:36  Resp 19 09/10/24 08:36  SpO2 96 % 09/10/24 08:36  Vitals shown include unfiled device data.  Last Pain:  Vitals:   09/10/24 0835  TempSrc: Temporal  PainSc: Asleep         Complications: No notable events documented.

## 2024-09-10 NOTE — H&P (Signed)
 Ronald Copping, MD [facility] [address], [address] [place], [place] [address] [phone] Fax : [phone]  Primary Care Physician:  Marylynn Verneita CROME, MD Primary Gastroenterologist:  Dr. Copping  Pre-Procedure History & Physical: HPI:  Ronald Hunter is a 73 y.o. male is here for an colonoscopy.   Past Medical History:  Diagnosis Date   Anxiety    Arthritis    Chronic back pain    stenosis/spondylosis   Dysplastic nevus 11/15/2021   L mid to low back paraspinal, 2.0 cm lat to spine, Severe, Excised 01/16/22   Dysplastic nevus 11/15/2021   L mid to low back lateral, 11.0cm lat to spinal, moderate atypia   Dysplastic nevus 01/23/2022   R infrascapular - mod to severe, Excised 03/13/22   Dysplastic nevus 01/23/2022   L mid back 7.0 cm lat to spine and inf to L mid back 6.0 cm lat to spine - severe, Excised 01/30/22   Dysplastic nevus 01/23/2022   L mid back 6.0 cm lat to spine - moderate   Dysplastic nevus 02/17/2022   mid back spinal sup - mod   Dysplastic nevus 02/17/2022   mid back spinal inf - mod   Dysplastic nevus 02/17/2022   right mid back 12.5 cm lat to spine inf - mod to severe   Dysplastic nevus 02/17/2022   right mid back 12.5 cm lat to spine sup - severe - Shave removal 04/10/2022   Dysplastic nevus 11/21/2022   right lat back near side - moderate   Dysplastic nevus 05/30/2023   left low back 7 cm to the spine - moderate   Dysplastic nevus 05/30/2023   L mid back infra scapular - moderate   History of bronchitis    as a child   History of colon polyps    History of kidney stones    Hyperlipidemia    takes Crestor  daily   Hypertension    takes Metoprolol  daily   PONV (postoperative nausea and vomiting)    Nausea with one surgery    Past Surgical History:  Procedure Laterality Date   COLONOSCOPY     COLONOSCOPY     several   COLONOSCOPY W/ POLYPECTOMY     COLONOSCOPY WITH PROPOFOL  N/A 08/26/2018   Procedure: COLONOSCOPY WITH PROPOFOL ;  Surgeon: Hunter Rogelia, MD;  Location: [facility] ENDOSCOPY;  Service: Endoscopy;  Laterality: N/A;   COLONOSCOPY WITH PROPOFOL  N/A 09/10/2023   Procedure: COLONOSCOPY WITH PROPOFOL ;  Surgeon: Hunter Rogelia, MD;  Location: [facility] ENDOSCOPY;  Service: Endoscopy;  Laterality: N/A;   cyst removed from under left arm     KNEE ARTHROSCOPY     right    KNEE ARTHROSCOPY W/ ACL RECONSTRUCTION     LUMBAR LAMINECTOMY/DECOMPRESSION MICRODISCECTOMY  08/11/2012   Procedure: LUMBAR LAMINECTOMY/DECOMPRESSION MICRODISCECTOMY 1 LEVEL;  Surgeon: Lamar LELON Peaches, MD;  Location: [facility] NEURO ORS;  Service: Neurosurgery;  Laterality: Right;  RIGHT L45 laminotomy and microdiskectomy   LUMBAR LAMINECTOMY/DECOMPRESSION MICRODISCECTOMY Right 01/29/2015   Procedure: LUMBAR LAMINECTOMY/DECOMPRESSION MICRODISCECTOMY 1 LEVEL;  Surgeon: Lamar LELON Peaches, MD;  Location: [facility] NEURO ORS;  Service: Neurosurgery;  Laterality: Right;  Right L45 Laminotomy and microdisketomy   POLYPECTOMY  09/10/2023   Procedure: POLYPECTOMY;  Surgeon: Hunter Rogelia, MD;  Location: [facility] ENDOSCOPY;  Service: Endoscopy;;   SHOULDER SURGERY     right    TONSILLECTOMY      Prior to Admission medications   Medication Sig Start Date End Date Taking? Authorizing Provider  aspirin  500 MG tablet Take  500 mg by mouth every 6 (six) hours as needed for pain.   Yes [provider]  aspirin  81 MG tablet Take 81 mg by mouth daily.   Yes [provider]  gabapentin  (NEURONTIN ) 100 MG capsule Take 1 capsule (100 mg total) by mouth at bedtime as needed. 07/15/24  Yes Marylynn Verneita CROME, MD  lisinopril  (ZESTRIL ) 40 MG tablet Take 1 tablet (40 mg total) by mouth daily. 07/16/24  Yes Tullo, Teresa L, MD  Multiple Vitamin (MULTIVITAMIN WITH MINERALS) TABS Take 1 tablet by mouth daily.   Yes [provider]  rosuvastatin  (CRESTOR ) 40 MG tablet Take 1 tablet (40 mg total) by mouth daily. 07/15/24  Yes Marylynn Verneita CROME, MD  hydrocortisone  2.5 % cream Apply topically 3 (three)  times a week. Apply to scaly areas on face 3 nights weekly, Tuesday, Thursday and Saturdays 11/15/21   Hester Alm BROCKS, MD    Allergies as of 05/21/2024   (No Known Allergies)    Family History  Problem Relation Age of Onset   Hyperlipidemia Mother    Heart disease Mother    Congestive Heart Failure Mother    Colon cancer Father     Social History   Socioeconomic History   Marital status: Married    Spouse name: Asberry   Number of children: 1   Years of education: Not on file   Highest education level: Associate degree: occupational, Scientist, product/process development, or vocational program  Occupational History    Comment: retired   Occupation: retired Research officer, political party  Tobacco Use   Smoking status: Never   Smokeless tobacco: Never  Vaping Use   Vaping status: Never Used  Substance and Sexual Activity   Alcohol use: Yes    Alcohol/week: 15.0 standard drinks of alcohol    Types: 14 Glasses of wine, 1 Standard drinks or equivalent per week    Comment: 2 glasses of wine with dinner, 1 martini every Friday night   Drug use: No   Sexual activity: Yes  Other Topics Concern   Not on file  Social History Narrative   Married to [place], has 1 son that lives in [place]   Social Drivers of Health   Financial Resource Strain: Low Risk  (08/06/2024)   Overall Financial Resource Strain (CARDIA)    Difficulty of Paying Living Expenses: Not hard at all  Food Insecurity: No Food Insecurity (08/06/2024)   Hunger Vital Sign    Worried About Running Out of Food in the Last Year: Never true    Ran Out of Food in the Last Year: Never true  Transportation Needs: No Transportation Needs (08/06/2024)   PRAPARE - Administrator, Civil Service (Medical): No    Lack of Transportation (Non-Medical): No  Physical Activity: Sufficiently Active (08/06/2024)   Exercise Vital Sign    Days of Exercise per Week: 5 days    Minutes of Exercise per Session: 40 min  Stress: No Stress Concern Present  (08/06/2024)   [employer] of Occupational Health - Occupational Stress Questionnaire    Feeling of Stress: Only a little  Social Connections: Socially Isolated (08/06/2024)   Social Connection and Isolation Panel    Frequency of Communication with Friends and Family: Once a week    Frequency of Social Gatherings with Friends and Family: Never    Attends Religious Services: Never    Database administrator or Organizations: No    Attends Banker Meetings: Never    Marital Status: Married  Intimate Partner Violence: Not At Risk (08/10/2024)   Humiliation, Afraid, Rape, and Kick questionnaire    Fear of Current or Ex-Partner: No    Emotionally Abused: No    Physically Abused: No    Sexually Abused: No    Review of Systems: See HPI, otherwise negative ROS  Physical Exam: BP (!) 165/93   Pulse 71   Temp (!) 96.4 F (35.8 C) (Temporal)   Resp 16   Ht 6' (1.829 m)   Wt 102.5 kg   SpO2 99%   BMI 30.65 kg/m  General:   Alert,  pleasant and cooperative in NAD Head:  Normocephalic and atraumatic. Neck:  Supple; no masses or thyromegaly. Lungs:  Clear throughout to auscultation.    Heart:  Regular rate and rhythm. Abdomen:  Soft, nontender and nondistended. Normal bowel sounds, without guarding, and without rebound.   Neurologic:  Alert and  oriented x4;  grossly normal neurologically.  Impression/Plan: Ronald Hunter is here for an colonoscopy to be performed for a history of adenomatous polyps on 2024   Risks, benefits, limitations, and alternatives regarding  colonoscopy have been reviewed with the patient.  Questions have been answered.  All parties agreeable.   Ronald Copping, MD  09/10/2024, 7:29 AM

## 2024-09-11 LAB — SURGICAL PATHOLOGY

## 2024-09-13 ENCOUNTER — Ambulatory Visit: Payer: Self-pay | Admitting: Gastroenterology

## 2024-10-07 ENCOUNTER — Other Ambulatory Visit: Payer: Self-pay | Admitting: Internal Medicine

## 2024-10-07 DIAGNOSIS — G8929 Other chronic pain: Secondary | ICD-10-CM

## 2024-10-28 DIAGNOSIS — R972 Elevated prostate specific antigen [PSA]: Secondary | ICD-10-CM | POA: Diagnosis not present

## 2024-11-03 ENCOUNTER — Other Ambulatory Visit: Payer: Self-pay | Admitting: Urology

## 2024-11-03 DIAGNOSIS — R972 Elevated prostate specific antigen [PSA]: Secondary | ICD-10-CM

## 2024-11-17 ENCOUNTER — Ambulatory Visit
Admission: RE | Admit: 2024-11-17 | Discharge: 2024-11-17 | Disposition: A | Source: Ambulatory Visit | Attending: Urology | Admitting: Urology

## 2024-11-17 DIAGNOSIS — R972 Elevated prostate specific antigen [PSA]: Secondary | ICD-10-CM

## 2024-11-17 MED ORDER — GADOPICLENOL 0.5 MMOL/ML IV SOLN
10.0000 mL | Freq: Once | INTRAVENOUS | Status: AC | PRN
  Administered 2024-11-17: 10 mL via INTRAVENOUS

## 2024-12-31 ENCOUNTER — Other Ambulatory Visit: Payer: Self-pay | Admitting: Urology

## 2024-12-31 DIAGNOSIS — C61 Malignant neoplasm of prostate: Secondary | ICD-10-CM

## 2025-01-04 ENCOUNTER — Ambulatory Visit
Admission: RE | Admit: 2025-01-04 | Discharge: 2025-01-04 | Disposition: A | Discharge status: Home / Self Care | Source: Ambulatory Visit | Attending: Urology | Admitting: Urology

## 2025-01-04 DIAGNOSIS — I2510 Atherosclerotic heart disease of native coronary artery without angina pectoris: Secondary | ICD-10-CM | POA: Insufficient documentation

## 2025-01-04 DIAGNOSIS — C61 Malignant neoplasm of prostate: Secondary | ICD-10-CM | POA: Insufficient documentation

## 2025-01-04 DIAGNOSIS — I700 Atherosclerosis of aorta: Secondary | ICD-10-CM | POA: Insufficient documentation

## 2025-01-04 MED ORDER — FLOTUFOLASTAT F 18 GALLIUM 296-5846 MBQ/ML IV SOLN
8.0000 | Freq: Once | INTRAVENOUS | Status: AC
  Administered 2025-01-04: 8.28 via INTRAVENOUS

## 2025-01-05 ENCOUNTER — Other Ambulatory Visit: Payer: Self-pay | Admitting: Internal Medicine

## 2025-01-05 DIAGNOSIS — I10 Essential (primary) hypertension: Secondary | ICD-10-CM

## 2025-01-06 ENCOUNTER — Encounter: Payer: Self-pay | Admitting: Radiation Oncology

## 2025-01-06 NOTE — Progress Notes (Signed)
 GU Location of Tumor / Histology:  Prostate Ca  If Prostate Cancer, Gleason Score is (3 + 4) and PSA is (6.5 on 10/28/2024)  Norleen FORBES Hoit presented as referral from Dr. Gretel Ferrara [facility]) elevated PSA.  Biopsies     01/04/2025 Dr. Gretel Ferrara NM PET (PSMA) Skull to Mid Thigh CLINICAL DATA:  Prostate cancer with rising PSA   IMPRESSION: 1. Bilateral foci of tracer affinity within the prostate are suspicious for small volume primaries. 2. Diminutive abdominal nodes, unchanged in size back to 2013 with low-level tracer affinity. Favored to be benign/reactive. 3. A small mediastinal node with tracer affinity is favored to be benign/reactive. 4. Incidental findings, including: Coronary artery atherosclerosis. Aortic Atherosclerosis (ICD10-I70.0).   11/17/2024 Dr. Gretel Ferrara MR Prostate with/without Contrast CLINICAL DATA:  Elevated prostate specific antigen   IMPRESSION: 1. Left posteromedial and left posterolateral peripheral zone base-to-mid gland lesion with central zone base and left posterior transition zone involvement, PI-RADS 5, clinically significant cancer is highly likely, with mild capsular bulging and haziness suspicious for transcapsular spread of tumor, no definite neurovascular bundle or seminal vesicle invasion. 2. Benign prostatic hypertrophy with encapsulated nodularity in the transition zone. 3. Prostate volume 40.4 cubic centimeters. 4. Small right scrotal hydrocele.    Past/Anticipated interventions by urology, if any:  Dr. Gretel Ferrara    Past/Anticipated interventions by medical oncology, if any: NA  Weight changes, if any: {:18581}  IPSS: SHIM:  Bowel/Bladder complaints, if any: {:18581}   Nausea/Vomiting, if any: {:18581}  Pain issues, if any:  {:18581}  SAFETY ISSUES: Prior radiation? {:18581} Pacemaker/ICD? {:18581} Possible current pregnancy?  Male Is the patient on methotrexate? No  Current  Complaints / other details:    30 minutes spent total, including time for meaningful use questions, reviewing medication, as well as spent in face-to-face time in nurse evaluation with the patient

## 2025-01-07 ENCOUNTER — Ambulatory Visit
Admission: RE | Admit: 2025-01-07 | Discharge: 2025-01-07 | Disposition: A | Discharge status: Home / Self Care | Source: Ambulatory Visit | Attending: Radiation Oncology | Admitting: Radiation Oncology

## 2025-01-07 ENCOUNTER — Encounter: Payer: Self-pay | Admitting: Radiation Oncology

## 2025-01-07 VITALS — BP 181/84 | HR 68 | Temp 97.5°F | Resp 20 | Ht 72.0 in | Wt 240.8 lb

## 2025-01-07 DIAGNOSIS — Z86018 Personal history of other benign neoplasm: Secondary | ICD-10-CM | POA: Insufficient documentation

## 2025-01-07 DIAGNOSIS — E785 Hyperlipidemia, unspecified: Secondary | ICD-10-CM | POA: Diagnosis not present

## 2025-01-07 DIAGNOSIS — I6523 Occlusion and stenosis of bilateral carotid arteries: Secondary | ICD-10-CM | POA: Insufficient documentation

## 2025-01-07 DIAGNOSIS — Z79899 Other long term (current) drug therapy: Secondary | ICD-10-CM | POA: Diagnosis not present

## 2025-01-07 DIAGNOSIS — I119 Hypertensive heart disease without heart failure: Secondary | ICD-10-CM | POA: Insufficient documentation

## 2025-01-07 DIAGNOSIS — M549 Dorsalgia, unspecified: Secondary | ICD-10-CM | POA: Insufficient documentation

## 2025-01-07 DIAGNOSIS — Z800 Family history of malignant neoplasm of digestive organs: Secondary | ICD-10-CM | POA: Diagnosis not present

## 2025-01-07 DIAGNOSIS — Z87442 Personal history of urinary calculi: Secondary | ICD-10-CM | POA: Insufficient documentation

## 2025-01-07 DIAGNOSIS — C61 Malignant neoplasm of prostate: Secondary | ICD-10-CM | POA: Diagnosis present

## 2025-01-07 DIAGNOSIS — Z860101 Personal history of adenomatous and serrated colon polyps: Secondary | ICD-10-CM | POA: Insufficient documentation

## 2025-01-07 DIAGNOSIS — I2510 Atherosclerotic heart disease of native coronary artery without angina pectoris: Secondary | ICD-10-CM | POA: Diagnosis not present

## 2025-01-07 DIAGNOSIS — Z7982 Long term (current) use of aspirin: Secondary | ICD-10-CM | POA: Insufficient documentation

## 2025-01-07 DIAGNOSIS — I700 Atherosclerosis of aorta: Secondary | ICD-10-CM | POA: Insufficient documentation

## 2025-01-07 DIAGNOSIS — M129 Arthropathy, unspecified: Secondary | ICD-10-CM | POA: Diagnosis not present

## 2025-01-07 HISTORY — DX: Elevated prostate specific antigen (PSA): R97.20

## 2025-01-07 HISTORY — DX: Malignant neoplasm of prostate: C61

## 2025-01-07 NOTE — Progress Notes (Signed)
 " Radiation Oncology         [phone] ________________________________  Initial Outpatient Consultation  Name: Ronald Hunter MRN: [id number]  Date: 01/07/2025  DOB: 12-16-1951  RR:Uloon, Verneita CROME, MD  Renda Glance, MD   REFERRING PHYSICIAN: Renda Glance, MD  DIAGNOSIS: 74 y.o. gentleman with Stage T3a adenocarcinoma of the prostate with Gleason score of 3+4, and PSA of 6.5.    ICD-10-CM   1. Malignant neoplasm of prostate (HCC)  C61       HISTORY OF PRESENT ILLNESS: Ronald Hunter is a 74 y.o. male with a diagnosis of prostate cancer. He was noted to have an elevated PSA of 4.8 by his primary care physician, Dr. Marylynn.  Accordingly, he was referred for evaluation in urology by Dr. Renda on 10/28/24,  digital rectal examination performed at that time showed diffuse firmness bilaterally but left greater than right.  A prostate MRI was performed on 11/17/2024 and revealed a PI-RADS 5 lesion in the left posterior medial and posterior lateral peripheral zone extending from base to mid gland with capsular bulging and haziness, suspicious for extracapsular extension.  The patient proceeded to MRI fusion transrectal ultrasound with 16 biopsies of the prostate on 12/07/2024.  The prostate volume measured 33.5 cc.  Out of 16 core biopsies, 8 were positive, all on the left.  The maximum Gleason score was 3+4, and this was seen in all 4 cores from the MRI ROI as well as the left mid.  Additionally, Gleason 3+3 was seen in the left mid lateral, left base and left base lateral.  A PSMA PET scan was performed on 01/04/2025 and was without any evidence of metastatic disease.  There were tiny abdominal lymph nodes that appear stable since 2013 and a small mediastinal node that is favored benign.  Incidentally noted is coronary artery calcifications and aortic atherosclerosis which may increase his risks of cardiac events so we will share this information with his PCP so that they can implement any necessary  measures to decrease cardiac risk.   The patient reviewed the biopsy and imaging results with his urologist and he has kindly been referred today for discussion of potential radiation treatment options. He is accompanied by his wife, Ronald Hunter, for today's visit.   PREVIOUS RADIATION THERAPY: No  PAST MEDICAL HISTORY:  Past Medical History:  Diagnosis Date   Anxiety    Arthritis    Chronic back pain    stenosis/spondylosis   Dysplastic nevus 11/15/2021   L mid to low back paraspinal, 2.0 cm lat to spine, Severe, Excised 01/16/22   Dysplastic nevus 11/15/2021   L mid to low back lateral, 11.0cm lat to spinal, moderate atypia   Dysplastic nevus 01/23/2022   R infrascapular - mod to severe, Excised 03/13/22   Dysplastic nevus 01/23/2022   L mid back 7.0 cm lat to spine and inf to L mid back 6.0 cm lat to spine - severe, Excised 01/30/22   Dysplastic nevus 01/23/2022   L mid back 6.0 cm lat to spine - moderate   Dysplastic nevus 02/17/2022   mid back spinal sup - mod   Dysplastic nevus 02/17/2022   mid back spinal inf - mod   Dysplastic nevus 02/17/2022   right mid back 12.5 cm lat to spine inf - mod to severe   Dysplastic nevus 02/17/2022   right mid back 12.5 cm lat to spine sup - severe - Shave removal 04/10/2022   Dysplastic nevus 11/21/2022   right lat  back near side - moderate   Dysplastic nevus 05/30/2023   left low back 7 cm to the spine - moderate   Dysplastic nevus 05/30/2023   L mid back infra scapular - moderate   Elevated PSA    History of bronchitis    as a child   History of colon polyps    History of kidney stones    Hyperlipidemia    takes Crestor  daily   Hypertension    takes Metoprolol  daily   PONV (postoperative nausea and vomiting)    Nausea with one surgery   Prostate cancer (HCC)       PAST SURGICAL HISTORY: Past Surgical History:  Procedure Laterality Date   COLONOSCOPY     COLONOSCOPY     several   COLONOSCOPY N/A 09/10/2024   Procedure:  COLONOSCOPY;  Surgeon: Jinny Carmine, MD;  Location: [facility];  Service: Endoscopy;  Laterality: N/A;   COLONOSCOPY W/ POLYPECTOMY     COLONOSCOPY WITH PROPOFOL  N/A 08/26/2018   Procedure: COLONOSCOPY WITH PROPOFOL ;  Surgeon: Jinny Carmine, MD;  Location: [facility];  Service: Endoscopy;  Laterality: N/A;   COLONOSCOPY WITH PROPOFOL  N/A 09/10/2023   Procedure: COLONOSCOPY WITH PROPOFOL ;  Surgeon: Jinny Carmine, MD;  Location: [facility];  Service: Endoscopy;  Laterality: N/A;   cyst removed from under left arm     KNEE ARTHROSCOPY     right    KNEE ARTHROSCOPY W/ ACL RECONSTRUCTION     LUMBAR LAMINECTOMY/DECOMPRESSION MICRODISCECTOMY  08/11/2012   Procedure: LUMBAR LAMINECTOMY/DECOMPRESSION MICRODISCECTOMY 1 LEVEL;  Surgeon: Lamar LELON Peaches, MD;  Location: [facility];  Service: Neurosurgery;  Laterality: Right;  RIGHT L45 laminotomy and microdiskectomy   LUMBAR LAMINECTOMY/DECOMPRESSION MICRODISCECTOMY Right 01/29/2015   Procedure: LUMBAR LAMINECTOMY/DECOMPRESSION MICRODISCECTOMY 1 LEVEL;  Surgeon: Lamar LELON Peaches, MD;  Location: [facility];  Service: Neurosurgery;  Laterality: Right;  Right L45 Laminotomy and microdisketomy   POLYPECTOMY  09/10/2023   Procedure: POLYPECTOMY;  Surgeon: Jinny Carmine, MD;  Location: [facility];  Service: Endoscopy;;   POLYPECTOMY  09/10/2024   Procedure: POLYPECTOMY, INTESTINE;  Surgeon: Jinny Carmine, MD;  Location: [facility];  Service: Endoscopy;;   SHOULDER SURGERY     right    TONSILLECTOMY      FAMILY HISTORY:  Family History  Problem Relation Age of Onset   Hyperlipidemia Mother    Heart disease Mother    Congestive Heart Failure Mother    COPD Mother    Colon cancer Father    Cancer Father     SOCIAL HISTORY:  Social History   Socioeconomic History   Marital status: Married    Spouse name: Asberry   Number of children: 1   Years of education: Not on file   Highest education level: Associate degree: occupational,  scientist, product/process development, or vocational program  Occupational History    Comment: retired   Occupation: retired research officer, political party  Tobacco Use   Smoking status: Never   Smokeless tobacco: Never  Vaping Use   Vaping status: Never Used  Substance and Sexual Activity   Alcohol use: Yes    Alcohol/week: 15.0 standard drinks of alcohol    Types: 14 Glasses of wine, 1 Standard drinks or equivalent per week    Comment: 2 glasses of wine with dinner, 1 martini every Friday night   Drug use: No   Sexual activity: Yes  Other Topics Concern   Not on file  Social History Narrative   Married to [place], has 1 son that lives in  [place]   Social Drivers of Health   Tobacco Use: Low Risk (09/10/2024)   Patient History    Smoking Tobacco Use: Never    Smokeless Tobacco Use: Never    Passive Exposure: Not on file  Financial Resource Strain: Low Risk (08/06/2024)   Overall Financial Resource Strain (CARDIA)    Difficulty of Paying Living Expenses: Not hard at all  Food Insecurity: No Food Insecurity (08/06/2024)   Epic    Worried About Radiation Protection Practitioner of Food in the Last Year: Never true    Ran Out of Food in the Last Year: Never true  Transportation Needs: No Transportation Needs (08/06/2024)   Epic    Lack of Transportation (Medical): No    Lack of Transportation (Non-Medical): No  Physical Activity: Sufficiently Active (08/06/2024)   Exercise Vital Sign    Days of Exercise per Week: 5 days    Minutes of Exercise per Session: 40 min  Stress: No Stress Concern Present (08/06/2024)   [employer] of Occupational Health - Occupational Stress Questionnaire    Feeling of Stress: Only a little  Social Connections: Socially Isolated (08/06/2024)   Social Connection and Isolation Panel    Frequency of Communication with Friends and Family: Once a week    Frequency of Social Gatherings with Friends and Family: Never    Attends Religious Services: Never    Database Administrator or Organizations: No    Attends  Banker Meetings: Never    Marital Status: Married  Catering Manager Violence: Not At Risk (08/10/2024)   Epic    Fear of Current or Ex-Partner: No    Emotionally Abused: No    Physically Abused: No    Sexually Abused: No  Depression (PHQ2-9): Low Risk (08/10/2024)   Depression (PHQ2-9)    PHQ-2 Score: 1  Alcohol Screen: Low Risk (08/06/2024)   Alcohol Screen    Last Alcohol Screening Score (AUDIT): 4  Housing: Unknown (08/06/2024)   Epic    Unable to Pay for Housing in the Last Year: No    Number of Times Moved in the Last Year: Not on file    Homeless in the Last Year: No  Utilities: Not At Risk (08/10/2024)   Epic    Threatened with loss of utilities: No  Health Literacy: Adequate Health Literacy (08/10/2024)   B1300 Health Literacy    Frequency of need for help with medical instructions: Never    ALLERGIES: Patient has no known allergies.  MEDICATIONS:  Current Outpatient Medications  Medication Sig Dispense Refill   aspirin  500 MG tablet Take 500 mg by mouth every 6 (six) hours as needed for pain.     aspirin  81 MG tablet Take 81 mg by mouth daily.     gabapentin  (NEURONTIN ) 100 MG capsule TAKE 1 CAPSULE BY MOUTH AT BEDTIME AS NEEDED 90 capsule 1   hydrocortisone  2.5 % cream Apply topically 3 (three) times a week. Apply to scaly areas on face 3 nights weekly, Tuesday, Thursday and Saturdays 30 g 6   lisinopril  (ZESTRIL ) 40 MG tablet Take 1 tablet by mouth once daily 90 tablet 3   Multiple Vitamin (MULTIVITAMIN WITH MINERALS) TABS Take 1 tablet by mouth daily.     rosuvastatin  (CRESTOR ) 40 MG tablet Take 1 tablet (40 mg total) by mouth daily. 90 tablet 1   No current facility-administered medications for this visit.    REVIEW OF SYSTEMS:  On review of systems, the patient reports that he is doing well  overall. He denies any chest pain, shortness of breath, cough, fevers, chills, night sweats, unintended weight changes. He denies any bowel disturbances, and denies  abdominal pain, nausea or vomiting. He denies any new musculoskeletal or joint aches or pains. His IPSS was 4, indicating minimal urinary symptoms. His SHIM was 25, indicating he does not have erectile dysfunction. A complete review of systems is obtained and is otherwise negative.    PHYSICAL EXAM:  Wt Readings from Last 3 Encounters:  09/10/24 226 lb (102.5 kg)  08/10/24 230 lb (104.3 kg)  07/15/24 239 lb 6.4 oz (108.6 kg)   Temp Readings from Last 3 Encounters:  09/10/24 (!) 96.4 F (35.8 C) (Temporal)  07/15/24 98 F (36.7 C) (Oral)  11/27/23 98.5 F (36.9 C)   BP Readings from Last 3 Encounters:  09/10/24 135/79  07/15/24 (!) 140/78  11/27/23 132/82   Pulse Readings from Last 3 Encounters:  09/10/24 (!) 59  07/15/24 64  11/27/23 64    /10  In general this is a well appearing Caucasian male in no acute distress. He's alert and oriented x4 and appropriate throughout the examination. Cardiopulmonary assessment is negative for acute distress, and he exhibits normal effort.     KPS = 100  100 - Normal; no complaints; no evidence of disease. 90   - Able to carry on normal activity; minor signs or symptoms of disease. 80   - Normal activity with effort; some signs or symptoms of disease. 56   - Cares for self; unable to carry on normal activity or to do active work. 60   - Requires occasional assistance, but is able to care for most of his personal needs. 50   - Requires considerable assistance and frequent medical care. 40   - Disabled; requires special care and assistance. 30   - Severely disabled; hospital admission is indicated although death not imminent. 20   - Very sick; hospital admission necessary; active supportive treatment necessary. 10   - Moribund; fatal processes progressing rapidly. 0     - Dead  Karnofsky DA, Abelmann WH, Craver LS and Burchenal JH [phone]) The use of the nitrogen mustards in the palliative treatment of carcinoma: with particular reference  to bronchogenic carcinoma Cancer 1 634-56  LABORATORY DATA:  Lab Results  Component Value Date   WBC 4.5 07/15/2024   HGB 15.0 07/15/2024   HCT 45.3 07/15/2024   MCV 93.6 07/15/2024   PLT 175 07/15/2024   Lab Results  Component Value Date   NA 141 07/15/2024   K 4.4 07/15/2024   CL 104 07/15/2024   CO2 26 07/15/2024   Lab Results  Component Value Date   ALT 31 07/15/2024   AST 28 07/15/2024   ALKPHOS 56 04/13/2021   BILITOT 0.5 07/15/2024     RADIOGRAPHY: NM PET (PSMA) SKULL TO MID THIGH Result Date: 01/05/2025 CLINICAL DATA:  Prostate cancer with rising PSA EXAM: NUCLEAR MEDICINE PET SKULL BASE TO THIGH TECHNIQUE: 8.3 mCi Flotufolastat (Posluma ) was injected intravenously. Full-ring PET imaging was performed from the skull base to thigh after the radiotracer. CT data was obtained and used for attenuation correction and anatomic localization. COMPARISON:  prostate MR 11/17/2024. Abdominopelvic CT of 06/16/2012. FINDINGS: NECK No tracer avid nodes. Incidental CT finding: No cervical adenopathy. Bilateral carotid atherosclerosis. CHEST No tracer avid pulmonary nodules. A right paratracheal node measures 6 mm and SUV 4.1 on image 47/6. Incidental CT finding: Mild cardiomegaly. Aortic and coronary artery calcification. ABDOMEN/PELVIS Prostate: Anterior right  prostatic base subcentimeter focus of tracer affinity at SUV 4.9. Posterolateral left mid to apical 12 mm focus of tracer affinity at SUV 4.4. Lymph nodes: Porta hepatis 7 mm node measures SUV 2.9 on image 93/6. This is similar in size to 06/16/2012. Abdominal retroperitoneal diminutive nodes with mild tracer affinity. Example retroaortic node of 7 mm and SUV 3.0 on image 110/6. Similar in size to 2013. Aortocaval node of 5 mm and SUV 3.0 on image 119/6. Similar in size to the prior. Liver: No evidence of liver metastasis. Incidental CT finding: Normal adrenal glands. Abdominal aortic atherosclerosis. Mild prostatomegaly. SKELETON No focal  activity to suggest skeletal metastasis. IMPRESSION: 1. Bilateral foci of tracer affinity within the prostate are suspicious for small volume primaries. 2. Diminutive abdominal nodes, unchanged in size back to 2013 with low-level tracer affinity. Favored to be benign/reactive. 3. A small mediastinal node with tracer affinity is favored to be benign/reactive. 4. Incidental findings, including: Coronary artery atherosclerosis. Aortic Atherosclerosis (ICD10-I70.0). Electronically Signed   By: Rockey Kilts M.D.   On: 01/05/2025 08:49      IMPRESSION/PLAN: 1. 74 y.o. gentleman with Stage T3a adenocarcinoma of the prostate with Gleason Score of 3+4, and PSA of 6.5. We discussed the patient's workup and outlined the nature of prostate cancer in this setting. The patient's T stage, Gleason's score, and PSA put him into the high risk group. Accordingly, he is eligible for a variety of potential treatment options including prostatectomy or ADT concurrent with either 8 weeks of external radiation or 5 weeks of daily external radiation with an up front brachytherapy boost. We discussed the available radiation techniques, and focused on the details and logistics of delivery. We discussed and outlined the risks, benefits, short and long-term effects associated with radiotherapy and compared and contrasted these with prostatectomy. We discussed the role of SpaceOAR gel in reducing the rectal toxicity associated with radiotherapy. We also detailed the role of ADT in the treatment of high risk prostate cancer and outlined the associated side effects that could be expected with this therapy.  He appears to have a good understanding of his disease and our treatment recommendations which are of curative intent.  He was encouraged to ask questions that were answered to his stated satisfaction.  At the conclusion of our conversation, the patient is undecided between robotic prostatectomy vs LT-ADT concurrent with seed boost and 5  weeks of daily external beam radiation. He would like to take some additional time to consider his options and has our contact information so that he can let us  know if he ultimately elects to proceed with radiation and we will move forward with treatment planning accordingly at that time. If he chooses radiation, we would want to start ADT now and then plan for the seed boost procedure in March 2026, followed by 5 weeks of daily external radiation which would begin approximately 3 weeks after the seed boost. We did discuss the option to have daily radiation treatments at the [facility] since it is closer to his home in [place] so he will give that some consideration as well. He knows that he is welcome to call with any questions or concerns. We enjoyed meeting him and his wife today and look forward to continuing to participate in his care.  2. Coronary artery and aortic atherosclerosis noted incidentally on PSMA PET imaging. Given these imaging findings coupled with his known history of hypertension and the recommendation for ADT as part of the management  plan for his prostate cancer, his cardiovascular risk profile should be assessed and optimized as clinically appropriate. Consideration of lipid management, blood pressure optimization, and possible cardiology input would be reasonable so we will share our discussion with his PCP, Dr. Marylynn, so that she can manage as deemed appropriate.    We personally spent 70 minutes in this encounter including chart review, reviewing radiological studies, meeting face-to-face with the patient, entering orders and completing documentation.    Sabra MICAEL Rusk, PA-C    Donnice Barge, MD  [facility]  Radiation Oncology Direct Dial: [phone]  Fax: [phone] [email or web address]  Skype  LinkedIn    "

## 2025-01-09 NOTE — Progress Notes (Signed)
 Introduced myself to the patient, and his wife, as the prostate nurse navigator. He is here to discuss his radiation treatment options. I provided patient with some written education and my direct contact information.  Patient remains undecided and is agreeable for follow up to ensure treatment decision is finalized. Patient knows to reach out with any questions or barriers that may arise.

## 2025-01-14 NOTE — Progress Notes (Signed)
 Patient has confirmed he would like to proceed with RALP under Dr. Renda.  MD notified.

## 2025-01-20 ENCOUNTER — Encounter: Payer: Self-pay | Admitting: Internal Medicine

## 2025-01-20 ENCOUNTER — Ambulatory Visit: Admitting: Internal Medicine

## 2025-01-20 VITALS — BP 128/78 | HR 59 | Temp 97.3°F | Ht 72.0 in | Wt 240.2 lb

## 2025-01-20 DIAGNOSIS — Z01818 Encounter for other preprocedural examination: Secondary | ICD-10-CM | POA: Insufficient documentation

## 2025-01-20 DIAGNOSIS — C61 Malignant neoplasm of prostate: Secondary | ICD-10-CM

## 2025-01-20 DIAGNOSIS — R5383 Other fatigue: Secondary | ICD-10-CM

## 2025-01-20 DIAGNOSIS — E785 Hyperlipidemia, unspecified: Secondary | ICD-10-CM

## 2025-01-20 DIAGNOSIS — I10 Essential (primary) hypertension: Secondary | ICD-10-CM

## 2025-01-20 DIAGNOSIS — M545 Low back pain, unspecified: Secondary | ICD-10-CM

## 2025-01-20 DIAGNOSIS — R7301 Impaired fasting glucose: Secondary | ICD-10-CM

## 2025-01-20 MED ORDER — ROSUVASTATIN CALCIUM 40 MG PO TABS: 40.0000 mg | ORAL_TABLET | Freq: Every day | ORAL | 1 refills | Status: AC

## 2025-01-20 MED ORDER — GABAPENTIN 100 MG PO CAPS: 100.0000 mg | ORAL_CAPSULE | Freq: Every evening | ORAL | 1 refills | Status: AC | PRN

## 2025-01-20 NOTE — Assessment & Plan Note (Signed)
 Patient has decided to move forward with prostatectomy to manage newly diagnosed prostate CA.  I have ordered and reviewed a 12 lead EKG and find that there are no acute changes and patient is in sinus rhythm.   Chest x ray, CBC  A1c and CMET ordered

## 2025-01-20 NOTE — Patient Instructions (Addendum)
" °  1) The  goals for optimal blood pressure management are to keep BP < 130/80 most of the time. .  Please check your blood pressure 5 few times at home over the next several weeks and send me the readings so I can determine if you need a change in medication    2) we have started  a preoperative evaluation today for your upcoming prostatectomy. Please get your chest x ray over at [facility] or at the [facility] in [facility],, no appointment is needed   Consider using Relaxium for insomnia  (as seen on TV commercials) . It is available through [employer] and contains all natural supplements:  Melatonin 5 mg  Chamomile 25 mg Passionflower extract 75 mg GABA 100 mg Ashwaganda extract 125 mg Magnesium  citrate, glycinate, oxide (100 mg)  L tryptophan 500 mg Valerest (proprietary  ingredient ; probably valeria root extract)   "

## 2025-01-20 NOTE — Assessment & Plan Note (Signed)
 he reports compliance with medication regimen  but has an elevated reading today in office. he is not using NSAIDs daily.  Home readings have been as low was 128/78.  Discussed goal of  (130/80 for patients over 70)  to preserve renal function. e has been asked to check her  BP  at home and  submit readings for evaluation. Renal function, electrolytes and screen for proteinuria are all normal .

## 2025-01-20 NOTE — Progress Notes (Signed)
 "  Subjective:  Patient ID: Ronald Hunter, male    DOB: 07/08/1951  Age: 74 y.o. MRN: [id number]  CC: The primary encounter diagnosis was Preoperative evaluation to rule out surgical contraindication. Diagnoses of Hyperlipidemia, unspecified hyperlipidemia type, Essential hypertension, Impaired fasting glucose, Other fatigue, Chronic low back pain without sciatica, unspecified back pain laterality, and Malignant neoplasm of prostate (HCC) were also pertinent to this visit.   HPI ERVING SASSANO presents for  Chief Complaint  Patient presents with   Medical Management of Chronic Issues    6 month f/u    1) Hypertension: patient checks blood pressure twice weekly at home.  Readings have been for the most part >130/80 at rest . Patient is following a reduced salt diet most days and is taking lisinopril  40 mg daily   2) Prostate Cancer: diagnosed in November with biopsies by Gretel Ferrara,  referrred to oncology donnice Cohens.  As of jan 22 patient was undecided  between the choice of   robotic prostatectomy vs LT-ADT concurrent with seed boost and 5 weeks of daily external beam radiation.   Patient states that he and his wife, Rhoda have decided to have the robotic prostatectomy .  He is awaiting scheduling of PT and surgery   3) Insomnia:  I don't sleep well since retirement. Trouble initiating and maintaining  at times.  Declines medication . Aggravated by neuropathy affecting feet. Using gabapentin  100 mg at bedtime , does not want to increase dose.  \ Lab Results  Component Value Date   PSA 4.48 (H) 07/15/2024   PSA 3.47 11/06/2021   PSA 2.91 10/19/2020      Outpatient Medications Prior to Visit  Medication Sig Dispense Refill   aspirin  81 MG tablet Take 81 mg by mouth daily.     hydrocortisone  2.5 % cream Apply topically 3 (three) times a week. Apply to scaly areas on face 3 nights weekly, Tuesday, Thursday and Saturdays 30 g 6   lisinopril  (ZESTRIL ) 40 MG tablet Take 1 tablet by  mouth once daily 90 tablet 3   Multiple Vitamin (MULTIVITAMIN WITH MINERALS) TABS Take 1 tablet by mouth daily.     gabapentin  (NEURONTIN ) 100 MG capsule TAKE 1 CAPSULE BY MOUTH AT BEDTIME AS NEEDED 90 capsule 1   rosuvastatin  (CRESTOR ) 40 MG tablet Take 1 tablet (40 mg total) by mouth daily. 90 tablet 1   No facility-administered medications prior to visit.    Review of Systems;  Patient denies headache, fevers, malaise, unintentional weight loss, skin rash, eye pain, sinus congestion and sinus pain, sore throat, dysphagia,  hemoptysis , cough, dyspnea, wheezing, chest pain, palpitations, orthopnea, edema, abdominal pain, nausea, melena, diarrhea, constipation, flank pain, dysuria, hematuria, urinary  Frequency, nocturia, numbness, tingling, seizures,  Focal weakness, Loss of consciousness,  Tremor, insomnia, depression, anxiety, and suicidal ideation.      Objective:  BP 128/78 (Cuff Size: Normal)   Pulse (!) 59   Temp (!) 97.3 F (36.3 C)   Ht 6' (1.829 m)   Wt 240 lb 3.2 oz (109 kg)   SpO2 97%   BMI 32.58 kg/m   BP Readings from Last 3 Encounters:  01/20/25 128/78  01/07/25 (!) 181/84  09/10/24 135/79    Wt Readings from Last 3 Encounters:  01/20/25 240 lb 3.2 oz (109 kg)  01/07/25 240 lb 12.8 oz (109.2 kg)  09/10/24 226 lb (102.5 kg)    Physical Exam Vitals reviewed.  Constitutional:      General:  He is not in acute distress.    Appearance: Normal appearance. He is normal weight. He is not ill-appearing, toxic-appearing or diaphoretic.  HENT:     Head: Normocephalic and atraumatic.     Right Ear: Tympanic membrane, ear canal and external ear normal. There is no impacted cerumen.     Left Ear: Tympanic membrane, ear canal and external ear normal. There is no impacted cerumen.     Nose: Nose normal.     Mouth/Throat:     Mouth: Mucous membranes are moist.     Pharynx: Oropharynx is clear.  Eyes:     General: No scleral icterus.       Right eye: No discharge.         Left eye: No discharge.     Conjunctiva/sclera: Conjunctivae normal.  Neck:     Thyroid : No thyromegaly.     Vascular: No carotid bruit or JVD.  Cardiovascular:     Rate and Rhythm: Normal rate and regular rhythm.     Heart sounds: Normal heart sounds.  Pulmonary:     Effort: Pulmonary effort is normal. No respiratory distress.     Breath sounds: Normal breath sounds.  Abdominal:     General: Bowel sounds are normal.     Palpations: Abdomen is soft. There is no mass.     Tenderness: There is no abdominal tenderness. There is no guarding or rebound.  Musculoskeletal:        General: Normal range of motion.     Cervical back: Normal range of motion and neck supple.  Lymphadenopathy:     Cervical: No cervical adenopathy.  Skin:    General: Skin is warm and dry.  Neurological:     General: No focal deficit present.     Mental Status: He is alert and oriented to person, place, and time. Mental status is at baseline.  Psychiatric:        Mood and Affect: Mood normal.        Behavior: Behavior normal.        Thought Content: Thought content normal.        Judgment: Judgment normal.     Lab Results  Component Value Date   HGBA1C 5.7 (H) 07/15/2024   HGBA1C 5.5 04/13/2021   HGBA1C 5.6 04/22/2018    Lab Results  Component Value Date   CREATININE 0.62 (L) 07/15/2024   CREATININE 0.75 11/27/2023   CREATININE 0.72 05/23/2023    Lab Results  Component Value Date   WBC 4.5 07/15/2024   HGB 15.0 07/15/2024   HCT 45.3 07/15/2024   PLT 175 07/15/2024   GLUCOSE 89 07/15/2024   CHOL 170 07/15/2024   TRIG 86 07/15/2024   HDL 55 07/15/2024   LDLDIRECT 112 (H) 07/15/2024   LDLCALC 97 07/15/2024   ALT 31 07/15/2024   AST 28 07/15/2024   NA 141 07/15/2024   K 4.4 07/15/2024   CL 104 07/15/2024   CREATININE 0.62 (L) 07/15/2024   BUN 11 07/15/2024   CO2 26 07/15/2024   TSH 2.19 07/15/2024   PSA 4.48 (H) 07/15/2024   HGBA1C 5.7 (H) 07/15/2024   MICROALBUR 1.9 07/15/2024     No results found.  Assessment & Plan:   Problem List Items Addressed This Visit     Chronic low back pain (Chronic)   Relevant Medications   gabapentin  (NEURONTIN ) 100 MG capsule   Essential hypertension (Chronic)   he reports compliance with medication regimen  but has an elevated  reading today in office. he is not using NSAIDs daily.  Home readings have been as low was 128/78.  Discussed goal of  (130/80 for patients over 70)  to preserve renal function. e has been asked to check her  BP  at home and  submit readings for evaluation. Renal function, electrolytes and screen for proteinuria are all normal .      Relevant Medications   rosuvastatin  (CRESTOR ) 40 MG tablet   Other Relevant Orders   Comp Met (CMET)   Hyperlipidemia (Chronic)   Relevant Medications   rosuvastatin  (CRESTOR ) 40 MG tablet   Other Relevant Orders   Direct LDL   Lipid Profile   Malignant neoplasm of prostate (HCC)   Diagnosed by biopsy done Nov 2025 by Gretel Ferrara .  Patient has decided to proceed with prostatectomy.        Preoperative evaluation to rule out surgical contraindication - Primary   Patient has decided to move forward with prostatectomy to manage newly diagnosed prostate CA.  I have ordered and reviewed a 12 lead EKG and find that there are no acute changes and patient is in sinus rhythm.   Chest x ray, CBC  A1c and CMET ordered       Relevant Orders   EKG 12-Lead (Completed)   DG Chest 2 View   Other Visit Diagnoses       Impaired fasting glucose       Relevant Orders   Comp Met (CMET)   Hemoglobin A1c     Other fatigue       Relevant Orders   CBC with Differential/Platelet          I spent 41 minutes on the day of this face to face encounter reviewing patient's  most recent visit with  urology , recent biopsy, prior relevant surgical and non surgical procedures, recent   imaging studies, counseling on hypertension  management,  reviewing the assessment and plan with  patient, and post visit ordering and reviewing of  diagnostics and therapeutics with patient  .   Follow-up: Return in about 6 months (around 07/20/2025).   Verneita LITTIE Kettering, MD "

## 2025-01-20 NOTE — Assessment & Plan Note (Signed)
 Diagnosed by biopsy done Nov 2025 by Gretel Ferrara .  Patient has decided to proceed with prostatectomy.

## 2025-01-20 NOTE — Addendum Note (Signed)
 Addended by: EMERY PRESIDENT D on: 01/20/2025 10:21 AM   Modules accepted: Orders

## 2025-01-21 LAB — CBC WITH DIFFERENTIAL/PLATELET
Absolute Lymphocytes: 1576 {cells}/uL (ref 850–3900)
Absolute Monocytes: 424 {cells}/uL (ref 200–950)
Basophils Absolute: 8 {cells}/uL (ref 0–200)
Basophils Relative: 0.2 %
Eosinophils Absolute: 68 {cells}/uL (ref 15–500)
Eosinophils Relative: 1.7 %
HCT: 45.1 % (ref 39.4–51.1)
Hemoglobin: 15.1 g/dL (ref 13.2–17.1)
MCH: 30.3 pg (ref 27.0–33.0)
MCHC: 33.5 g/dL (ref 31.6–35.4)
MCV: 90.6 fL (ref 81.4–101.7)
MPV: 10.7 fL (ref 7.5–12.5)
Monocytes Relative: 10.6 %
Neutro Abs: 1924 {cells}/uL (ref 1500–7800)
Neutrophils Relative %: 48.1 %
Platelets: 176 10*3/uL (ref 140–400)
RBC: 4.98 Million/uL (ref 4.20–5.80)
RDW: 13.2 % (ref 11.0–15.0)
Total Lymphocyte: 39.4 %
WBC: 4 10*3/uL (ref 3.8–10.8)

## 2025-01-21 LAB — LIPID PANEL
Cholesterol: 150 mg/dL
HDL: 48 mg/dL
LDL Cholesterol (Calc): 80 mg/dL
Non-HDL Cholesterol (Calc): 102 mg/dL
Total CHOL/HDL Ratio: 3.1 (calc)
Triglycerides: 120 mg/dL

## 2025-01-21 LAB — COMPREHENSIVE METABOLIC PANEL WITH GFR
AG Ratio: 2.2 (calc) (ref 1.0–2.5)
ALT: 29 U/L (ref 9–46)
AST: 25 U/L (ref 10–35)
Albumin: 4.7 g/dL (ref 3.6–5.1)
Alkaline phosphatase (APISO): 63 U/L (ref 35–144)
BUN/Creatinine Ratio: 15 (calc) (ref 6–22)
BUN: 10 mg/dL (ref 7–25)
CO2: 27 mmol/L (ref 20–32)
Calcium: 9.1 mg/dL (ref 8.6–10.3)
Chloride: 105 mmol/L (ref 98–110)
Creat: 0.66 mg/dL — ABNORMAL LOW (ref 0.70–1.28)
Globulin: 2.1 g/dL (ref 1.9–3.7)
Glucose, Bld: 100 mg/dL — ABNORMAL HIGH (ref 65–99)
Potassium: 4.7 mmol/L (ref 3.5–5.3)
Sodium: 141 mmol/L (ref 135–146)
Total Bilirubin: 0.6 mg/dL (ref 0.2–1.2)
Total Protein: 6.8 g/dL (ref 6.1–8.1)
eGFR: 100 mL/min/{1.73_m2}

## 2025-01-21 LAB — HEMOGLOBIN A1C
Hgb A1c MFr Bld: 5.6 %
Mean Plasma Glucose: 114 mg/dL
eAG (mmol/L): 6.3 mmol/L

## 2025-01-21 LAB — LDL CHOLESTEROL, DIRECT: Direct LDL: 108 mg/dL — ABNORMAL HIGH

## 2025-06-03 ENCOUNTER — Ambulatory Visit: Admitting: Dermatology

## 2025-08-13 ENCOUNTER — Ambulatory Visit
